# Patient Record
Sex: Male | Born: 1945 | Race: White | Hispanic: No | Smoking: Former smoker
Health system: Southern US, Community
[De-identification: ages and names within clinical notes are randomized; demographics above are authoritative.]

## PROBLEM LIST (undated history)

## (undated) DIAGNOSIS — F319 Bipolar disorder, unspecified: Secondary | ICD-10-CM

## (undated) DIAGNOSIS — G939 Disorder of brain, unspecified: Secondary | ICD-10-CM

## (undated) DIAGNOSIS — G4700 Insomnia, unspecified: Secondary | ICD-10-CM

## (undated) DIAGNOSIS — F8081 Childhood onset fluency disorder: Secondary | ICD-10-CM

## (undated) DIAGNOSIS — E039 Hypothyroidism, unspecified: Secondary | ICD-10-CM

## (undated) DIAGNOSIS — F1027 Alcohol dependence with alcohol-induced persisting dementia: Secondary | ICD-10-CM

## (undated) DIAGNOSIS — R251 Tremor, unspecified: Secondary | ICD-10-CM

## (undated) DIAGNOSIS — E890 Postprocedural hypothyroidism: Secondary | ICD-10-CM

## (undated) DIAGNOSIS — G40909 Epilepsy, unspecified, not intractable, without status epilepticus: Secondary | ICD-10-CM

## (undated) DIAGNOSIS — E512 Wernicke's encephalopathy: Secondary | ICD-10-CM

## (undated) HISTORY — DX: Epilepsy, unspecified, not intractable, without status epilepticus: G40.909

## (undated) HISTORY — DX: Disorder of brain, unspecified: G93.9

## (undated) HISTORY — PX: NO PAST SURGERIES: SHX2092

## (undated) HISTORY — DX: Wernicke's encephalopathy: E51.2

## (undated) HISTORY — DX: Postprocedural hypothyroidism: E89.0

## (undated) HISTORY — DX: Alcohol dependence with alcohol-induced persisting dementia: F10.27

## (undated) HISTORY — DX: Hypothyroidism, unspecified: E03.9

## (undated) HISTORY — DX: Insomnia, unspecified: G47.00

## (undated) HISTORY — DX: Tremor, unspecified: R25.1

## (undated) HISTORY — DX: Bipolar disorder, unspecified: F31.9

## (undated) HISTORY — DX: Childhood onset fluency disorder: F80.81

---

## 2007-09-03 ENCOUNTER — Inpatient Hospital Stay (HOSPITAL_COMMUNITY): Admission: EM | Admit: 2007-09-03 | Discharge: 2007-09-04 | Payer: Self-pay | Admitting: Emergency Medicine

## 2007-09-04 ENCOUNTER — Encounter (INDEPENDENT_AMBULATORY_CARE_PROVIDER_SITE_OTHER): Payer: Self-pay | Admitting: Gastroenterology

## 2007-09-13 ENCOUNTER — Inpatient Hospital Stay (HOSPITAL_COMMUNITY): Admission: EM | Admit: 2007-09-13 | Discharge: 2007-09-16 | Payer: Self-pay | Admitting: Emergency Medicine

## 2007-09-13 ENCOUNTER — Ambulatory Visit: Payer: Self-pay | Admitting: Internal Medicine

## 2007-09-16 ENCOUNTER — Ambulatory Visit: Payer: Self-pay | Admitting: Psychiatry

## 2007-09-20 ENCOUNTER — Emergency Department (HOSPITAL_COMMUNITY): Admission: EM | Admit: 2007-09-20 | Discharge: 2007-09-20 | Payer: Self-pay | Admitting: Emergency Medicine

## 2007-10-09 ENCOUNTER — Emergency Department (HOSPITAL_COMMUNITY): Admission: EM | Admit: 2007-10-09 | Discharge: 2007-10-10 | Payer: Self-pay | Admitting: Emergency Medicine

## 2010-04-20 ENCOUNTER — Emergency Department (HOSPITAL_COMMUNITY)
Admission: EM | Admit: 2010-04-20 | Discharge: 2010-04-20 | Payer: Self-pay | Source: Home / Self Care | Admitting: Emergency Medicine

## 2010-07-16 LAB — POCT I-STAT, CHEM 8
BUN: 15 mg/dL (ref 6–23)
Calcium, Ion: 1.17 mmol/L (ref 1.12–1.32)
Chloride: 100 mEq/L (ref 96–112)
Glucose, Bld: 79 mg/dL (ref 70–99)
HCT: 37 % — ABNORMAL LOW (ref 39.0–52.0)
TCO2: 25 mmol/L (ref 0–100)

## 2010-07-16 LAB — DIFFERENTIAL
Basophils Absolute: 0 10*3/uL (ref 0.0–0.1)
Eosinophils Relative: 0 % (ref 0–5)
Lymphocytes Relative: 22 % (ref 12–46)
Neutrophils Relative %: 65 % (ref 43–77)

## 2010-07-16 LAB — RAPID URINE DRUG SCREEN, HOSP PERFORMED
Barbiturates: NOT DETECTED
Benzodiazepines: NOT DETECTED
Cocaine: NOT DETECTED
Opiates: NOT DETECTED

## 2010-07-16 LAB — POCT CARDIAC MARKERS: Troponin i, poc: <0.05 ng/mL (ref 0.00–0.09)

## 2010-07-16 LAB — CBC
MCV: 92.9 fL (ref 78.0–100.0)
Platelets: 261 10*3/uL (ref 150–400)
RBC: 3.8 MIL/uL — ABNORMAL LOW (ref 4.22–5.81)
RDW: 12.6 % (ref 11.5–15.5)
WBC: 7 10*3/uL (ref 4.0–10.5)

## 2010-07-16 LAB — URINALYSIS, ROUTINE W REFLEX MICROSCOPIC
Glucose, UA: NEGATIVE mg/dL
pH: 8.5 — ABNORMAL HIGH (ref 5.0–8.0)

## 2010-07-16 LAB — HEPATIC FUNCTION PANEL
AST: 26 U/L (ref 0–37)
Albumin: 3.5 g/dL (ref 3.5–5.2)

## 2010-07-16 LAB — PROTIME-INR
INR: 1.06 (ref 0.00–1.49)
Prothrombin Time: 14 seconds (ref 11.6–15.2)

## 2010-07-16 LAB — ETHANOL: Alcohol, Ethyl (B): <5 mg/dL (ref 0–10)

## 2010-07-16 LAB — VALPROIC ACID LEVEL: Valproic Acid Lvl: 67 ug/mL (ref 50.0–100.0)

## 2010-09-18 NOTE — Discharge Summary (Signed)
NAME:  MORAD, TAL NO.:  [id number]   MEDICAL RECORD NO.:  [id number]          PATIENT TYPE:  INP   LOCATION:  3735                         FACILITY:  [facility]   PHYSICIAN:  Alvester Morin, M.D.  DATE OF BIRTH:  1946-05-06   DATE OF ADMISSION:  09/13/2007  DATE OF DISCHARGE:  09/16/2007                               DISCHARGE SUMMARY   DISCHARGE DIAGNOSES:  1. Seizure.  2. History of bipolar disorder, quiescent.  3. History of alcohol abuse.  4. 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  5. Diarrhea, chronic, likely secondary to pancreatic insufficiency.  6. Hypertension.  7. Hyperlipidemia.  8. Insomnia.   DISCHARGE MEDICATIONS:  1. Aspirin 81 mg daily.  2. Niacin 250 mg by mouth 1 today.  3. Seroquel 50 mg bedtime.  4. Pancrease MT 10 t.i.d. with meals.   DISPOSITION:  The patient will followup on October 08, 2007, at 1:30 p.m.  with Dr. Aida Puffer.   PROCEDURES PERFORMED:  1.EEG: normal in awake, drowsy and light sleep  states.  2.MRI of the brain showed no evidence of acute process and no specific  lesion to explain seizure. Chronic small vessel type changes.  3.CT scan of the head wasnegative for bleed with some atrophy and  nonspecific white matter changes.   CONSULTANTS:  Psychiatry, Anselm Jungling, MD.   BRIEF ADMITTING H&P:  Mr Heidrick is a 65 year old man with past  medical history of alcoholism with Wernicke's encephalopathy, bipolar  disorder and isolated seizure about 6 years prior to admission. He  presents with seizure activity witnessed by his son and daughter-in-law.  For the past 2 weeks, his family notes that he has been very talkative  and highly energetic.  The day of admission, he started stuttering and  was unintelligible.  He then started having jerking movements of his  limbs with loss of consciousness for 5 minutes. The patient is unable to  give any further history. The family denies recent illnesses, medication  changes, substance abuse, trauma.   PHYSICAL EXAMINATION:  VITAL SIGNS:  Temperature is 97.3, blood pressure  156/76, pulse 86, respirations 18, and he is satting 96% on room air.  GENERAL:  He is alert but disoriented.  NECK:  Supple.  LUNGS:  Good air movement and clear to auscultation.  HEART:  Regular rate and rhythm with no murmur, rubs, or gallop with  normal S1 and S2.  GI:  Nontender, nondistended, soft, and positive bowel sounds.  EXTREMITIES:  He has no edema.  SKIN:  Some sunburn.  PSYCH:  Very pleasant, smiling, but disoriented  NEURO:  Alert, pleasant, disoriented to person, place and time. Unable  to answer questions due to memory impairment. CN's II through XII  grossly intact.  Muscle strength 5/5 bilateral and symmetrical.  Deep  tendon reflex 2+.  Sensation is intact to light touch.  Finger-to-nose  was normal.   LABORATORY DATA:  On the day of admission, point-of-care cardiac care  markers were negative.  His sodium was 133, potassium 3.8, chloride 99,  bicarb 22, BUN 9, creatinine 0.9, and glucose 109.  He  has a gap of 10.  Bilirubin 0.9, alkaline phosphatase is 54, AST 28, ALT 22, protein 6.8,  and albumin 3.9.  White blood cells 11.5, ANC 8.6, hemoglobin 12.9, and  MCV 89.1.  Chest x-ray showed no acute process.   BRIEF HOSPITAL COURSE:  1. Seizure. The patient was admitted to the telemetry unit.  CT scan      was obtained that showed results as above.  Because of high      probability for any kind of stroke, an MRI was obtained that showed      results to be negative with results as above.  EEG was obtained and      was normal.  There were no electrolyte abnormalities and no signs      of infection.  No signs of arrhythmia by telemetry. He had no      further seizure activity during the hospitalizaion. Therefore, it      was decided not to start an antiepileptic at this time. The patient      will follow up with his PCP.  2. History of bipolar disorder,  quiescent.  Psychiatry was consulted      and felt that the patient's bipolar disorder was quiescent at this      time. He did not recommend any medications for bipolar disorder.  3. Insomnia. The patient has difficulty with sleep. Therefore, at the      recommendation of psychiatry, Seroquel was started at bedtime.  4. History of alcohol abuse with Wernicke's encephalopathy. The      patient is no longer drinking. However, he has severely impaired      cognitive function and memory loss.  5. Low HDL.  Niacin was started.  This will be increased by his PCP.  6. Diarrhea, chronic.  This was thought to be secondary to pancreatic      insufficiency from alcohol use.  He was started on Pancrease and      his diarrhea improved.  Giardia and ova and parasites are pending      at this time.  He had previous treatment with Flagyl for Giardia      but his diarrhea has persisted.  7. Hypertension.  No blood pressure medication was started during his      hospital stay.  His highest blood pressure in the hospital was 145      on one occasion.  This can be followed as an outpatient.   DISCHARGE VITAL SIGNS:  On the day of discharge, his temperature was  98.4, blood pressure 125/70, pulse 74, respirations 18, and oxygen  saturation was 99% on room air.   DISCHARGE LABORATORY DATA:  Labs on the day of discharge showed sodium  135, potassium 4.5, chloride 101, bicarb 27, glucose 104, BUN 3,  creatinine 0.9, calcium 9.0.  White blood cells 8.9, hemoglobin of 12.2,  and platelets of 318.      Marinda Elk, M.D.  Electronically Signed      Alvester Morin, M.D.  Electronically Signed    AF/MEDQ  D:  09/16/2007  T:  09/17/2007  Job:  [id number]   cc:   Aida Puffer

## 2010-09-18 NOTE — Consult Note (Signed)
NAME:  TIRAS, BIANCHINI NO.:  [id number]   MEDICAL RECORD NO.:  [id number]          PATIENT TYPE:  INP   LOCATION:  3735                         FACILITY:  [facility]   PHYSICIAN:  Anselm Jungling, MD  DATE OF BIRTH:  [birth year]   DATE OF CONSULTATION:  09/16/2007  DATE OF DISCHARGE:                                 CONSULTATION   IDENTIFYING DATA AND REASON FOR REFERRAL:  Dr. Corrie Dandy Phifer asked that I  see this 65 year old divorced white gentleman whom is she is treating  here at [facility] due to seizures.  He has a psychiatric  history and a history of alcoholism.  Psychiatric consultation is  requested to assess mental status and make recommendations.   HISTORY OF THE PRESENTING PROBLEMS:  The following information is  obtained from Dr. Harvie Junior, the [facility] chart, the patient, and his  son, Thayer Ohm, and his with wife, Inetta Fermo, who are powers of attorney.   The patient is 65 years old and lives currently in [place] with [place]  and [place].  He has been separated for several years.  He is alcohol-  dependent, but not drinking for several years now.  Nonetheless, he  appears to have evidence of Wernicke's encephalopathy, with CT imaging  showing generalized atrophy, and nonspecific white matter changes.  For  this reason, he has a power of attorney.  His son and daughter-in-law  have been assisting him around issues pertaining to divorce, finances,  and providing him a place to live.  He has severely impaired cognition  and memory.   The patient has been under the care of Dr. Clarene Duke, a family doctor, for  the past 6 to 7 months since the patient has been living in [place]  with his son.  He is currently on Zoloft 100 mg daily.   In addition to his alcohol related problems and dependence, he also has  a history of diagnosis of bipolar disorder.  When he first came to stay  with his son and daughter-in-law, he was on numerous medications, and  was quite  obtunded much of the time, sleeping as much as 23 hours per  day.  One of these medications was Depakote, and it was can discontinued  8 months ago.  Following the discontinuation of Depakote, he has been  functioning a much higher level.  He has been more active, he has lost  excess weight, and his mood has been very good.  He is always smiling.  He does have some problems sleeping, and when he gets particularly  tired, he goes through episodes where he is mildly agitated and has  episodes of stuttering.  Also, since the discontinuation of Depakote,  there has been more in the way of seizure activity, which was the basis  for this admission.  According to his children, the seizures began  approximately 6 to 7 years ago, around the time that he began drinking  again, after having been sober for about 4 years.   His last inpatient psychiatric stay was 6 years ago, in the state of  [place].  That  stay was also associated with the onset of seizures.   Recently, difficulty with sleep, and insomnia has been the most  consistent difficulty that they have seen with the patient.   MENTAL STATUS AND OBSERVATIONS:  The patient is a well-nourished,  normally-developed adult male who is alert, awake, but disoriented.  He  is very pleasant, friendly, and smiles most the time that we are  conducting the interview.  He indicates that he prefers that his son and  daughter-in-law be present, and he actually indicates he prefers that  they answer for him, since he has difficulty with informational  questions anyway.  He did not make any delusional statements.  He  appears to be in quite good spirits, and very good natured.  He clearly  lacks mental capacity for medical decision making.   IMPRESSION:  The patient is a 65 year old divorced male with a history  of chronic alcoholism, Wernicke's encephalopathy, and seizures.  He also  has a history of bipolar disorder.   At this present time, he appears to  be doing well with respect to any  mood related issues on his current regimen of Zoloft 100 mg daily.  There does not appear to be any indication for any anti-manic, mood-  stabilizing, or antipsychotic medication at this time.  He does have  ongoing problems with insomnia, and when he does not get enough sleep,  he does not function at as high a level.   DIAGNOSTIC IMPRESSION:  AXIS I:  1. Alcohol dependence, in remission.  2. Wernicke's encephalopathy.  3. History of bipolar disorder NOS.  AXIS II:  Deferred.  AXIS III:  History of seizures.  AXIS IV:  Stressors severe.  AXIS V:  GAF 25.   RECOMMENDATIONS:  I agree completely with his current management here at  [facility].  Prior to discharge, his physician may want to  consider the possibility of a trial of medication to assist in  stabilizing sleep.  Medications to consider would be low-dose Risperdal,  low-dose Seroquel, and, if the patient is to be on any ongoing  anticonvulsant therapy, it would be possibly serendipitous for that  medication to be given all at bedtime to assist with sleep as well.   The patient will continue to be followed by Dr. Clarene Duke in [place]  area.  I discussed with the patient and his children the fact that,  although his bipolar disorder appears to be quiescent at this time,  there is always the possibility that it could become more active again,  at which time consultation with a psychiatrist would be advisable.  They  agreed with this.   Thank you for involving me in this patient's care.  I have no other  specific recommendations at this time.  Cell phone [phone].      Anselm Jungling, MD  Electronically Signed     SPB/MEDQ  D:  09/16/2007  T:  09/16/2007  Job:  [id number]

## 2010-09-18 NOTE — H&P (Signed)
NAME:  Trevor Cook, Trevor Cook NO.:  [id number]   MEDICAL RECORD NO.:  [id number]           PATIENT TYPE:   LOCATION:                                 FACILITY:   PHYSICIAN:  Shirley Friar, MDDATE OF BIRTH:  [birth year]   DATE OF ADMISSION:  DATE OF DISCHARGE:                              HISTORY & PHYSICAL   PROBLEM:  GI bleed.   HISTORY OF PRESENT ILLNESS:  Mr. Trevor Cook is a 65 year old white male  with a history of mental retardation who saw his primary care doctor,  Dr. Aida Puffer today with his caretaker who is the daughter-in-law,  Charlee Whitebread.  He has been having black-colored diarrhea for the last  10 days without any associated nausea, vomiting, or abdominal pain.  He  has no previous history of ulcers or GI bleeding in the past.  He is  hemodynamically stable with hemoglobin of 14.6 at Dr. Fredirick Maudlin office  and was sent down to [facility] for further evaluation.  The  patient and family denied any NSAID use.  No history of any bright red  blood per rectum.   PAST MEDICAL HISTORY:  1. History of depression.  2. Hyperlipidemia.  3. Mental retardation.   MEDICATIONS:  1. Vitamin D 50,000 units once a week.  2. Sertraline 100 mg p.o. daily.  3. Lipitor 10 mg p.o. daily.  4. Depakote 500 mg nightly.  5. Mirtazapine 30 mg p.o. daily.   ALLERGIES:  No known drug allergies.   FAMILY HISTORY:  Noncontributory.   SOCIAL HISTORY:  Former smoker, denies alcohol.   REVIEW OF SYSTEMS:  Negative from the GI standpoint except as stated  above.   PHYSICAL EXAMINATION:  VITAL SIGNS: Temperature 98.1, pulse 89, blood  pressure 133/104, on recheck 155/97, O2 sat 97% on room air.  GENERAL: Alert, in no acute distress.  ABDOMEN: Benign.  RECTUM: Dark brown stool on rectal check.   LABORATORY DATA:  White blood count 11.2, hemoglobin 15.4, and platelet  count 330,000.  Sodium 135, potassium 3.6, chloride 102, CO2 of 18,  glucose 113, BUN 23, and  creatinine 1.08.  Liver tests within normal  limits.   IMPRESSION:  A 65 year old white male with 10 days of reported black-  colored diarrhea concerning for upper gastrointestinal source.  The  patient will be admitted to the hospital for observation and will  undergo upper endoscopy in the a.m. to look for evidence of peptic ulcer  disease.  The patient will be on clear liquid diet, n.p.o. at the  midnight, and will be on IV Protonix 40 mg IV q. 12 hours.      Shirley Friar, MD  Electronically Signed     VCS/MEDQ  D:  09/03/2007  T:  09/04/2007  Job:  [id number]   cc:   Aida Puffer

## 2010-09-18 NOTE — Op Note (Signed)
NAME:  Trevor Cook, READING NO.:  [id number]   MEDICAL RECORD NO.:  [id number]           PATIENT TYPE:   LOCATION:                                 FACILITY:   PHYSICIAN:  Graylin Shiver, M.D.   DATE OF BIRTH:  December 04, 1945   DATE OF PROCEDURE:  DATE OF DISCHARGE:                               OPERATIVE REPORT   INDICATIONS FOR PROCEDURE:  Black heme-positive stool.   Informed consent was obtained after explanation of the risks of  bleeding, infection, and perforation.   PREMEDICATIONS:  1. Fentanyl 75 mcg IV.  2. Versed 7.5 mg IV.   PROCEDURE:  With the patient in the left lateral decubitus position, the  Pentax gastroscope was inserted into the oropharynx and passed into the  esophagus.  It was advanced down the esophagus then into the stomach and  into the duodenum.  The second portion of the duodenum looked normal.  In the bulb of the duodenum just inside the rim of the pyloric channel  on the bulb side, there were some nodules noted.  The etiology of these  are unclear.  I am not sure if this represent hypertrophied Brunner  glands or some other pathology.  Biopsies were obtained.  The body and  antrum of the stomach looked normal.  The fundus of the stomach looked  normal.  In the distal esophagus, there was 1 superior projection of  columnar-appearing mucosa coming up off the EG junction which was  biopsied to rule out Barrett.  The esophagus looked normal otherwise.  He tolerated the procedure well without complications.   IMPRESSION:  1. Rule out Barrett esophagus, biopsy pending.  2. Nodules in duodenum as described above, etiology unclear, biopsies      pending.  3. No evidence of active bleeding or findings to suggest a source for      bleeding.   PLAN:  The patient is going to be discharged today.  I will have him  follow up with Dr. Bosie Clos next week.  He should have a colonoscopy.  This can be done as an outpatient.     ______________________________  Graylin Shiver, M.D.     SFG/MEDQ  D:  09/04/2007  T:  09/05/2007  Job:  [id number]   cc:   Shirley Friar, MD

## 2010-09-18 NOTE — Procedures (Signed)
EEG NUMBER:  [id number].   REQUESTING PHYSICIAN:  Alvester Morin, MD   CLINICAL HISTORY:  A 65 year old man admitted with new-onset seizure.  EEG is performed for evaluation.  This is routine EEG done with photic  stimulation, but not hyperventilation.  The patient is described as  awake and drowsy.   DESCRIPTION:  The dominant rhythm in this tracing is a moderate  amplitude alpha rhythm of 10 Hz, which predominates posteriorly, appears  without abnormal asymmetry, and attenuates with eye opening and closing.  Abundant low-amplitude fast activity along with some muscle artifact is  seen in the frontal and central regions bilaterally.  No focal slowing  is noted, and no epileptiform discharges are seen.  Drowsiness occurs  naturally as evidenced by fragmentation in the background of generalized  attenuation of rhythms.  Rarely, sleep spindles are seen, but the  patient never stays in sleep stage long enough to achieve clear stage II  sleep.  Numerous arousals from light sleep are noted, usually related to  movement.  Photic stimulation did not produce significant driving  responses.  Hyperventilation was not performed.  Single-channel devoted  EKG revealed sinus rhythm throughout with a rate of approximately 72  beats per minute.   CONCLUSIONS:  Normal study in awake, drowsy, and light sleep states.      Michael L. Thad Ranger, M.D.  Electronically Signed     ZOX:WRUE  D:  09/15/2007 17:12:02  T:  09/16/2007 05:23:11  Job #:  [id number]

## 2010-09-18 NOTE — Consult Note (Signed)
NAME:  Trevor Cook, Trevor Cook NO.:  [id number]   MEDICAL RECORD NO.:  [id number]          PATIENT TYPE:  EMS   LOCATION:  MAJO                         FACILITY:  [facility]   PHYSICIAN:  Herbie Saxon, MDDATE OF BIRTH:  November 26, 1945   DATE OF CONSULTATION:  10/09/2007  DATE OF DISCHARGE:  10/09/2007                                 CONSULTATION   PRIMARY CARE PHYSICIAN:  Dr. Aida Puffer.   REASON FOR CONSULTATION:  Medical evaluation.   PRESENTING COMPLAINT:  Agitation for 1 day.   HISTORY OF PRESENTING COMPLAINT:  This is a 65 year old male with  history of bipolar disorder, dementia, seizure disorder, who was brought  into the emergency unit, as staff had noticed that he has been having  rapid speech, jumping from 1 subject to another, and experiencing flight  of ideas.  The patient at present does not complain of any particular  symptoms.   PAST MEDICAL HISTORY:  As previously stated.   PAST SURGICAL HISTORY:  Fixation of right index finger many years ago.   SOCIAL HISTORY:  He is divorced.  He has 1 son as per him.  He gives a  history of heavy alcohol intake, a case of Budweiser daily for more than  40 years.  He also smokes heavily, 1 pack per day for more than 40  years.  He denies any use of illicit 4rugs.   FAMILY HISTORY:  Mother, diabetes.  Father, coronary artery disease.   ALLERGIES:  No known drug allergies.   MEDICATIONS:  Aspirin 81 mg daily, Amitriptyline, Keflex, Lasix, and  Depakote.   PHYSICAL EXAMINATION:  GENERAL:  He is an elderly man, not in acute  respiratory distress.  VITAL SIGNS:  Temperature is 97, pulse rate of 70.  After evaluation in  the emergency room, blood pressure was 146/110.  HEENT:  Pupils equal and reactive to light and accommodation.  Extraocular muscles are intact.  NECK:  Supple.  CHEST:  Clinically clear.  HEART:  Heart sounds 1 and 2, regular rate and rhythm.  ABDOMEN:  Benign.  JYN:WGNFA edema 2+.   Peripheral pulse is reduced.  OZH:YQMVHQI normal   ASSESSMENT:  1. Manic episode.  2. Hyponitremia.  3. Hypertension.  4. History of alcohol abuse.  5. History of anoxic encephalopathy.  6. History of seizure disorder.  7. Bipolar disorder.  8. History of dementia.   IV fluids, normal saline at 80 mL an hour with p.o. fluid restriction.  We will obtain a CT of brain, urine sodium level, urinalysis, serum uric  acid, serum osmolality, chest x-ray.  Start him on metoprolol 25 mg  daily, clonidine 0.1 mg daily p.r.n.  Continue with his amitriptyline 25  mg daily, Depakote 500 mg daily.  Check his Depakote level.  Given  seizure, fall, and aspiration precaution.  Continue his aspirin 81 mg  daily.  The patient will benefit from transfer to inpatient Psych Unit,  before had been Geriatric Psych Unit 2 months ago .  Ativan 2 mg IV q.6  h. p.r.n.  Put him on Thiamine 100 mg daily.  Note that  his urine  culture is positive.   LABORATORY DATA:  WBC 11.7, hematocrit 33, and platelet count of 380.  Sodium was 125, potassium 3.5, chloride 87, BUN 9, and creatinine 0.9.  Alcohol level less than 5.  Valproic acid level is 9.3.   Thanks for this consult.  We will follow with you.      Herbie Saxon, MD  Electronically Signed     MIO/MEDQ  D:  10/10/2007  T:  10/10/2007  Job:  [id number]   cc:   Aida Puffer

## 2011-01-29 LAB — COMPREHENSIVE METABOLIC PANEL
AST: 25
Albumin: 3.9
CO2: 18 — ABNORMAL LOW
Calcium: 9.5
Creatinine, Ser: 1.08
GFR calc Af Amer: >60
GFR calc non Af Amer: >60

## 2011-01-29 LAB — CBC
MCHC: 36
MCV: 89.4
Platelets: 330
RDW: 13.3

## 2011-01-30 LAB — DIFFERENTIAL
Basophils Relative: 2 — ABNORMAL HIGH
Eosinophils Absolute: 0.1
Eosinophils Relative: 1
Monocytes Absolute: 1.1 — ABNORMAL HIGH
Monocytes Relative: 10
Neutro Abs: 6.6

## 2011-01-30 LAB — URINALYSIS, ROUTINE W REFLEX MICROSCOPIC
Bilirubin Urine: NEGATIVE
Nitrite: NEGATIVE
Specific Gravity, Urine: 1.006
pH: 7

## 2011-01-30 LAB — COMPREHENSIVE METABOLIC PANEL
ALT: 35
AST: 46 — ABNORMAL HIGH
Albumin: 4.1
Alkaline Phosphatase: 61
Chloride: 99
GFR calc Af Amer: >60
Potassium: 3.9
Sodium: 133 — ABNORMAL LOW
Total Protein: 7.1

## 2011-01-30 LAB — CBC
Hemoglobin: 13.5
MCHC: 34.1
RBC: 4.4

## 2011-01-31 LAB — POCT I-STAT, CHEM 8
Chloride: 87 — ABNORMAL LOW
Glucose, Bld: 102 — ABNORMAL HIGH
HCT: 37 — ABNORMAL LOW
Potassium: 3.5

## 2011-01-31 LAB — URINALYSIS, ROUTINE W REFLEX MICROSCOPIC
Nitrite: NEGATIVE
Specific Gravity, Urine: 1.007
pH: 7.5

## 2011-01-31 LAB — BASIC METABOLIC PANEL
GFR calc Af Amer: >60
GFR calc non Af Amer: >60
Potassium: 3.8
Sodium: 133 — ABNORMAL LOW

## 2011-01-31 LAB — CBC
MCHC: 34.3
MCV: 91
RBC: 3.72 — ABNORMAL LOW
RDW: 13.6

## 2011-01-31 LAB — DIFFERENTIAL
Basophils Relative: 1
Eosinophils Absolute: 0
Monocytes Relative: 11
Neutrophils Relative %: 72

## 2011-01-31 LAB — RAPID URINE DRUG SCREEN, HOSP PERFORMED
Amphetamines: NOT DETECTED
Barbiturates: NOT DETECTED
Tetrahydrocannabinol: NOT DETECTED

## 2013-09-20 ENCOUNTER — Ambulatory Visit (INDEPENDENT_AMBULATORY_CARE_PROVIDER_SITE_OTHER): Payer: Medicare Other | Admitting: Neurology

## 2013-09-20 ENCOUNTER — Encounter: Payer: Self-pay | Admitting: Neurology

## 2013-09-20 ENCOUNTER — Other Ambulatory Visit: Payer: Self-pay | Admitting: Neurology

## 2013-09-20 VITALS — BP 130/78 | HR 64 | Ht 72.0 in | Wt 228.9 lb

## 2013-09-20 DIAGNOSIS — I10 Essential (primary) hypertension: Secondary | ICD-10-CM | POA: Insufficient documentation

## 2013-09-20 DIAGNOSIS — R259 Unspecified abnormal involuntary movements: Secondary | ICD-10-CM

## 2013-09-20 DIAGNOSIS — E039 Hypothyroidism, unspecified: Secondary | ICD-10-CM | POA: Insufficient documentation

## 2013-09-20 DIAGNOSIS — F04 Amnestic disorder due to known physiological condition: Secondary | ICD-10-CM | POA: Insufficient documentation

## 2013-09-20 DIAGNOSIS — R251 Tremor, unspecified: Secondary | ICD-10-CM

## 2013-09-20 MED ORDER — PROPRANOLOL HCL ER 80 MG PO CP24: 80.0000 mg | ORAL_CAPSULE | Freq: Every day | ORAL | Status: DC

## 2013-09-20 NOTE — Telephone Encounter (Signed)
Please advise on below note for refill on INDERAL LA 80 mg 24 hr cap

## 2013-09-20 NOTE — Patient Instructions (Addendum)
1. MRI brain with and without contrast 2. Start Propranolol ER 80mg  once a day 3. Stop Amlodopine 4. We will obtain recent bloodwork done by your PCP and will let you know if additional labs are needed

## 2013-09-20 NOTE — Progress Notes (Signed)
NEUROLOGY CONSULTATION NOTE  An Schnabel MRN: [id number] DOB: [birth year]  Referring provider: Dr. Aida Puffer Primary care provider: Dr. Aida Puffer  Reason for consult:  tremors  Dear Dr Clarene Duke:  Thank you for your kind referral of Trevor Cook for consultation of the above symptoms. Although his history is well known to you, please allow me to reiterate it for the purpose of our medical record. The patient was accompanied to the clinic by his son who also provides collateral information. Records and images were personally reviewed where available.  HISTORY OF PRESENT ILLNESS: This is a pleasant 68 year old right-handed man with a history of hypertension, hyperlipidemia, hypothyroidism, bipolar disorder, prior alcohol abuse with a diagnosis of Korsakoff's dementia in 1999.  His son reports that when he was admitted to the hospital at that time, he "may have had a stroke," and started having cognitive and personality changes.  He had generalized body weakness at that time, and used a walker for a year.  He was started on Depakote around 10 years ago, and has been taking Lithium and Risperal for at least 5-6 years.  His son reports that when he was tried to be weaned/taken off his medications in 2005 and 2008, he "went off the deep end," with paranoia and depression.  Since then, no new medications have been added.  They report the bilateral hand tremors started around age 58, with a low amplitude high frequency tremor affecting right more than left hand, mostly noticeable when he is trying to do something.  He uses the spoon more now because food would fall off his fork.  He spills coffee and needs to stop several times.  His writing is "terrible."  His daughter-in-law shaves him. When he tries to do something and the tremors are bad, he gets more frustrated, further worsening the tremors. They deny any tongue, jaw, or leg tremors.  He has balance problems, needing to "catch" himself  sometimes, no falls.  For a time he had bilateral feet tingling, which has resolved.  There is no family history of tremors.    He denies any headaches, dizziness, diplopia, dysarthria, dysphagia, focal numbness/tingling/weakness.  He has slight pain in the right side of his neck.  He has IBS, no bowel/bladder incontinence.  They report short-term memory problems since 1999, he does not drive or cook, he has been living with his son.  He had stopped alcohol in 1999.  They deny any prior history of seizures, and are unsure about the diagnosis of Epilepsy on his chart.  When he was taken off medications twice and would be paranoid and awake for several days, his son reported some body stiffening, but felt this was due to exhaustion. He denies any further seizures or seizure-like symptoms since then.  Laboratory Data: none available for review at this time  PAST MEDICAL HISTORY: Past Medical History  Diagnosis Date  . Bipolar 1 disorder   . Other postablative hypothyroidism   . Alcohol-induced persisting dementia   . Unspecified condition of brain     PAST SURGICAL HISTORY: History reviewed. No pertinent past surgical history.  MEDICATIONS: Depakote 500mg  TID Lithium 300mg  TID Risperal 1mg  qhs Cogentin 1mg  BID Norvasc 5mg  daily Chlorthalidone    No current facility-administered medications on file prior to visit.    ALLERGIES: No Known Allergies  FAMILY HISTORY: History reviewed. No pertinent family history.  SOCIAL HISTORY: History   Social History  . Marital Status: Single    Spouse Name: N/A  Number of Children: N/A  . Years of Education: N/A   Occupational History  . Not on file.   Social History Main Topics  . Smoking status: Current Some Day Smoker  . Smokeless tobacco: Not on file  . Alcohol Use: No  . Drug Use: No  . Sexual Activity: Not on file   Other Topics Concern  . Not on file   Social History Narrative  . No narrative on file    REVIEW OF  SYSTEMS: Constitutional: No fevers, chills, or sweats, no generalized fatigue, change in appetite Eyes: No visual changes, double vision, eye pain Ear, nose and throat: No hearing loss, ear pain, nasal congestion, sore throat Cardiovascular: No chest pain, palpitations Respiratory:  No shortness of breath at rest or with exertion, wheezes GastrointestinaI: No nausea, vomiting, diarrhea, abdominal pain, fecal incontinence Genitourinary:  No dysuria, urinary retention or frequency Musculoskeletal:  + neck pain, no back pain Integumentary: No rash, pruritus, skin lesions Neurological: as above Psychiatric: No depression, insomnia, anxiety Endocrine: No palpitations, fatigue, diaphoresis, mood swings, change in appetite, change in weight, increased thirst Hematologic/Lymphatic:  No anemia, purpura, petechiae. Allergic/Immunologic: no itchy/runny eyes, nasal congestion, recent allergic reactions, rashes  PHYSICAL EXAM: Filed Vitals:   09/20/13 0936  BP: 130/78  Pulse: 64   General: No acute distress, full facial expressions, no masked facies, normal blink rate Head:  Normocephalic/atraumatic Eyes: Fundoscopic exam shows bilateral sharp discs, no vessel changes, exudates, or hemorrhages Neck: supple, no paraspinal tenderness, full range of motion Back: No paraspinal tenderness Heart: regular rate and rhythm Lungs: Clear to auscultation bilaterally. Vascular: No carotid bruits. Skin/Extremities: No rash, no edema Neurological Exam: Mental status: alert and oriented to person, place, and time, no dysarthria or aphasia, Fund of knowledge is appropriate.  Recent and remote memory are intact.  Attention and concentration are normal.    Able to name objects and repeat phrases. Cranial nerves: CN I: not tested CN II: pupils equal, round and reactive to light, visual fields intact, fundi unremarkable. CN III, IV, VI:  full range of motion, no nystagmus, no ptosis CN V: facial sensation  intact CN VII: upper and lower face symmetric CN VIII: hearing intact to finger rub CN IX, X: gag intact, uvula midline CN XI: sternocleidomastoid and trapezius muscles intact CN XII: tongue midline Bulk & Tone: normal, no cogwheeling, no fasciculations. Motor: 5/5 throughout with no pronator drift, good fine finger movements. Sensation: intact to light touch, cold, pin, vibration and joint position sense.  No extinction to double simultaneous stimulation.  Romberg test negative Deep Tendon Reflexes: +1 throughout, no ankle clonus Plantar responses: downgoing bilaterally Cerebellar: no incoordination on finger to nose with note of bilateral end point tremor, left > right Gait: narrow-based and steady, good arm swing with minimal right hand tremor when ambulating, mild difficulty with tandem walk but able Tremor: no clear resting tremor.  He has a high frequency low amplitude tremor when sitting with arms on his lap, however there appears to have some voluntary muscle contraction with this, appearing more as an action tremor rather than resting.  There is action>postural tremor bilaterally, left >right.  No wing-beating tremor, no jaw/head/leg tremors. Mild tremor with handwriting, no micrographia with spiral drawing (see attached). Note of increased tremor on the opposite hand when drawing spiral.  IMPRESSION: This is a pleasant 68 year old right-handed man with a history of hypertension, hyperlipidemia, hypothyroidism, bipolar disorder, prior alcohol abuse with diagnosis of Korsakoff's dementia, presenting with a 2-year  history of worsening tremors that are most noticeable with action.  On exam today he appears to have a tremor at rest and with distraction as seen with Parkinson's disease, however there are no other signs of Parkinsonism noted. He has action>postural high frequency low amplitude tremors, likely drug-induced (Depakote, Lithium, Risperdal can cause tremors). MRI brain with and without  contrast will be ordered to assess for underlying structural abnormality.  Bloodwork from his PCP will be requested for review, and additional labs will be ordered if needed.  We discussed treatment of drug-induced tremors would be withdrawal of offending agents, however his son is very concerned that with medication wean in the past, he would become very different. At this point, we have agreed to do a trial of Propranolol ER 80mg /day.  He is already on low dose Norvasc and chlorthalidone for HTN, and will stop the Norvasc and take Propranolol instead.  They will monitor BP with the medication changes.  He will follow-up in 2 months.  Thank you for allowing me to participate in the care of this patient. Please do not hesitate to call for any questions or concerns.   Patrcia DollyKaren Aquino, M.D.  CC: Dr. Aida PufferJames Little

## 2013-09-20 NOTE — Telephone Encounter (Signed)
Rx sent to pharmacy   

## 2013-10-04 ENCOUNTER — Ambulatory Visit (HOSPITAL_COMMUNITY): Admission: RE | Admit: 2013-10-04 | Payer: Medicare Other | Source: Ambulatory Visit

## 2013-10-18 ENCOUNTER — Ambulatory Visit (HOSPITAL_COMMUNITY)
Admission: RE | Admit: 2013-10-18 | Discharge: 2013-10-18 | Disposition: A | Discharge status: Home / Self Care | Payer: Medicare Other | Source: Ambulatory Visit | Attending: Neurology | Admitting: Neurology

## 2013-10-18 DIAGNOSIS — F0390 Unspecified dementia without behavioral disturbance: Secondary | ICD-10-CM | POA: Insufficient documentation

## 2013-10-18 DIAGNOSIS — R259 Unspecified abnormal involuntary movements: Secondary | ICD-10-CM | POA: Insufficient documentation

## 2013-10-18 DIAGNOSIS — R251 Tremor, unspecified: Secondary | ICD-10-CM

## 2013-10-18 DIAGNOSIS — R413 Other amnesia: Secondary | ICD-10-CM | POA: Insufficient documentation

## 2013-10-18 LAB — CREATININE, SERUM
Creatinine, Ser: 1.22 mg/dL (ref 0.50–1.35)
GFR calc Af Amer: 70 mL/min — ABNORMAL LOW (ref >90)
GFR, EST NON AFRICAN AMERICAN: 61 mL/min — AB (ref >90)

## 2013-10-18 MED ORDER — GADOBENATE DIMEGLUMINE 529 MG/ML IV SOLN
20.0000 mL | Freq: Once | INTRAVENOUS | Status: AC | PRN
  Administered 2013-10-18: 20 mL via INTRAVENOUS

## 2014-01-03 ENCOUNTER — Other Ambulatory Visit: Payer: Self-pay | Admitting: Neurology

## 2014-09-26 ENCOUNTER — Other Ambulatory Visit: Payer: Self-pay | Admitting: Neurology

## 2014-09-26 NOTE — Telephone Encounter (Signed)
Propranolol refill denied. Note to pharmacy that patient needs follow up appt.

## 2020-04-05 ENCOUNTER — Encounter: Payer: Self-pay | Admitting: *Deleted

## 2020-04-07 ENCOUNTER — Encounter: Payer: Self-pay | Admitting: Neurology

## 2020-04-07 ENCOUNTER — Other Ambulatory Visit: Payer: Self-pay

## 2020-04-07 ENCOUNTER — Ambulatory Visit (INDEPENDENT_AMBULATORY_CARE_PROVIDER_SITE_OTHER): Payer: Medicare Other | Admitting: Neurology

## 2020-04-07 VITALS — BP 153/68 | HR 55 | Ht 72.0 in | Wt 212.0 lb

## 2020-04-07 DIAGNOSIS — F04 Amnestic disorder due to known physiological condition: Secondary | ICD-10-CM | POA: Diagnosis not present

## 2020-04-07 DIAGNOSIS — F1011 Alcohol abuse, in remission: Secondary | ICD-10-CM | POA: Diagnosis not present

## 2020-04-07 DIAGNOSIS — R251 Tremor, unspecified: Secondary | ICD-10-CM

## 2020-04-07 NOTE — Progress Notes (Signed)
Chief Complaint  Patient presents with  . New Patient (Initial Visit)    [facility] EMG 4. Paper referral from Elisabeth Most, FNP for worsening tremor/stutter.  Marland Kitchen PCP    Aida Puffer, MD    HISTORICAL  Trevor Cook is a 74 year old male, seen in request by his primary care physician Dr. Enis Gash for evaluation of worsening tremor, stuttering, initial evaluation was on April 07, 2020.  I reviewed and summarized the referring note.  Past medical history Hypothyroidism, on supplement History of heavy drink quit since 1999, with a diagnosis of alcohol induced dementia  He currently lives with his son, able to take care of basic needs on daily function, dressing, feeding himself using bathroom, but is very forgetful, he has been treated for his cardiac medication years, this including Depakote DR 500 mg 3 times a day, Risperdal 1 mg every day, Xanax 1 mg twice a day, lithium 300 mg 3 times a day, Cogentin 1 mg twice a day  Per his son, in recent attempt to change from lithium to Zoloft without success, now is back on lithium 300 mg 3 times a day.  He has been on Depakote DR 500 mg 3 times a day since 1999  He is still noticed gradual worsening but for his shaking since 2017, gradually worse, to the point of difficulty using utensils,  I personally reviewed MRI of the brain without contrast in June 2015, no acute abnormality, mild atrophy, small vessel disease He gets laboratory issue, last check was in 2020   REVIEW OF SYSTEMS: Full 14 system review of systems performed and notable only for as above All other review of systems were negative.  ALLERGIES: No Known Allergies  HOME MEDICATIONS: Current Outpatient Medications  Medication Sig Dispense Refill  . amLODipine (NORVASC) 5 MG tablet Take 5 mg by mouth daily.    . benztropine (COGENTIN) 1 MG tablet Take 1 mg by mouth 2 (two) times daily.    . chlorthalidone (HYGROTON) 25 MG tablet Take 25 mg by mouth daily.    .  divalproex (DEPAKOTE) 500 MG DR tablet Take 500 mg by mouth 3 (three) times daily.    Marland Kitchen levothyroxine (SYNTHROID, LEVOTHROID) 50 MCG tablet Take 50 mcg by mouth daily before breakfast.    . lithium carbonate 300 MG capsule Take 300 mg by mouth 3 (three) times daily with meals.    . propranolol ER (INDERAL LA) 80 MG 24 hr capsule TAKE ONE CAPSULE BY MOUTH EVERY DAY 90 capsule 0  . risperiDONE (RISPERDAL) 1 MG tablet Take 1 mg by mouth at bedtime.     No current facility-administered medications for this visit.    PAST MEDICAL HISTORY: Past Medical History:  Diagnosis Date  . 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HCC)   . Bipolar 1 disorder (HCC)   . Cerebellar disorder   . Epilepsy (HCC)   . Hypothyroid   . Insomnia   . Other postablative hypothyroidism   . Stutter   . Tremor   . Unspecified condition of brain   . Wernicke's encephalopathy     PAST SURGICAL HISTORY: No past surgical history on file.  FAMILY HISTORY: No family history on file.  SOCIAL HISTORY: Social History   Socioeconomic History  . Marital status: Single    Spouse name: Not on file  . Number of children: Not on file  . Years of education: Not on file  . Highest education level: Not on file  Occupational History  . Not on  file  Tobacco Use  . Smoking status: Current Some Day Smoker  . Smokeless tobacco: Never Used  Substance and Sexual Activity  . Alcohol use: No  . Drug use: No  . Sexual activity: Not on file  Other Topics Concern  . Not on file  Social History Narrative   Lives with son   Social Determinants of Health   Financial Resource Strain:   . Difficulty of Paying Living Expenses: Not on file  Food Insecurity:   . Worried About Programme researcher, broadcasting/film/video in the Last Year: Not on file  . Ran Out of Food in the Last Year: Not on file  Transportation Needs:   . Lack of Transportation (Medical): Not on file  . Lack of Transportation (Non-Medical): Not on file  Physical Activity:   . Days  of Exercise per Week: Not on file  . Minutes of Exercise per Session: Not on file  Stress:   . Feeling of Stress : Not on file  Social Connections:   . Frequency of Communication with Friends and Family: Not on file  . Frequency of Social Gatherings with Friends and Family: Not on file  . Attends Religious Services: Not on file  . Active Member of Clubs or Organizations: Not on file  . Attends Banker Meetings: Not on file  . Marital Status: Not on file  Intimate Partner Violence:   . Fear of Current or Ex-Partner: Not on file  . Emotionally Abused: Not on file  . Physically Abused: Not on file  . Sexually Abused: Not on file     PHYSICAL EXAM   There were no vitals filed for this visit. Not recorded     There is no height or weight on file to calculate BMI.  PHYSICAL EXAMNIATION:  Gen: NAD, conversant, well nourised, well groomed                     Cardiovascular: Regular rate rhythm, no peripheral edema, warm, nontender. Eyes: Conjunctivae clear without exudates or hemorrhage Neck: Supple, no carotid bruits. Pulmonary: Clear to auscultation bilaterally   NEUROLOGICAL EXAM:  MENTAL STATUS: Speech/cognition: He is awake, oriented to his name, but not birthday, stuttering of the speech, word finding difficulties  CRANIAL NERVES: CN II: Visual fields are full to confrontation. Pupils are round equal and briskly reactive to light. CN III, IV, VI: extraocular movement are normal. No ptosis. CN V: Facial sensation is intact to light touch CN VII: Face is symmetric with normal eye closure  CN VIII: Hearing is normal to causal conversation. CN IX, X: Phonation is normal. CN XI: Head turning and shoulder shrug are intact  MOTOR: Moving 4 extremities without difficulty, profound bilateral upper extremity posturing tremor, no significant rigidity or weakness noted  REFLEXES: Reflexes are present and symmetric  SENSORY: Withdrawal to  pain  COORDINATION: There is no trunk or limb dysmetria noted.  GAIT/STANCE: He can push up from sitting position, mildly unsteady   DIAGNOSTIC DATA (LABS, IMAGING, TESTING) - I reviewed patient records, labs, notes, testing and imaging myself where available.   ASSESSMENT AND PLAN  Trevor Cook is a 74 y.o. male   Worsening bilateral upper extremity action tremor  In the setting of long-term Depakote use  This is most likely related to his long-term Depakote use  Also have a history of heavy alcohol abuse, carries a diagnosis of alcohol-induced chronic encephalitis  MRI of the brain  Laboratory evaluations including Depakote level  Levert Feinstein, M.D. Ph.D.  [facility] [address], [address] [place], [place] [address] Ph: [phone] Fax: [phone]  CC:  Elisabeth Most, FNP [address]  [place] [address]

## 2020-04-08 LAB — TSH: TSH: 2.6 u[IU]/mL (ref 0.450–4.500)

## 2020-04-08 LAB — COMPREHENSIVE METABOLIC PANEL
ALT: 22 IU/L (ref 0–44)
AST: 26 IU/L (ref 0–40)
Albumin/Globulin Ratio: 1.8 (ref 1.2–2.2)
Albumin: 4.1 g/dL (ref 3.7–4.7)
Alkaline Phosphatase: 53 IU/L (ref 44–121)
BUN/Creatinine Ratio: 21 (ref 10–24)
BUN: 18 mg/dL (ref 8–27)
Bilirubin Total: 0.3 mg/dL (ref 0.0–1.2)
CO2: 26 mmol/L (ref 20–29)
Calcium: 9.9 mg/dL (ref 8.6–10.2)
Chloride: 103 mmol/L (ref 96–106)
Creatinine, Ser: 0.87 mg/dL (ref 0.76–1.27)
GFR calc Af Amer: 100 mL/min/{1.73_m2} (ref >59)
GFR calc non Af Amer: 86 mL/min/{1.73_m2} (ref >59)
Globulin, Total: 2.3 g/dL (ref 1.5–4.5)
Glucose: 99 mg/dL (ref 65–99)
Potassium: 5.5 mmol/L — ABNORMAL HIGH (ref 3.5–5.2)
Sodium: 140 mmol/L (ref 134–144)
Total Protein: 6.4 g/dL (ref 6.0–8.5)

## 2020-04-08 LAB — CBC WITH DIFFERENTIAL
Basophils Absolute: 0.1 10*3/uL (ref 0.0–0.2)
Basos: 1 %
EOS (ABSOLUTE): 0.2 10*3/uL (ref 0.0–0.4)
Eos: 2 %
Hematocrit: 36 % — ABNORMAL LOW (ref 37.5–51.0)
Hemoglobin: 12.3 g/dL — ABNORMAL LOW (ref 13.0–17.7)
Immature Grans (Abs): 0 10*3/uL (ref 0.0–0.1)
Immature Granulocytes: 0 %
Lymphocytes Absolute: 2.4 10*3/uL (ref 0.7–3.1)
Lymphs: 25 %
MCH: 33 pg (ref 26.6–33.0)
MCHC: 34.2 g/dL (ref 31.5–35.7)
MCV: 97 fL (ref 79–97)
Monocytes Absolute: 0.9 10*3/uL (ref 0.1–0.9)
Monocytes: 9 %
Neutrophils Absolute: 5.9 10*3/uL (ref 1.4–7.0)
Neutrophils: 63 %
RBC: 3.73 x10E6/uL — ABNORMAL LOW (ref 4.14–5.80)
RDW: 11.6 % (ref 11.6–15.4)
WBC: 9.4 10*3/uL (ref 3.4–10.8)

## 2020-04-08 LAB — VITAMIN B12: Vitamin B-12: 663 pg/mL (ref 232–1245)

## 2020-04-08 LAB — VALPROIC ACID LEVEL: Valproic Acid Lvl: 66 ug/mL (ref 50–100)

## 2020-04-08 LAB — RPR: RPR Ser Ql: NONREACTIVE

## 2020-04-08 LAB — FOLATE: Folate: 19.7 ng/mL (ref >3.0)

## 2020-04-08 LAB — LITHIUM LEVEL: Lithium Lvl: 0.9 mmol/L (ref 0.5–1.2)

## 2020-04-10 ENCOUNTER — Telehealth: Payer: Self-pay | Admitting: Neurology

## 2020-04-10 NOTE — Telephone Encounter (Signed)
Medicare/medicaid order sent to GI. No auth they will reach out to the patient to schedule.  °

## 2020-04-10 NOTE — Telephone Encounter (Signed)
Please call patient, laboratory evaluation showed mild elevated potassium, this can be related to the handling of the blood sample, has unknown clinical significance  Normal Depakote level 66, lithium level 0.9, CBC showed mild anemia with hemoglobin of 12.3,  Rest of the laboratory evaluation showed normal folic acid, B12, RPR, TSH.

## 2020-04-10 NOTE — Telephone Encounter (Signed)
I spoke to the patient and expressed understanding of his lab findings. He already has his follow up scheduled with our office and will keep this appt.

## 2020-04-24 ENCOUNTER — Ambulatory Visit
Admission: RE | Admit: 2020-04-24 | Discharge: 2020-04-24 | Disposition: A | Discharge status: Home / Self Care | Payer: Medicare Other | Source: Ambulatory Visit | Attending: Neurology | Admitting: Neurology

## 2020-04-24 ENCOUNTER — Other Ambulatory Visit: Payer: Self-pay

## 2020-04-24 DIAGNOSIS — F1011 Alcohol abuse, in remission: Secondary | ICD-10-CM

## 2020-04-24 DIAGNOSIS — R251 Tremor, unspecified: Secondary | ICD-10-CM

## 2020-04-24 DIAGNOSIS — F04 Amnestic disorder due to known physiological condition: Secondary | ICD-10-CM

## 2020-04-25 ENCOUNTER — Telehealth: Payer: Self-pay | Admitting: Neurology

## 2020-04-25 NOTE — Telephone Encounter (Signed)
Left messages, on both numbers, requesting a return call. Provided our office hours.

## 2020-04-25 NOTE — Telephone Encounter (Signed)
   IMPRESSION: This MRI of the brain without contrast shows the following: 1.   Moderate generalized cortical atrophy, progressed compared to the 2015 MRI. 2.   Extensive T2/FLAIR hyperintense foci in the hemispheres consistent with moderate chronic microvascular ischemic change, progressed compared to the 2015 MRI. 3.   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  This has occurred since the 2015 MRI but is not acute. 4.   Mild chronic sinusitis. 5.   No acute findings.   Please call patient, MRI of the brain showed progression of generalized atrophy, supratentorium small vessel disease in addition, there was a new lacunar infarction in the left inferior cerebellar hemisphere, that is new since 2015,

## 2020-04-26 ENCOUNTER — Encounter: Payer: Self-pay | Admitting: *Deleted

## 2020-04-26 ENCOUNTER — Telehealth: Payer: Self-pay | Admitting: Neurology

## 2020-04-26 MED ORDER — LAMOTRIGINE 25 MG PO TABS: ORAL_TABLET | ORAL | 0 refills | Status: DC

## 2020-04-26 MED ORDER — LAMOTRIGINE 100 MG PO TABS: 100.0000 mg | ORAL_TABLET | Freq: Two times a day (BID) | ORAL | 11 refills | Status: DC

## 2020-04-26 NOTE — Telephone Encounter (Signed)
Pt's son called wanting to speak to the RN about the medication that he was to change out. He states that he forgot the process that was explained to him. Please advise.

## 2020-04-26 NOTE — Telephone Encounter (Signed)
Please see other note from today for further information.

## 2020-04-26 NOTE — Telephone Encounter (Signed)
Week Lamotrigine Depakote DR 500mg   1st week 25mg  twice a day 1/1/1  2nd week 25mg x2 tab twice aday 1/0/1  3rd week 25mg x3tab twice aday 0/0/1  4th week 100mg  twice a day 0/0/0     Meds ordered this encounter  Medications  . lamoTRIgine (LAMICTAL) 25 MG tablet    Sig: 1 tab bid x one week 2 tab bid x 2nd week 3 tab bid x 3rd week    Dispense:  84 tablet    Refill:  0  . lamoTRIgine (LAMICTAL) 100 MG tablet    Sig: Take 1 tablet (100 mg total) by mouth 2 (two) times daily.    Dispense:  60 tablet    Refill:  11

## 2020-04-26 NOTE — Telephone Encounter (Signed)
I have called the patient's son who verbalized understanding of the plan below. He knows to call us for any signs of a rash.  He would also like for the chart below to be emailed to him. He is in the process of setting up the mychart account.

## 2020-04-26 NOTE — Telephone Encounter (Signed)
I spoke to this son who verbalized understanding of his father's MRI findings.  Reports him being on divalproex since 1999. His son wonders if any medication changes should be made in light of him having a stroke.  Says his tremors are so bad that he can barely eat despite having specialized utensils. Also, his memory has declined.   Son Trevor Cook [phone] (Needs a call back as soon as possible. 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

## 2020-04-26 NOTE — Telephone Encounter (Signed)
Mychart message sent.

## 2020-04-26 NOTE — Telephone Encounter (Signed)
I was unable to reach the patient's son but I spoke to his dgt-in-law on DPR [address]). She wrote the medication instructions down and repeated them back to me correctly. She is also aware the chart has been sent to the patient's mychart account.

## 2020-04-26 NOTE — Telephone Encounter (Signed)
Pt returned phone call.  

## 2020-04-26 NOTE — Addendum Note (Signed)
Addended by: Levert Feinstein on: 04/26/2020 09:44 AM   Modules accepted: Orders

## 2020-05-12 ENCOUNTER — Emergency Department (HOSPITAL_COMMUNITY): Payer: Medicare Other

## 2020-05-12 ENCOUNTER — Inpatient Hospital Stay (HOSPITAL_COMMUNITY)
Admission: EM | Admit: 2020-05-12 | Discharge: 2020-05-17 | DRG: 091 | Disposition: A | Discharge status: Skilled Nursing Facility | Payer: Medicare Other | Attending: Internal Medicine | Admitting: Internal Medicine

## 2020-05-12 ENCOUNTER — Encounter (HOSPITAL_COMMUNITY): Payer: Self-pay

## 2020-05-12 DIAGNOSIS — Z79890 Hormone replacement therapy (postmenopausal): Secondary | ICD-10-CM

## 2020-05-12 DIAGNOSIS — G9341 Metabolic encephalopathy: Secondary | ICD-10-CM | POA: Diagnosis present

## 2020-05-12 DIAGNOSIS — F0390 Unspecified dementia without behavioral disturbance: Secondary | ICD-10-CM | POA: Diagnosis present

## 2020-05-12 DIAGNOSIS — G928 Other toxic encephalopathy: Principal | ICD-10-CM | POA: Diagnosis present

## 2020-05-12 DIAGNOSIS — G40909 Epilepsy, unspecified, not intractable, without status epilepticus: Secondary | ICD-10-CM | POA: Diagnosis present

## 2020-05-12 DIAGNOSIS — I10 Essential (primary) hypertension: Secondary | ICD-10-CM | POA: Diagnosis present

## 2020-05-12 DIAGNOSIS — Z66 Do not resuscitate: Secondary | ICD-10-CM | POA: Diagnosis present

## 2020-05-12 DIAGNOSIS — G4700 Insomnia, unspecified: Secondary | ICD-10-CM | POA: Diagnosis present

## 2020-05-12 DIAGNOSIS — G2119 Other drug induced secondary parkinsonism: Secondary | ICD-10-CM | POA: Diagnosis present

## 2020-05-12 DIAGNOSIS — E871 Hypo-osmolality and hyponatremia: Secondary | ICD-10-CM | POA: Diagnosis not present

## 2020-05-12 DIAGNOSIS — Z20822 Contact with and (suspected) exposure to covid-19: Secondary | ICD-10-CM | POA: Diagnosis present

## 2020-05-12 DIAGNOSIS — Z8673 Personal history of transient ischemic attack (TIA), and cerebral infarction without residual deficits: Secondary | ICD-10-CM

## 2020-05-12 DIAGNOSIS — Z809 Family history of malignant neoplasm, unspecified: Secondary | ICD-10-CM

## 2020-05-12 DIAGNOSIS — Z79899 Other long term (current) drug therapy: Secondary | ICD-10-CM

## 2020-05-12 DIAGNOSIS — Z87891 Personal history of nicotine dependence: Secondary | ICD-10-CM

## 2020-05-12 DIAGNOSIS — W19XXXA Unspecified fall, initial encounter: Secondary | ICD-10-CM | POA: Diagnosis present

## 2020-05-12 DIAGNOSIS — G9340 Encephalopathy, unspecified: Secondary | ICD-10-CM | POA: Diagnosis present

## 2020-05-12 DIAGNOSIS — R69 Illness, unspecified: Secondary | ICD-10-CM

## 2020-05-12 DIAGNOSIS — F1097 Alcohol use, unspecified with alcohol-induced persisting dementia: Secondary | ICD-10-CM | POA: Diagnosis present

## 2020-05-12 DIAGNOSIS — R251 Tremor, unspecified: Secondary | ICD-10-CM | POA: Diagnosis present

## 2020-05-12 DIAGNOSIS — E039 Hypothyroidism, unspecified: Secondary | ICD-10-CM | POA: Diagnosis present

## 2020-05-12 DIAGNOSIS — Z8249 Family history of ischemic heart disease and other diseases of the circulatory system: Secondary | ICD-10-CM

## 2020-05-12 DIAGNOSIS — G0490 Encephalitis and encephalomyelitis, unspecified: Secondary | ICD-10-CM | POA: Diagnosis present

## 2020-05-12 DIAGNOSIS — F319 Bipolar disorder, unspecified: Secondary | ICD-10-CM | POA: Diagnosis present

## 2020-05-12 DIAGNOSIS — E890 Postprocedural hypothyroidism: Secondary | ICD-10-CM | POA: Diagnosis present

## 2020-05-12 DIAGNOSIS — F04 Amnestic disorder due to known physiological condition: Secondary | ICD-10-CM | POA: Diagnosis present

## 2020-05-12 DIAGNOSIS — F1096 Alcohol use, unspecified with alcohol-induced persisting amnestic disorder: Secondary | ICD-10-CM | POA: Diagnosis not present

## 2020-05-12 DIAGNOSIS — E512 Wernicke's encephalopathy: Secondary | ICD-10-CM | POA: Diagnosis present

## 2020-05-12 DIAGNOSIS — Z8661 Personal history of infections of the central nervous system: Secondary | ICD-10-CM

## 2020-05-12 DIAGNOSIS — Y92009 Unspecified place in unspecified non-institutional (private) residence as the place of occurrence of the external cause: Secondary | ICD-10-CM

## 2020-05-12 DIAGNOSIS — R471 Dysarthria and anarthria: Secondary | ICD-10-CM | POA: Diagnosis present

## 2020-05-12 LAB — COMPREHENSIVE METABOLIC PANEL
ALT: 32 U/L (ref 0–44)
AST: 51 U/L — ABNORMAL HIGH (ref 15–41)
Albumin: 3.7 g/dL (ref 3.5–5.0)
Alkaline Phosphatase: 51 U/L (ref 38–126)
Anion gap: 9 (ref 5–15)
BUN: 19 mg/dL (ref 8–23)
CO2: 27 mmol/L (ref 22–32)
Calcium: 9.5 mg/dL (ref 8.9–10.3)
Chloride: 102 mmol/L (ref 98–111)
Creatinine, Ser: 0.91 mg/dL (ref 0.61–1.24)
GFR, Estimated: >60 mL/min (ref >60)
Glucose, Bld: 115 mg/dL — ABNORMAL HIGH (ref 70–99)
Potassium: 4.4 mmol/L (ref 3.5–5.1)
Sodium: 138 mmol/L (ref 135–145)
Total Bilirubin: 0.5 mg/dL (ref 0.3–1.2)
Total Protein: 6.5 g/dL (ref 6.5–8.1)

## 2020-05-12 LAB — BRAIN NATRIURETIC PEPTIDE: B Natriuretic Peptide: 83 pg/mL (ref 0.0–100.0)

## 2020-05-12 LAB — I-STAT VENOUS BLOOD GAS, ED
Acid-Base Excess: 4 mmol/L — ABNORMAL HIGH (ref 0.0–2.0)
Bicarbonate: 30.3 mmol/L — ABNORMAL HIGH (ref 20.0–28.0)
Calcium, Ion: 1.24 mmol/L (ref 1.15–1.40)
HCT: 39 % (ref 39.0–52.0)
Hemoglobin: 13.3 g/dL (ref 13.0–17.0)
O2 Saturation: 66 %
Potassium: 4.5 mmol/L (ref 3.5–5.1)
Sodium: 139 mmol/L (ref 135–145)
TCO2: 32 mmol/L (ref 22–32)
pCO2, Ven: 49.9 mmHg (ref 44.0–60.0)
pH, Ven: 7.391 (ref 7.250–7.430)
pO2, Ven: 35 mmHg (ref 32.0–45.0)

## 2020-05-12 LAB — CBC WITH DIFFERENTIAL/PLATELET
Abs Immature Granulocytes: 0.04 10*3/uL (ref 0.00–0.07)
Basophils Absolute: 0 10*3/uL (ref 0.0–0.1)
Basophils Relative: 0 %
Eosinophils Absolute: 0 10*3/uL (ref 0.0–0.5)
Eosinophils Relative: 0 %
HCT: 39.1 % (ref 39.0–52.0)
Hemoglobin: 12.3 g/dL — ABNORMAL LOW (ref 13.0–17.0)
Immature Granulocytes: 0 %
Lymphocytes Relative: 10 %
Lymphs Abs: 0.9 10*3/uL (ref 0.7–4.0)
MCH: 31.5 pg (ref 26.0–34.0)
MCHC: 31.5 g/dL (ref 30.0–36.0)
MCV: 100.3 fL — ABNORMAL HIGH (ref 80.0–100.0)
Monocytes Absolute: 1.4 10*3/uL — ABNORMAL HIGH (ref 0.1–1.0)
Monocytes Relative: 15 %
Neutro Abs: 7.1 10*3/uL (ref 1.7–7.7)
Neutrophils Relative %: 75 %
Platelets: 229 10*3/uL (ref 150–400)
RBC: 3.9 MIL/uL — ABNORMAL LOW (ref 4.22–5.81)
RDW: 12.3 % (ref 11.5–15.5)
WBC: 9.6 10*3/uL (ref 4.0–10.5)
nRBC: 0 % (ref 0.0–0.2)

## 2020-05-12 LAB — LITHIUM LEVEL: Lithium Lvl: 0.54 mmol/L — ABNORMAL LOW (ref 0.60–1.20)

## 2020-05-12 LAB — TSH: TSH: 1.384 u[IU]/mL (ref 0.350–4.500)

## 2020-05-12 LAB — TROPONIN I (HIGH SENSITIVITY)
Troponin I (High Sensitivity): 14 ng/L (ref <18)
Troponin I (High Sensitivity): 20 ng/L — ABNORMAL HIGH (ref <18)

## 2020-05-12 LAB — PROTIME-INR
INR: 1.1 (ref 0.8–1.2)
Prothrombin Time: 13.4 seconds (ref 11.4–15.2)

## 2020-05-12 LAB — RESP PANEL BY RT-PCR (FLU A&B, COVID) ARPGX2
Influenza A by PCR: NEGATIVE
Influenza B by PCR: NEGATIVE
SARS Coronavirus 2 by RT PCR: NEGATIVE

## 2020-05-12 LAB — MAGNESIUM: Magnesium: 2.1 mg/dL (ref 1.7–2.4)

## 2020-05-12 LAB — LACTIC ACID, PLASMA
Lactic Acid, Venous: 1.3 mmol/L (ref 0.5–1.9)
Lactic Acid, Venous: 0.9 mmol/L (ref 0.5–1.9)

## 2020-05-12 LAB — AMMONIA: Ammonia: 23 umol/L (ref 9–35)

## 2020-05-12 LAB — VALPROIC ACID LEVEL: Valproic Acid Lvl: 26 ug/mL — ABNORMAL LOW (ref 50.0–100.0)

## 2020-05-12 LAB — LIPASE, BLOOD: Lipase: 20 U/L (ref 11–51)

## 2020-05-12 LAB — ETHANOL: Alcohol, Ethyl (B): <10 mg/dL (ref <10)

## 2020-05-12 LAB — PHOSPHORUS: Phosphorus: 4 mg/dL (ref 2.5–4.6)

## 2020-05-12 MED ORDER — SODIUM CHLORIDE 0.9 % IV SOLN: Freq: Once | INTRAVENOUS | Status: DC

## 2020-05-12 MED ORDER — THIAMINE HCL 100 MG/ML IJ SOLN
Freq: Once | INTRAVENOUS | Status: AC
  Filled 2020-05-12: qty 1000

## 2020-05-12 NOTE — ED Provider Notes (Addendum)
[facility] EMERGENCY DEPARTMENT Provider Note   CSN: [id number] Arrival date & time: 05/12/20  1558     History No chief complaint on file.   Trevor Cook is a 75 y.o. male.  HPI Patient lives home with his son and his son's wife.  Patient has a history of Warnicke's encephalopathy.  He also has a history of bipolar disorder.  Patient son reports that the patient has not had alcohol since 1998.  He reports at baseline, patient does have some amount of dementia but is up and ambulatory.  He goes out to an out building to do hobbies and projects.  Patient does not drive a car.  Patient's son reports at the end of the summer, the neurologist suspected he had had a stroke due to some increased imbalance and occasional speech changes.  Patient's son and his son's wife report they noted a change over about the past 3 days.  Patient seem like he might be sick.  He seemed to be spending more time in his room and he seemed more unsteady they have not identified any fevers.  Patient has been eating fairly well.  He has not had any vomiting or diarrhea.  He did have a recent medication change.  They are tapering off Depakote and starting Lamictal.  Patient is chronically on lithium.  Change was made by the neurologist for increasing tremor.  Today, the patient's son found the patient face down by his recliner in his room.  He had fallen.  His son did not see him fall.  He tried to help him up but he was extremely weak and unsteady.  This was atypical.  He was sent to the emergency department by EMS for further evaluation.  Patient cannot add anything to the history patient mumbles some unintelligible responses and makes a few intelligible words but no coherent history.    Past Medical History:  Diagnosis Date  . 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HCC)   . Bipolar 1 disorder (HCC)   . Cerebellar disorder   . Epilepsy (HCC)   . Hypothyroid   . Insomnia   . Other postablative  hypothyroidism   . Stutter   . Tremor   . Unspecified condition of brain   . Wernicke's encephalopathy     Patient Active Problem List   Diagnosis Date Noted  . History of alcohol abuse 04/07/2020  . Tremor 09/20/2013  . Unspecified essential hypertension 09/20/2013  . Unspecified hypothyroidism 09/20/2013  . 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 NO PAST SURGERIES         Family History  Problem Relation Age of Onset  . Congestive Heart Failure Mother   . Cancer Father     Social History   Tobacco Use  . Smoking status: Former Games developer  . Smokeless tobacco: Never Used  Substance Use Topics  . Alcohol use: Not Currently    Comment: quit 1999  . Drug use: No      Home Medications Prior to Admission medications   Medication Sig Start Date End Date Taking? Authorizing Provider  ALPRAZolam Prudy Feeler) 1 MG tablet Take 1 mg by mouth at bedtime as needed for anxiety.   Yes [provider]  benztropine (COGENTIN) 1 MG tablet Take 2 mg by mouth daily after supper.   Yes [provider]  divalproex (DEPAKOTE) 500 MG DR tablet Take 500-1,000 mg by mouth See admin instructions. Take 2 tablets (1000  mg) by mouth daily after supper until 05/14/2020, then take 1 tablet (500 mg) daily after supper for 1 week, then stop   Yes [provider]  lamoTRIgine (LAMICTAL) 25 MG tablet 1 tab bid x one week 2 tab bid x 2nd week 3 tab bid x 3rd week Patient taking differently: Take 25-100 mg by mouth See admin instructions. Order date 04/26/2020: take 1 tablet (25 mg) by mouth daily after supper, then take 2 tablets (50 mg) daily after supper until 05/14/2020, then take 3 tablets (75 mg) daily after supper for one week, then take 4 tablets (100 mg) daily after supper. 04/26/20  Yes Marcial Pacas, MD  levothyroxine (SYNTHROID, LEVOTHROID) 50 MCG tablet Take 50 mcg by mouth daily after supper.   Yes [provider]   lithium carbonate 300 MG capsule Take 900 mg by mouth daily after supper.   Yes [provider]  Multiple Vitamin (MULTIVITAMIN WITH MINERALS) TABS tablet Take 1 tablet by mouth daily.   Yes [provider]  risperiDONE (RISPERDAL) 1 MG tablet Take 1 mg by mouth daily after supper.   Yes [provider]  lamoTRIgine (LAMICTAL) 100 MG tablet Take 1 tablet (100 mg total) by mouth 2 (two) times daily. 04/26/20   Marcial Pacas, MD    Allergies    Patient has no known allergies.  Review of Systems   Review of Systems Level 5 caveat cannot obtain review of systems due to patient condition. Physical Exam Updated Vital Signs BP (!) 144/61 (BP Location: Left Arm)   Pulse 72   Temp (!) 100.5 F (38.1 C) (Oral)   Resp (!) 21   SpO2 99%   Physical Exam Constitutional:      Comments: Patient does not exhibit respiratory distress.  He is resting quietly.  HENT:     Head:     Comments: Specific hematomas.  There seem to be a slight swelling around the left eye but no ecchymotic discoloration    Nose: Nose normal.     Mouth/Throat:     Pharynx: Oropharynx is clear.  Eyes:     Comments: Mild to moderate edema of the conjunctivae on the left.  No conjunctival edema on the right pupils are about 3 mm and symmetric  Neck:     Comments: Patient has a towel roll on his neck.  No palpable step-off Cardiovascular:     Rate and Rhythm: Normal rate and regular rhythm.  Pulmonary:     Effort: Pulmonary effort is normal.     Breath sounds: Normal breath sounds.  Abdominal:     General: There is no distension.     Palpations: Abdomen is soft.     Tenderness: There is no abdominal tenderness. There is no guarding.  Musculoskeletal:     Comments: She does not deformity of the extremities  Skin:    General: Skin is warm and dry.  Neurological:     Comments:  he will follow commands to make a weak attempt grip strength bilaterally.  He will also follow commands to  independently elevate each lower extremity off of the bed albeit weakly.  Patient has tremor of the upper extremities speech is predominantly mumbling.  He did however make a coherent sentence     ED Results / Procedures / Treatments   Labs (all labs ordered are listed, but only abnormal results are displayed) Labs Reviewed  COMPREHENSIVE METABOLIC PANEL - Abnormal; Notable for the following components:  Result Value   Glucose, Bld 115 (*)    AST 51 (*)    All other components within normal limits  CBC WITH DIFFERENTIAL/PLATELET - Abnormal; Notable for the following components:   RBC 3.90 (*)    Hemoglobin 12.3 (*)    MCV 100.3 (*)    Monocytes Absolute 1.4 (*)    All other components within normal limits  VALPROIC ACID LEVEL - Abnormal; Notable for the following components:   Valproic Acid Lvl 26 (*)    All other components within normal limits  LITHIUM LEVEL - Abnormal; Notable for the following components:   Lithium Lvl 0.54 (*)    All other components within normal limits  I-STAT VENOUS BLOOD GAS, ED - Abnormal; Notable for the following components:   Bicarbonate 30.3 (*)    Acid-Base Excess 4.0 (*)    All other components within normal limits  TROPONIN I (HIGH SENSITIVITY) - Abnormal; Notable for the following components:   Troponin I (High Sensitivity) 20 (*)    All other components within normal limits  RESP PANEL BY RT-PCR (FLU A&B, COVID) ARPGX2  ETHANOL  LIPASE, BLOOD  LACTIC ACID, PLASMA  LACTIC ACID, PLASMA  BRAIN NATRIURETIC PEPTIDE  PROTIME-INR  AMMONIA  MAGNESIUM  PHOSPHORUS  TSH  URINALYSIS, ROUTINE W REFLEX MICROSCOPIC  RAPID URINE DRUG SCREEN, HOSP PERFORMED  BLOOD GAS, VENOUS  LAMOTRIGINE LEVEL  TROPONIN I (HIGH SENSITIVITY)    EKG EKG Interpretation  Date/Time:  Friday May 12 2020 19:50:19 EST Ventricular Rate:  73 PR Interval:    QRS Duration: 102 QT Interval:  368 QTC Calculation: 406 R Axis:   50 Text Interpretation: Sinus  rhythm Atrial premature complex no ischemic appearance, no sig change from old Confirmed by Arby Barrette [phone]) on 05/12/2020 10:45:47 PM   Radiology CT Head Wo Contrast  Result Date: 05/12/2020 CLINICAL DATA:  Head trauma, minor. Neck trauma. Additional history obtained from electronic MEDICAL RECORD NUMBERUnwitnessed fall today. EXAM: CT HEAD WITHOUT CONTRAST CT CERVICAL SPINE WITHOUT CONTRAST TECHNIQUE: Multidetector CT imaging of the head and cervical spine was performed following the standard protocol without intravenous contrast. Multiplanar CT image reconstructions of the cervical spine were also generated. COMPARISON:  Brain MRI 04/24/2020.  Head CT 04/20/2010. FINDINGS: CT HEAD FINDINGS Brain: Mild cerebral and cerebellar atrophy. Moderate ill-defined hypoattenuation within the cerebral white matter is nonspecific, but compatible with chronic small vessel ischemic disease. 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 There is no acute intracranial hemorrhage. No demarcated cortical infarct. No extra-axial fluid collection. No evidence of intracranial mass. No midline shift. Vascular: No hyperdense vessel.  Atherosclerotic calcifications. Skull: Normal. Negative for fracture or focal lesion. Sinuses/Orbits: Visualized orbits show no acute finding. Mild paranasal sinus mucosal thickening, most notably within the left frontal, bilateral ethmoid and bilateral maxillary sinuses. Small foci of polypoid mucosal thickening versus mucous retention cysts within the bilateral maxillary sinuses. CT CERVICAL SPINE FINDINGS Alignment: Cervical levocurvature, possibly positional. Mild nonspecific reversal of the expected cervical lordosis. 2 mm C2-C3 grade 1 anterolisthesis. Trace C7-T1 grade 1 anterolisthesis. Skull base and vertebrae: The basion-dental and atlanto-dental intervals are maintained.No evidence of acute fracture to the cervical spine. Mild age-indeterminate anterior wedge  compression deformity of the T1 vertebral body (series 6, image 29). Soft tissues and spinal canal: No prevertebral fluid or swelling. No visible canal hematoma. Disc levels: Cervical spondylosis with multilevel disc space narrowing, disc bulges, uncovertebral hypertrophy and facet arthrosis. Disc space narrowing is moderate/advanced at C4-C5, C5-C6 and  C6-C7. facet joint ankylosis on the right and possibly on the left at C2-C3. Upper chest: No consolidation within the imaged lung apices. No visible pneumothorax. IMPRESSION: CT head: 1. No evidence of acute intracranial abnormality. 2. Mild generalized atrophy of the brain with moderate chronic small vessel ischemic disease. 3. 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 4. Paranasal sinus disease as described. CT cervical spine: 1. Mild age-indeterminate anterior wedge compression deformity of the T1 vertebral body. 2. No evidence of acute fracture to the cervical spine. 3. Nonspecific reversal of the expected cervical lordosis. 4. Cervical levocurvature, possibly positional. 5. Mild C2-C3 and C7-T1 grade 1 anterolisthesis. 6. Cervical spondylosis as described. 7. 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 Electronically Signed   By: Jackey Loge DO   On: 05/12/2020 18:05   CT Cervical Spine Wo Contrast  Result Date: 05/12/2020 CLINICAL DATA:  Head trauma, minor. Neck trauma. Additional history obtained from electronic MEDICAL RECORD NUMBERUnwitnessed fall today. EXAM: CT HEAD WITHOUT CONTRAST CT CERVICAL SPINE WITHOUT CONTRAST TECHNIQUE: Multidetector CT imaging of the head and cervical spine was performed following the standard protocol without intravenous contrast. Multiplanar CT image reconstructions of the cervical spine were also generated. COMPARISON:  Brain MRI 04/24/2020.  Head CT 04/20/2010. FINDINGS: CT HEAD FINDINGS Brain: Mild cerebral and cerebellar atrophy. Moderate ill-defined hypoattenuation within the cerebral white matter is  nonspecific, but compatible with chronic small vessel ischemic disease. 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 There is no acute intracranial hemorrhage. No demarcated cortical infarct. No extra-axial fluid collection. No evidence of intracranial mass. No midline shift. Vascular: No hyperdense vessel.  Atherosclerotic calcifications. Skull: Normal. Negative for fracture or focal lesion. Sinuses/Orbits: Visualized orbits show no acute finding. Mild paranasal sinus mucosal thickening, most notably within the left frontal, bilateral ethmoid and bilateral maxillary sinuses. Small foci of polypoid mucosal thickening versus mucous retention cysts within the bilateral maxillary sinuses. CT CERVICAL SPINE FINDINGS Alignment: Cervical levocurvature, possibly positional. Mild nonspecific reversal of the expected cervical lordosis. 2 mm C2-C3 grade 1 anterolisthesis. Trace C7-T1 grade 1 anterolisthesis. Skull base and vertebrae: The basion-dental and atlanto-dental intervals are maintained.No evidence of acute fracture to the cervical spine. Mild age-indeterminate anterior wedge compression deformity of the T1 vertebral body (series 6, image 29). Soft tissues and spinal canal: No prevertebral fluid or swelling. No visible canal hematoma. Disc levels: Cervical spondylosis with multilevel disc space narrowing, disc bulges, uncovertebral hypertrophy and facet arthrosis. Disc space narrowing is moderate/advanced at C4-C5, C5-C6 and C6-C7. facet joint ankylosis on the right and possibly on the left at C2-C3. Upper chest: No consolidation within the imaged lung apices. No visible pneumothorax. IMPRESSION: CT head: 1. No evidence of acute intracranial abnormality. 2. Mild generalized atrophy of the brain with moderate chronic small vessel ischemic disease. 3. 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 4. Paranasal sinus disease as described. CT cervical spine: 1.  Mild age-indeterminate anterior wedge compression deformity of the T1 vertebral body. 2. No evidence of acute fracture to the cervical spine. 3. Nonspecific reversal of the expected cervical lordosis. 4. Cervical levocurvature, possibly positional. 5. Mild C2-C3 and C7-T1 grade 1 anterolisthesis. 6. Cervical spondylosis as described. 7. 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 Electronically Signed   By: Jackey Loge DO   On: 05/12/2020 18:05   DG Chest Port 1 View  Result Date: 05/12/2020 CLINICAL DATA:  Dementia worsening over the last 4-6 months. EXAM: PORTABLE CHEST 1 VIEW COMPARISON:  04/20/2010 FINDINGS: Cardiac silhouette  is normal in size. No mediastinal or hilar masses. Clear lungs.  No pleural effusion or pneumothorax. Skeletal structures are grossly intact. IMPRESSION: No acute cardiopulmonary disease. Electronically Signed   By: Amie Portland M.D.   On: 05/12/2020 17:18    Procedures Procedures (including critical care time)  Medications Ordered in ED Medications  sodium chloride 0.9 % 1,000 mL with thiamine 100 mg, folic acid 1 mg, multivitamins adult 10 mL infusion ( Intravenous New Bag/Given 05/12/20 1733)    ED Course  I have reviewed the triage vital signs and the nursing notes.  Pertinent labs & imaging results that were available during my care of the patient were reviewed by me and considered in my medical decision making (see chart for details).    MDM Rules/Calculators/A&P                           Consult: [facility] hospitalist Dr. Debby Bud for admission  Patient has history of Warnicke's encephalopathy.  He however has not been an alcohol user since 1998 per his son.  He lives home with his son and his son's wife baseline, son describes the patient as being independent.  He ambulates through the house into an out building where he does small projects.  Patient has become significantly confused and had change in activity level over the past 3 days, culminating in a fall.  They have not  identified any fever or other specific symptoms.  Patient is an exceptionally poor historian and cannot provide any useful information.  COVID testing is negative and chest x-ray does not show infiltrates labs are grossly unremarkable.  Urinalysis is still pending.  Patient is not hypotensive.  He is developing a low-grade fever.  He has undergone recent medication changes, tapering off of Depakote and starting Lamictal.  Possible consideration of serotonin syndrome, other infectious etiology although no evidence of sepsis at this time.  No empiric antibiotics initiated.  Patient's son denies possibility of alcohol consumption thus withdrawal seems unlikely.  Plan will be for admission for acute on chronic encephalopathy unclear etiology. Final Clinical Impression(s) / ED Diagnoses Final diagnoses:  Encephalopathy  Fall, initial encounter  Severe comorbid illness    Rx / DC Orders ED Discharge Orders    None       Arby Barrette, MD 05/12/20 2251    Arby Barrette, MD 05/12/20 2325

## 2020-05-12 NOTE — ED Triage Notes (Signed)
Pt from home via ems; unwitnessed fall today; family also endorses alcohol induced dementia, worsening over past 4-6 months; no obvious injuries noted by ems; pt not on thinners; pt "babbles", which per family is baseline; incontinent of urine; would not tolerate c-collar with ems

## 2020-05-12 NOTE — ED Notes (Signed)
Condom cath placed on pt to obtain urine

## 2020-05-13 ENCOUNTER — Observation Stay (HOSPITAL_COMMUNITY): Payer: Medicare Other

## 2020-05-13 DIAGNOSIS — G9341 Metabolic encephalopathy: Secondary | ICD-10-CM

## 2020-05-13 DIAGNOSIS — Y92009 Unspecified place in unspecified non-institutional (private) residence as the place of occurrence of the external cause: Secondary | ICD-10-CM | POA: Diagnosis not present

## 2020-05-13 DIAGNOSIS — F04 Amnestic disorder due to known physiological condition: Secondary | ICD-10-CM

## 2020-05-13 DIAGNOSIS — Z8661 Personal history of infections of the central nervous system: Secondary | ICD-10-CM | POA: Diagnosis not present

## 2020-05-13 DIAGNOSIS — Z66 Do not resuscitate: Secondary | ICD-10-CM | POA: Diagnosis present

## 2020-05-13 DIAGNOSIS — G0490 Encephalitis and encephalomyelitis, unspecified: Secondary | ICD-10-CM | POA: Diagnosis present

## 2020-05-13 DIAGNOSIS — E512 Wernicke's encephalopathy: Secondary | ICD-10-CM | POA: Diagnosis present

## 2020-05-13 DIAGNOSIS — F1096 Alcohol use, unspecified with alcohol-induced persisting amnestic disorder: Secondary | ICD-10-CM | POA: Diagnosis not present

## 2020-05-13 DIAGNOSIS — G928 Other toxic encephalopathy: Secondary | ICD-10-CM | POA: Diagnosis present

## 2020-05-13 DIAGNOSIS — G40909 Epilepsy, unspecified, not intractable, without status epilepticus: Secondary | ICD-10-CM | POA: Diagnosis present

## 2020-05-13 DIAGNOSIS — Z79899 Other long term (current) drug therapy: Secondary | ICD-10-CM | POA: Diagnosis not present

## 2020-05-13 DIAGNOSIS — G2119 Other drug induced secondary parkinsonism: Secondary | ICD-10-CM

## 2020-05-13 DIAGNOSIS — G9340 Encephalopathy, unspecified: Secondary | ICD-10-CM | POA: Diagnosis present

## 2020-05-13 DIAGNOSIS — Z809 Family history of malignant neoplasm, unspecified: Secondary | ICD-10-CM | POA: Diagnosis not present

## 2020-05-13 DIAGNOSIS — R251 Tremor, unspecified: Secondary | ICD-10-CM

## 2020-05-13 DIAGNOSIS — R471 Dysarthria and anarthria: Secondary | ICD-10-CM | POA: Diagnosis present

## 2020-05-13 DIAGNOSIS — Z8249 Family history of ischemic heart disease and other diseases of the circulatory system: Secondary | ICD-10-CM | POA: Diagnosis not present

## 2020-05-13 DIAGNOSIS — Z79890 Hormone replacement therapy (postmenopausal): Secondary | ICD-10-CM | POA: Diagnosis not present

## 2020-05-13 DIAGNOSIS — I10 Essential (primary) hypertension: Secondary | ICD-10-CM | POA: Diagnosis present

## 2020-05-13 DIAGNOSIS — E039 Hypothyroidism, unspecified: Secondary | ICD-10-CM | POA: Diagnosis not present

## 2020-05-13 DIAGNOSIS — F319 Bipolar disorder, unspecified: Secondary | ICD-10-CM | POA: Diagnosis present

## 2020-05-13 DIAGNOSIS — Z20822 Contact with and (suspected) exposure to covid-19: Secondary | ICD-10-CM | POA: Diagnosis present

## 2020-05-13 DIAGNOSIS — W19XXXA Unspecified fall, initial encounter: Secondary | ICD-10-CM

## 2020-05-13 DIAGNOSIS — G4700 Insomnia, unspecified: Secondary | ICD-10-CM | POA: Diagnosis present

## 2020-05-13 DIAGNOSIS — F0390 Unspecified dementia without behavioral disturbance: Secondary | ICD-10-CM | POA: Diagnosis present

## 2020-05-13 DIAGNOSIS — F1097 Alcohol use, unspecified with alcohol-induced persisting dementia: Secondary | ICD-10-CM | POA: Diagnosis present

## 2020-05-13 DIAGNOSIS — E871 Hypo-osmolality and hyponatremia: Secondary | ICD-10-CM | POA: Diagnosis not present

## 2020-05-13 DIAGNOSIS — Z8673 Personal history of transient ischemic attack (TIA), and cerebral infarction without residual deficits: Secondary | ICD-10-CM | POA: Diagnosis not present

## 2020-05-13 DIAGNOSIS — Z87891 Personal history of nicotine dependence: Secondary | ICD-10-CM | POA: Diagnosis not present

## 2020-05-13 DIAGNOSIS — E890 Postprocedural hypothyroidism: Secondary | ICD-10-CM | POA: Diagnosis present

## 2020-05-13 LAB — URINALYSIS, ROUTINE W REFLEX MICROSCOPIC
Bacteria, UA: NONE SEEN
Bilirubin Urine: NEGATIVE
Glucose, UA: NEGATIVE mg/dL
Hgb urine dipstick: NEGATIVE
Ketones, ur: 20 mg/dL — AB
Leukocytes,Ua: NEGATIVE
Nitrite: NEGATIVE
Protein, ur: 30 mg/dL — AB
Specific Gravity, Urine: 1.024 (ref 1.005–1.030)
pH: 6 (ref 5.0–8.0)

## 2020-05-13 LAB — RAPID URINE DRUG SCREEN, HOSP PERFORMED
Amphetamines: NOT DETECTED
Barbiturates: NOT DETECTED
Benzodiazepines: POSITIVE — AB
Cocaine: NOT DETECTED
Opiates: NOT DETECTED
Tetrahydrocannabinol: NOT DETECTED

## 2020-05-13 MED ORDER — LEVOTHYROXINE SODIUM 50 MCG PO TABS
50.0000 ug | ORAL_TABLET | Freq: Every day | ORAL | Status: DC
  Administered 2020-05-13 – 2020-05-17 (×5): 50 ug via ORAL
  Filled 2020-05-13 (×5): qty 1

## 2020-05-13 MED ORDER — ONDANSETRON HCL 4 MG/2ML IJ SOLN: 4.0000 mg | Freq: Four times a day (QID) | INTRAMUSCULAR | Status: DC | PRN

## 2020-05-13 MED ORDER — SODIUM CHLORIDE 0.45 % IV SOLN: INTRAVENOUS | Status: DC

## 2020-05-13 MED ORDER — RISPERIDONE 1 MG PO TABS: 1.0000 mg | ORAL_TABLET | Freq: Every day | ORAL | Status: DC

## 2020-05-13 MED ORDER — ALPRAZOLAM 0.5 MG PO TABS: 1.0000 mg | ORAL_TABLET | Freq: Every evening | ORAL | Status: DC | PRN

## 2020-05-13 MED ORDER — LAMOTRIGINE 25 MG PO TABS: 50.0000 mg | ORAL_TABLET | Freq: Every day | ORAL | Status: DC

## 2020-05-13 MED ORDER — LAMOTRIGINE 25 MG PO TABS: 25.0000 mg | ORAL_TABLET | ORAL | Status: DC

## 2020-05-13 MED ORDER — THIAMINE HCL 100 MG/ML IJ SOLN
250.0000 mg | Freq: Every day | INTRAVENOUS | Status: DC
  Administered 2020-05-15 – 2020-05-17 (×3): 250 mg via INTRAVENOUS
  Filled 2020-05-13 (×4): qty 2.5

## 2020-05-13 MED ORDER — THIAMINE HCL 100 MG/ML IJ SOLN
500.0000 mg | Freq: Three times a day (TID) | INTRAVENOUS | Status: AC
  Administered 2020-05-13 – 2020-05-14 (×5): 500 mg via INTRAVENOUS
  Filled 2020-05-13 (×6): qty 5

## 2020-05-13 MED ORDER — LITHIUM CARBONATE 300 MG PO CAPS
900.0000 mg | ORAL_CAPSULE | Freq: Every day | ORAL | Status: DC
  Administered 2020-05-13 – 2020-05-17 (×5): 900 mg via ORAL
  Filled 2020-05-13 (×5): qty 3

## 2020-05-13 MED ORDER — ACETAMINOPHEN 650 MG RE SUPP: 650.0000 mg | Freq: Four times a day (QID) | RECTAL | Status: DC | PRN

## 2020-05-13 MED ORDER — LAMOTRIGINE 25 MG PO TABS: 75.0000 mg | ORAL_TABLET | Freq: Every day | ORAL | Status: DC

## 2020-05-13 MED ORDER — LAMOTRIGINE 100 MG PO TABS: 100.0000 mg | ORAL_TABLET | Freq: Every day | ORAL | Status: DC

## 2020-05-13 MED ORDER — THIAMINE HCL 100 MG/ML IJ SOLN: 100.0000 mg | Freq: Every day | INTRAMUSCULAR | Status: DC

## 2020-05-13 MED ORDER — BENZTROPINE MESYLATE 1 MG PO TABS: 2.0000 mg | ORAL_TABLET | Freq: Every day | ORAL | Status: DC

## 2020-05-13 MED ORDER — ENOXAPARIN SODIUM 40 MG/0.4ML ~~LOC~~ SOLN
40.0000 mg | SUBCUTANEOUS | Status: DC
  Administered 2020-05-13 – 2020-05-17 (×5): 40 mg via SUBCUTANEOUS
  Filled 2020-05-13 (×6): qty 0.4

## 2020-05-13 MED ORDER — ADULT MULTIVITAMIN W/MINERALS CH
1.0000 | ORAL_TABLET | Freq: Every day | ORAL | Status: DC
  Administered 2020-05-15 – 2020-05-17 (×3): 1 via ORAL
  Filled 2020-05-13 (×4): qty 1

## 2020-05-13 MED ORDER — HALOPERIDOL LACTATE 5 MG/ML IJ SOLN
2.0000 mg | Freq: Once | INTRAMUSCULAR | Status: AC
  Administered 2020-05-13: 2 mg via INTRAVENOUS
  Filled 2020-05-13: qty 1

## 2020-05-13 MED ORDER — ACETAMINOPHEN 325 MG PO TABS
650.0000 mg | ORAL_TABLET | Freq: Four times a day (QID) | ORAL | Status: DC | PRN
  Administered 2020-05-13: 650 mg via ORAL
  Filled 2020-05-13: qty 2

## 2020-05-13 NOTE — ED Notes (Signed)
Pt lying with his clothes off confused

## 2020-05-13 NOTE — ED Notes (Signed)
sleeping

## 2020-05-13 NOTE — ED Notes (Signed)
Pt returned to ED from MRI. Transport order placed for transport to [facility].

## 2020-05-13 NOTE — H&P (Signed)
History and Physical    Trevor Cook ASN:[id number] DOB: [birth year] DOA: 05/12/2020  PCP: Trevor Puffer, MD (Confirm with patient/family/NH records and if not entered, this has to be entered at [facility] point of entry) Patient coming from: home  I have personally briefly reviewed patient's old medical records in [facility]  Chief Complaint: found down at home  HPI: Trevor Cook is a 75 y.o. male with medical history significant of EtOH related chronic encephalitis, HTN, hypothyroidism, bipolar disease.  Patient at lives home with his son and his son's wife.  Patient has a history of Warnicke's encephalopathy.  He also has a history of bipolar disorder.  Patient son reports that the patient has not had alcohol since 1998.  He reports at baseline, patient does have some amount of dementia but is up and ambulatory.  He goes out to an out building to do hobbies and projects.  Patient does not drive a car.  Patient's son reports at the end of the summer, the neurologist suspected he had had a stroke due to some increased imbalance and occasional speech changes.  Patient's son and his son's wife report they noted a change over about the past 3 days.  Patient seem like he might be sick.  He seemed to be spending more time in his room and he seemed more unsteady they have not identified any fevers.  Patient has been eating fairly well.  He has not had any vomiting or diarrhea.  He did have a recent medication change.  They are tapering off Depakote and starting Lamictal.  Patient is chronically on lithium.  Change was made by the neurologist for increasing tremor.  Today, the patient's son found the patient face down by his recliner in his room.  He had fallen.  His son did not see him fall.  He tried to help him up but he was extremely weak and unsteady.  This was atypical.  He was sent to the emergency department by EMS for further evaluation.  Patient cannot add anything to the history patient mumbles some  unintelligible responses and makes a few intelligible words but no coherent history. .  ED Course: T 100.5  140/57  HR 69  RR 14. EDP exam notable for some swelling in the left periorbital aarea and conjunctiva, tremor. Lab - Covid NEGATIVE, Troponin #1 20, BNP 83, Li 0.54, TSH 1.284, WBC 9.6, Hgb 12.3, U/A negatvie. CT head no acute injury. CT C-spine no acute fx. CXR NAD. TRH called to admit for encephalopathy.  Review of Systems: As per HPI otherwise 10 point review of systems negative. Caveat - poor historian   Past Medical History:  Diagnosis Date  . 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HCC)   . Bipolar 1 disorder (HCC)   . Cerebellar disorder   . Epilepsy (HCC)   . Hypothyroid   . Insomnia   . Other postablative hypothyroidism   . Stutter   . Tremor   . Unspecified condition of brain   . Wernicke's encephalopathy     Past Surgical History:  Procedure Laterality Date  . NO PAST SURGERIES       reports that he has quit smoking. He has never used smokeless tobacco. He reports previous alcohol use. He reports that he does not use drugs.  No Known Allergies  Family History  Problem Relation Age of Onset  . Congestive Heart Failure Mother   . Cancer Father      Prior to Admission medications   Medication  Sig Start Date End Date Taking? Authorizing Provider  ALPRAZolam Prudy Feeler(XANAX) 1 MG tablet Take 1 mg by mouth at bedtime as needed for anxiety.   Yes [provider]  benztropine (COGENTIN) 1 MG tablet Take 2 mg by mouth daily after supper.   Yes [provider]  divalproex (DEPAKOTE) 500 MG DR tablet Take 500-1,000 mg by mouth See admin instructions. Take 2 tablets (1000 mg) by mouth daily after supper until 05/14/2020, then take 1 tablet (500 mg) daily after supper for 1 week, then stop   Yes [provider]  lamoTRIgine (LAMICTAL) 25 MG tablet 1 tab bid x one week 2 tab bid x 2nd week 3 tab bid x 3rd week Patient taking differently: Take 25-100 mg  by mouth See admin instructions. Order date 04/26/2020: take 1 tablet (25 mg) by mouth daily after supper, then take 2 tablets (50 mg) daily after supper until 05/14/2020, then take 3 tablets (75 mg) daily after supper for one week, then take 4 tablets (100 mg) daily after supper. 04/26/20  Yes Levert FeinsteinYan, Yijun, MD  levothyroxine (SYNTHROID, LEVOTHROID) 50 MCG tablet Take 50 mcg by mouth daily after supper.   Yes [provider]  lithium carbonate 300 MG capsule Take 900 mg by mouth daily after supper.   Yes [provider]  Multiple Vitamin (MULTIVITAMIN WITH MINERALS) TABS tablet Take 1 tablet by mouth daily.   Yes [provider]  risperiDONE (RISPERDAL) 1 MG tablet Take 1 mg by mouth daily after supper.   Yes [provider]  lamoTRIgine (LAMICTAL) 100 MG tablet Take 1 tablet (100 mg total) by mouth 2 (two) times daily. 04/26/20   Levert FeinsteinYan, Yijun, MD    Physical Exam: Vitals:   05/12/20 2300 05/12/20 2315 05/12/20 2345 05/13/20 0000  BP: (!) 152/67 122/82 (!) 145/67 (!) 140/57  Pulse: 71 (!) 101 74 69  Resp: (!) 22 (!) 25 (!) 23 14  Temp:      TempSrc:      SpO2: 98% 99% 97% 98%     Vitals:   05/12/20 2300 05/12/20 2315 05/12/20 2345 05/13/20 0000  BP: (!) 152/67 122/82 (!) 145/67 (!) 140/57  Pulse: 71 (!) 101 74 69  Resp: (!) 22 (!) 25 (!) 23 14  Temp:      TempSrc:      SpO2: 98% 99% 97% 98%   Gerneral:  Mildly overweight man who is non-communicative but mumbles and groans. Does not appear to be in distress. Eyes: PERRL, lids and conjunctivae normal. Left eye not open with a small amount of swelling w/o bruising.  ENMT: Mucous membranes are moist.  Neck:  no masses, no thyromegaly Respiratory: clear to auscultation bilaterally, no wheezing, no crackles. Normal respiratory effort. No accessory muscle use.  Cardiovascular: Regular rate and rhythm, no murmurs / rubs / gallops. No extremity edema. 2+ pedal pulses. No carotid bruits.  Abdomen: no tenderness,  no masses palpated. No hepatosplenomegaly. Bowel sounds positive.  Musculoskeletal: no clubbing / cyanosis. No joint deformity upper and lower extremities. Good ROM, no contractures. Normal muscle tone.  Skin: no rashes, lesions, ulcers. No induration Neurologic: CN 2-12 grossly intact with normal facial symmetry. Exam hindered by inability to follow commands. .  Psychiatric: Awake. Vocalizes but is not speaking/forming words. Does not appear restless or upset.     Labs on Admission: I have personally reviewed following labs and imaging studies  CBC: Recent Labs  Lab 05/12/20 1632 05/12/20 1711  WBC 9.6  --  NEUTROABS 7.1  --   HGB 12.3* 13.3  HCT 39.1 39.0  MCV 100.3*  --   PLT 229  --    Basic Metabolic Panel: Recent Labs  Lab 05/12/20 1632 05/12/20 1711  NA 138 139  K 4.4 4.5  CL 102  --   CO2 27  --   GLUCOSE 115*  --   BUN 19  --   CREATININE 0.91  --   CALCIUM 9.5  --   MG 2.1  --   PHOS 4.0  --    GFR: CrCl cannot be calculated (Unknown ideal weight.). Liver Function Tests: Recent Labs  Lab 05/12/20 1632  AST 51*  ALT 32  ALKPHOS 51  BILITOT 0.5  PROT 6.5  ALBUMIN 3.7   Recent Labs  Lab 05/12/20 1632  LIPASE 20   Recent Labs  Lab 05/12/20 1633  AMMONIA 23   Coagulation Profile: Recent Labs  Lab 05/12/20 1632  INR 1.1   Cardiac Enzymes: No results for input(s): CKTOTAL, CKMB, CKMBINDEX, TROPONINI in the last 168 hours. BNP (last 3 results) No results for input(s): PROBNP in the last 8760 hours. HbA1C: No results for input(s): HGBA1C in the last 72 hours. CBG: No results for input(s): GLUCAP in the last 168 hours. Lipid Profile: No results for input(s): CHOL, HDL, LDLCALC, TRIG, CHOLHDL, LDLDIRECT in the last 72 hours. Thyroid Function Tests: Recent Labs    05/12/20 1703  TSH 1.384   Anemia Panel: No results for input(s): VITAMINB12, FOLATE, FERRITIN, TIBC, IRON, RETICCTPCT in the last 72 hours. Urine analysis:    Component  Value Date/Time   COLORURINE YELLOW 04/20/2010 1643   APPEARANCEUR CLEAR 04/20/2010 1643   LABSPEC 1.004 (L) 04/20/2010 1643   PHURINE 8.5 (H) 04/20/2010 1643   GLUCOSEU NEGATIVE 04/20/2010 1643   HGBUR NEGATIVE 04/20/2010 1643   BILIRUBINUR NEGATIVE 04/20/2010 1643   KETONESUR NEGATIVE 04/20/2010 1643   PROTEINUR NEGATIVE 04/20/2010 1643   UROBILINOGEN 0.2 04/20/2010 1643   NITRITE NEGATIVE 04/20/2010 1643   LEUKOCYTESUR  04/20/2010 1643    NEGATIVE MICROSCOPIC NOT DONE ON URINES WITH NEGATIVE PROTEIN, BLOOD, LEUKOCYTES, NITRITE, OR GLUCOSE <1000 mg/dL.    Radiological Exams on Admission: CT Head Wo Contrast  Result Date: 05/12/2020 CLINICAL DATA:  Head trauma, minor. Neck trauma. Additional history obtained from electronic MEDICAL RECORD NUMBERUnwitnessed fall today. EXAM: CT HEAD WITHOUT CONTRAST CT CERVICAL SPINE WITHOUT CONTRAST TECHNIQUE: Multidetector CT imaging of the head and cervical spine was performed following the standard protocol without intravenous contrast. Multiplanar CT image reconstructions of the cervical spine were also generated. COMPARISON:  Brain MRI 04/24/2020.  Head CT 04/20/2010. FINDINGS: CT HEAD FINDINGS Brain: Mild cerebral and cerebellar atrophy. Moderate ill-defined hypoattenuation within the cerebral white matter is nonspecific, but compatible with chronic small vessel ischemic disease. 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 There is no acute intracranial hemorrhage. No demarcated cortical infarct. No extra-axial fluid collection. No evidence of intracranial mass. No midline shift. Vascular: No hyperdense vessel.  Atherosclerotic calcifications. Skull: Normal. Negative for fracture or focal lesion. Sinuses/Orbits: Visualized orbits show no acute finding. Mild paranasal sinus mucosal thickening, most notably within the left frontal, bilateral ethmoid and bilateral maxillary sinuses. Small foci of polypoid mucosal thickening versus  mucous retention cysts within the bilateral maxillary sinuses. CT CERVICAL SPINE FINDINGS Alignment: Cervical levocurvature, possibly positional. Mild nonspecific reversal of the expected cervical lordosis. 2 mm C2-C3 grade 1 anterolisthesis. Trace C7-T1 grade 1 anterolisthesis. Skull base and vertebrae: The basion-dental  and atlanto-dental intervals are maintained.No evidence of acute fracture to the cervical spine. Mild age-indeterminate anterior wedge compression deformity of the T1 vertebral body (series 6, image 29). Soft tissues and spinal canal: No prevertebral fluid or swelling. No visible canal hematoma. Disc levels: Cervical spondylosis with multilevel disc space narrowing, disc bulges, uncovertebral hypertrophy and facet arthrosis. Disc space narrowing is moderate/advanced at C4-C5, C5-C6 and C6-C7. facet joint ankylosis on the right and possibly on the left at C2-C3. Upper chest: No consolidation within the imaged lung apices. No visible pneumothorax. IMPRESSION: CT head: 1. No evidence of acute intracranial abnormality. 2. Mild generalized atrophy of the brain with moderate chronic small vessel ischemic disease. 3. 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 4. Paranasal sinus disease as described. CT cervical spine: 1. Mild age-indeterminate anterior wedge compression deformity of the T1 vertebral body. 2. No evidence of acute fracture to the cervical spine. 3. Nonspecific reversal of the expected cervical lordosis. 4. Cervical levocurvature, possibly positional. 5. Mild C2-C3 and C7-T1 grade 1 anterolisthesis. 6. Cervical spondylosis as described. 7. 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 Electronically Signed   By: Jackey Loge DO   On: 05/12/2020 18:05   CT Cervical Spine Wo Contrast  Result Date: 05/12/2020 CLINICAL DATA:  Head trauma, minor. Neck trauma. Additional history obtained from electronic MEDICAL RECORD NUMBERUnwitnessed fall today. EXAM: CT HEAD WITHOUT CONTRAST CT  CERVICAL SPINE WITHOUT CONTRAST TECHNIQUE: Multidetector CT imaging of the head and cervical spine was performed following the standard protocol without intravenous contrast. Multiplanar CT image reconstructions of the cervical spine were also generated. COMPARISON:  Brain MRI 04/24/2020.  Head CT 04/20/2010. FINDINGS: CT HEAD FINDINGS Brain: Mild cerebral and cerebellar atrophy. Moderate ill-defined hypoattenuation within the cerebral white matter is nonspecific, but compatible with chronic small vessel ischemic disease. 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 There is no acute intracranial hemorrhage. No demarcated cortical infarct. No extra-axial fluid collection. No evidence of intracranial mass. No midline shift. Vascular: No hyperdense vessel.  Atherosclerotic calcifications. Skull: Normal. Negative for fracture or focal lesion. Sinuses/Orbits: Visualized orbits show no acute finding. Mild paranasal sinus mucosal thickening, most notably within the left frontal, bilateral ethmoid and bilateral maxillary sinuses. Small foci of polypoid mucosal thickening versus mucous retention cysts within the bilateral maxillary sinuses. CT CERVICAL SPINE FINDINGS Alignment: Cervical levocurvature, possibly positional. Mild nonspecific reversal of the expected cervical lordosis. 2 mm C2-C3 grade 1 anterolisthesis. Trace C7-T1 grade 1 anterolisthesis. Skull base and vertebrae: The basion-dental and atlanto-dental intervals are maintained.No evidence of acute fracture to the cervical spine. Mild age-indeterminate anterior wedge compression deformity of the T1 vertebral body (series 6, image 29). Soft tissues and spinal canal: No prevertebral fluid or swelling. No visible canal hematoma. Disc levels: Cervical spondylosis with multilevel disc space narrowing, disc bulges, uncovertebral hypertrophy and facet arthrosis. Disc space narrowing is moderate/advanced at C4-C5, C5-C6 and C6-C7. facet  joint ankylosis on the right and possibly on the left at C2-C3. Upper chest: No consolidation within the imaged lung apices. No visible pneumothorax. IMPRESSION: CT head: 1. No evidence of acute intracranial abnormality. 2. Mild generalized atrophy of the brain with moderate chronic small vessel ischemic disease. 3. 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 4. Paranasal sinus disease as described. CT cervical spine: 1. Mild age-indeterminate anterior wedge compression deformity of the T1 vertebral body. 2. No evidence of acute fracture to the cervical spine. 3. Nonspecific reversal of the expected cervical lordosis. 4. Cervical levocurvature,  possibly positional. 5. Mild C2-C3 and C7-T1 grade 1 anterolisthesis. 6. Cervical spondylosis as described. 7. 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 Electronically Signed   By: Jackey LogeKyle  Golden DO   On: 05/12/2020 18:05   DG Chest Port 1 View  Result Date: 05/12/2020 CLINICAL DATA:  Dementia worsening over the last 4-6 months. EXAM: PORTABLE CHEST 1 VIEW COMPARISON:  04/20/2010 FINDINGS: Cardiac silhouette is normal in size. No mediastinal or hilar masses. Clear lungs.  No pleural effusion or pneumothorax. Skeletal structures are grossly intact. IMPRESSION: No acute cardiopulmonary disease. Electronically Signed   By: Amie Portlandavid  Ormond M.D.   On: 05/12/2020 17:18    EKG: Independently reviewed. Sinus Rhythm, no acute changes  Assessment/Plan Active Problems:   Acute metabolic encephalopathy   Tremor   Essential hypertension   Hypothyroidism   Korsakoff syndrome (HCC)     1. Acute metabolic encephalopathy - he does have EtOH related chronic encephalitis peer Dr. Terrace ArabiaYan (neurology) office note and tremor. He is different from his baseline. Labs seem unremarkable but he does have a fever with no obvious source. Is transitioning to lamictal. MR! 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 Son reports that there has been a significant change  in gait and balance and short term memory. Also more mania and paranoia over past weeks. Plan Admit for observation  CBC, Bmet in AM  Hold lamictal as possible causative factor  For persistent encephalopathy may need neuro consult  PT/OT eval  2. HTN- continue home meds  3. Hypothyroidism - TSH in normal range. Will continue present medication  4. Code status - discussed with son - he says his dad would not want to be diminished. DNR/DNI  DVT prophylaxis: lovenox  Code Status: DNR/DNI  Family Communication: Spoke with son - see code status.  Disposition Plan: TBD - TOC   Consults called: none (with names) Admission status: observation    Illene RegulusMichael Mileena Rothenberger MD [facility] Pager [phone]- [phone]  If 7PM-7AM, please contact night-coverage [email or web address] Password [email or web address]  05/13/2020, 1:23 AM

## 2020-05-13 NOTE — ED Notes (Signed)
He follows some commands

## 2020-05-13 NOTE — TOC Initial Note (Signed)
Transition of Care [facility]) - Initial/Assessment Note    Patient Details  Name: Trevor Cook MRN: [id number] Date of Birth: May 27, 1945  [facility] CM/SW Contact:    Lockie Pares, RN Phone Number: 05/13/2020, 8:56 AM  Clinical Narrative:                 Patient admitted for encephalopathy, unknown etiology , has chronic alcoholic dementia and bipolar disorder, lives with son and his sons wife. Is normally independent ambulating, cohenrent. CT shows no new stroke, old stroke cerebellar. Will likely obtain another CT for follow up. Has recently changed medications from depakote to lamictal. valproic Acid level wnl. Lithium level 0.5 Depending on resolvation of findings, patient may need skilled care placement, however with past history will be difficult to place. Consider palliative care consult to identify GOC . CM will follow for needs  Expected Discharge Plan: Skilled Nursing Facility Barriers to Discharge: Continued Medical Work up   Patient Goals and CMS Choice        Expected Discharge Plan and Services Expected Discharge Plan: Skilled Nursing Facility   Discharge Planning Services: CM Consult   Living arrangements for the past 2 months: Single Family Home                                      Prior Living Arrangements/Services Living arrangements for the past 2 months: Single Family Home Lives with:: Adult Children Patient language and need for interpreter reviewed:: Yes        Need for Family Participation in Patient Care: Yes (Comment) Care giver support system in place?: Yes (comment)   Criminal Activity/Legal Involvement Pertinent to Current Situation/Hospitalization: No - Comment as needed  Activities of Daily Living      Permission Sought/Granted                  Emotional Assessment       Orientation: : Fluctuating Orientation (Suspected and/or reported Sundowners) Alcohol / Substance Use: Not Applicable (past etoh abuse  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 Acute metabolic encephalopathy 05/13/2020  . Encephalopathy 05/13/2020  . History of alcohol abuse 04/07/2020  . Tremor 09/20/2013  . Essential hypertension 09/20/2013  . Hypothyroidism 09/20/2013  . 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/20/2013   PCP:  Aida Puffer, MD Pharmacy:   [facility] [id number] - [place], [place] - [address] AT [facility] OF [address] [place] [address] Phone: [phone] Fax: [phone]     Social Determinants of Health (SDOH) Interventions    Readmission Risk Interventions No flowsheet data found.

## 2020-05-13 NOTE — Consult Note (Signed)
NEUROLOGY CONSULTATION NOTE   Date of service: May 13, 2020 Patient Name: Trevor Cook MRN:  [id number] DOB:  03/17/1946 Reason for consult: Altered Mental Status _ _ _   _ __   _ __ _ _  __ __   _ __   __ _  History of Present Illness  Trevor Cook is a 75 y.o. male with PMH significant for  has a past medical history of 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HCC), Bipolar 1 disorder (HCC), Cerebellar disorder, Epilepsy (HCC), Hypothyroid, Insomnia, Other postablative hypothyroidism, Stutter, Tremor, Unspecified condition of brain, and Wernicke's encephalopathy. who presents with Altered Mental Status. As per ED provider notes Pt's son noted worsening of mental status for last 3 days and he has been unsteady and spending more time in his room.  On examination, he is oriented to self only, has slurred speech, bilateral tremors in both upper extremities, and weakness in b/l upper and lower extremities with rigidity and increased tone. CT showed no acute intracranial abnormality and small chronic infarct within the left cerebellar hemisphere.  CT cervical spine showed no acute fracture of cervical spine.  MRI (04/24/2020) showed moderate generalized cortical atrophy, old lacunar infarct in the left inferior cerebellar hemisphere. Pt had temp of 100.5 yesterday. Urine toxicology was positive for benzodiazepines.  Urinalysis was normal.  Lactic acid  1.3, AST mildly elevated at 51, creatinine 0.91.  Valproic acid level is low at 26, lithium level low at 0.54.  Chart Review indicate that Pt has a history of Bipolar and has been taking Depakote (since 1999), Risperidone, and lithium for years.  He is also on Cogentin and Xanax. Depakote has been tapered off and changed to Lamictal recently.  Patient was recently seen by [facility] on 04/07/2020.  His tremors were believed to be related to his long-term Depakote use.  Patient also has a diagnosis of alcohol induced chronic  encephalitis. There could be multiple reasons or a combination of reasons for Pt's presentation.  DDs includes drug induced, encephalopathy, stroke or infection. We will order MRI brain to rule out any acute pathology.  We will also hold off on Depakote, Resperidone and Xanax. Recommend continuing Lithium, Cogentin and Lamictal.   ROS   Couldn't do as Pt is unable to answer.   Past History   Past Medical History:  Diagnosis Date  . 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HCC)   . Bipolar 1 disorder (HCC)   . Cerebellar disorder   . Epilepsy (HCC)   . Hypothyroid   . Insomnia   . Other postablative hypothyroidism   . Stutter   . Tremor   . Unspecified condition of brain   . Wernicke's encephalopathy    Past Surgical History:  Procedure Laterality Date  . NO PAST SURGERIES     Family History  Problem Relation Age of Onset  . Congestive Heart Failure Mother   . Cancer Father    Social History   Socioeconomic History  . Marital status: Single    Spouse name: Not on file  . Number of children: 1  . Years of education: n/a  . Highest education level: Not on file  Occupational History  . Occupation: Disabled  Tobacco Use  . Smoking status: Former Games developer  . Smokeless tobacco: Never Used  Substance and Sexual Activity  . Alcohol use: Not Currently    Comment: quit 1999  . Drug use: No  . Sexual activity: Not on file  Other Topics Concern  . Not on file  Social History Narrative   Lives with son   Right handed   Caffeine use: daily use   Social Determinants of Health   Financial Resource Strain: Not on file  Food Insecurity: Not on file  Transportation Needs: Not on file  Physical Activity: Not on file  Stress: Not on file  Social Connections: Not on file   No Known Allergies  Medications  (Not in a hospital admission)    Vitals   Vitals:   05/13/20 1100 05/13/20 1115 05/13/20 1130 05/13/20 1300  BP: (!) 144/70   (!) 155/67  Pulse: 69 78 91 64  Resp:  16 17 (!) 24 20  Temp:    98.6 F (37 C)  TempSrc:    Oral  SpO2: 98% 96% 98% 98%     There is no height or weight on file to calculate BMI.  Physical Exam   General: Laying comfortably in bed; Not in acute distress. Oriented to self only. Doesn't know date, time or where he is. HENT: Dry oropharynx and mucosa. Normal external appearance of ears and nose.  Neck: Supple, no pain or tenderness  CV: No JVD. No peripheral edema.  Pulmonary: Symmetric Chest rise. Normal respiratory effort. Abdomen: Non distended Ext: No cyanosis, edema, or deformity  Skin: No rash. Normal palpation of skin.   Musculoskeletal: Normal digits and nails by inspection. No clubbing.   Neurologic Examination  Mental status/Cognition: Oriented to self only. Doesn't know date, time or where he is. Doesn't follow commands. Makes Mumbling sounds.  Speech/language: Makes Mumbling sounds. Understands but doesn't talk. Speech improves during the course of evaluation.  Cranial nerves:   CN II Pupils equal and reactive to light, no VF deficits    CN III,IV,VI EOM intact, no gaze preference or deviation, no nystagmus    CN V Unable to test as Pt didn't respond.   CN VII Asymmetry noted with Rt Facial droop but it went away with grimace, Ptosis of Lt eyelid.   CN VIII normal hearing to speech    CN IX & X normal palatal elevation, no uvular deviation    CN XI Not able to follow commands   CN XII midline tongue protrusion    Motor:  Muscle bulk: Normal, tone increased,  Severe tremors present B/L UE,tremors increases with movements.  Hand grip 5/5 in B/L hands. Weakness in B/L UE and LE present. Able to lift off both LE's off the bed.  Tone is increased with rigidity.  Reflexes:  Right Left Comments  Pectoralis      Biceps (C5/6) 2+ 2+   Brachioradialis (C5/6) 2+ 2+    Triceps (C6/7) 2+ 2+    Patellar (L3/4) 1+ 1+    Achilles (S1) 1+ 1+    Hoffman ++ ++    Plantar Mute Mute   Jaw jerk    Sensation:   Light touch Unable to assess, Pt is unable to respond.   Pin prick    Temperature    Vibration   Proprioception    Coordination/Complex Motor:  - Finger to Nose - Incoordination present  -Gait- Deffered  Labs   CBC:  Recent Labs  Lab 05/12/20 1632 05/12/20 1711  WBC 9.6  --   NEUTROABS 7.1  --   HGB 12.3* 13.3  HCT 39.1 39.0  MCV 100.3*  --   PLT 229  --     Basic Metabolic Panel:  Lab Results  Component Value Date   NA 139 05/12/2020   K  4.5 05/12/2020   CO2 27 05/12/2020   GLUCOSE 115 (H) 05/12/2020   BUN 19 05/12/2020   CREATININE 0.91 05/12/2020   CALCIUM 9.5 05/12/2020   GFRNONAA >60 05/12/2020   GFRAA 100 04/07/2020   Lipid Panel: No results found for: LDLCALC HgbA1c: No results found for: HGBA1C Urine Drug Screen:     Component Value Date/Time   LABOPIA NONE DETECTED 05/13/2020 0326   COCAINSCRNUR NONE DETECTED 05/13/2020 0326   LABBENZ POSITIVE (A) 05/13/2020 0326   AMPHETMU NONE DETECTED 05/13/2020 0326   THCU NONE DETECTED 05/13/2020 0326   LABBARB NONE DETECTED 05/13/2020 0326    Alcohol Level     Component Value Date/Time   ETH <10 05/12/2020 1632    CT Head without contrast: 1. No evidence of acute intracranial abnormality. 2. Mild generalized atrophy of the brain with moderate chronic small vessel ischemic disease. 3. 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 4. Paranasal sinus disease as described   Impression   Trevor Cook is a 75 y.o. male with PMH significant for Wenicke's encephalopathy. His neurologic examination is notable for Aphasia, oriented to self only, B/L tremors, mumbling speech which was improved during evaluation, weakness in UE and LE, rigidity, hyperreflexia and increased tone. No acute intracranial abnormality on CT.  No acute fracture on CT cervical spine.  Urine tox +ve for benzodiazepines. Valproic acid level is low at 26, lithium level low at 0.54. Chart review indicate that  Pt has been taking Depakote for years and seen neurologist recently who put him on Lamictal with plan to taper Depakote due to worsening tremors.  He also takes Lithium, Risperidone, cogentin and xanax. Risperidone also causes EPS. We suspect his symptoms could be due to drug induced vs encephalopathy vs new stroke. Less likely stroke as CT negative for acute abnormality but We will order MRI brain to rule out any acute pathology.  We will also hold off on Depakote, Resperidone and Xanax. Recommend continuing Lithium, Cogentin and Lamictal. Recommend Thiamin and Folic acid. Will also recommend Psych consult for medication management for Bipolar. Neurology will Continue to follow.   Recommendations  - Hold Depakote, Risperidone, and Xanax. - MRI brain WO contrast. -Continue Lithium, Cogentin, and Lamictal. -Avoid antipsychotics -Delirium Precautions. - Thiamine ordered -F/U blood culture -PT/OT Eval and Treat -Multivitamin Daily -Monitor Vitals. -Continue Folic acid -Am CMP -Recommend Psych consult for Bipolar medication management.  Neurology will Continue to follow.  ______________________________________________________________________   Thank you for the opportunity to take part in the care of this patient. If you have any further questions, please contact the neurology consultation attending.  Signed,  Dr. Karsten Ro MD PGY1 [facility] Neurology _ _ _   _ __   _ __ _ _  __ __   _ __   __ _

## 2020-05-13 NOTE — ED Notes (Addendum)
The pt is not alert enough to do a nih modified or otherwise he keeps both his eyes closed and mumbles in between laughing  He appears drunk   He has wet the bed he pulled off the condom catheter that was placed earlier,  All his clothes have been removed and the pt placed in a hospital gown  He does not answer questions asked iv fluids still running at 35ml/hr.  NEW CONDOM CATHETER PLACED

## 2020-05-13 NOTE — ED Notes (Signed)
THIS IS NOT AN ACCURATE NIH  THE PT MOVES  ALL EXTREMITIES WHEN HES NOT ASKED RT ARM AND RT LEG LESS MOVEMENT.  HE CAN MAKE A FIST BUT WHEN ASKED TO SQUEEZE WITH EITHER HAND HE DOES NOTHING

## 2020-05-13 NOTE — Progress Notes (Signed)
PROGRESS NOTE  Trevor Cook MVV:[id number] DOB: [birth year] DOA: 05/12/2020 PCP: Aida Puffer, MD  Brief History   Trevor Cook is a 75 y.o. male with medical history significant of EtOH related chronic encephalitis, HTN, hypothyroidism, bipolar disease.  Patient at lives home with his son and his son's wife. Patient has a history of Warnicke's encephalopathy. He also has a history of bipolar disorder. Patient son reports that the patient has not had alcohol since 1998. He reports at baseline, patient does have some amount of dementia but is up and ambulatory. He goes out to an out building to do hobbies and projects. Patient does not drive a car. Patient's son reports at the end of the summer, the neurologist suspected he had had a stroke due to some increased imbalance and occasional speech changes. Patient's son and his son's wife report they noted a change over about the past 3 days. Patient seem like he might be sick. He seemed to be spending more time in his room and he seemed more unsteady they have not identified any fevers. Patient has been eating fairly well. He has not had any vomiting or diarrhea. He did have a recent medication change. They are tapering off Depakote and starting Lamictal. Patient is chronically on lithium. Change was made by the neurologist for increasing tremor. Today, the patient's son found the patient face down by his recliner in his room. He had fallen. His son did not see him fall. He tried to help him up but he was extremely weak and unsteady. This was atypical. He was sent to the emergency department by EMS for further evaluation. Patient cannot add anything to the history patient mumbles some unintelligible responses and makes a few intelligible words but no coherent history. .  ED Course: T 100.5  140/57  HR 69  RR 14. EDP exam notable for some swelling in the left periorbital aarea and conjunctiva, tremor. Lab - Covid NEGATIVE, Troponin #1  20, BNP 83, Li 0.54, TSH 1.284, WBC 9.6, Hgb 12.3, U/A negatvie. CT head no acute injury. CT C-spine no acute fx. CXR NAD. TRH called to admit for encephalopathy.  The patient is being roomed in the ED pending availability of a bed upstairs. Neurology has been consulted. They are checking a lithium level and an MRI brain.  Consultants  . Neurology  Procedures  . None  Antibiotics   Anti-infectives (From admission, onward)   None    .  Subjective  The patient is sleeping soundly. He will awaken briefly, but then go right back to sleep.  Objective   Vitals:  Vitals:   05/13/20 1300 05/13/20 1400  BP: (!) 155/67 139/61  Pulse: 64 60  Resp: 20 20  Temp: 98.6 F (37 C) 98.1 F (36.7 C)  SpO2: 98% 98%   Exam:  Constitutional:  . The patient is sleeping soundly. He will awaken briefly to stimulation, but then go right back to sleep. Respiratory:  . No increased work of breathing. . No wheezes, rales, or rhonchi . No tactile fremitus Cardiovascular:  . Regular rate and rhythm . No murmurs, ectopy, or gallups. . No lateral PMI. No thrills. Abdomen:  . Abdomen is soft, non-tender, non-distended . No hernias, masses, or organomegaly . Normoactive bowel sounds.  Musculoskeletal:  . No cyanosis, clubbing, or edema Skin:  . No rashes, lesions, ulcers . palpation of skin: no induration or nodules Neurologic:  . Unable to evaluate as the patient is unable to cooperate with exam. Psychiatric:  Unable to evaluate as the patient is unable to cooperate with exam.  I have personally reviewed the following:   Today's Data  . Vitals, UA, UDS, ABG, H&H, lithium level, CMP, UA  Micro Data  . Blood cultures x 2 pending  Imaging  . CT head . CT C-spine  Cardiology Data  . EKG  Scheduled Meds: . enoxaparin (LOVENOX) injection  40 mg Subcutaneous Q24H  . levothyroxine  50 mcg Oral QAC supper  . lithium carbonate  900 mg Oral QPC supper  . multivitamin with minerals  1  tablet Oral Daily  . [START ON 05/21/2020] thiamine injection  100 mg Intravenous Daily   Continuous Infusions: . sodium chloride 50 mL/hr at 05/13/20 0252  . thiamine injection 500 mg (05/13/20 1430)   Followed by  . [START ON 05/15/2020] thiamine injection      Active Problems:   Tremor   Essential hypertension   Hypothyroidism   Korsakoff syndrome (HCC)   Acute metabolic encephalopathy   Encephalopathy   LOS: 0 days   A & P   Acute metabolic encephalopathy - he does have EtOH related chronic encephalitis peer Dr. Terrace Arabia (neurology) office note and tremor. He is different from his baseline. Labs seem unremarkable but he does have a fever with no obvious source. Is transitioning to lamictal. MR! 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 Son reports that there has been a significant change in gait and balance and short term memory. Also more mania and paranoia over past weeks. The patient has been admitted to a med/surg bed. Sedating medications have been held. I have consulted neurology to help determine if the changes that the family is seeing are reversible or due to progression of the patient's baseline encephalopathy.   Debility: PT/OT to evaluate and treat. The patient's son states that they are no longer able to care for him at home. TOC consulted for placement. He will likely require a memory care facility.  HTN: Continue home meds  Hypothyroidism: TSH in normal range. Continue levo thyroxine as at home.  Dementia: Noted.   I have seen and examined this patient myself. I have spent 36 minutes in his evaluation and care.  DVT prophylaxis: lovenox  Code Status: DNR/DNI  Family Communication: Spoke with son at length. All questions answered to the best of my ability. Disposition Plan: TBD - TOC    Trevor Englert, DO [facility] Direct contact: see [email or web address]  7PM-7AM contact night coverage as above 05/13/2020, 3:26 PM  LOS: 0 days

## 2020-05-13 NOTE — ED Notes (Signed)
Attempted report x1. Nurse Secretary states RN will call back.

## 2020-05-13 NOTE — ED Notes (Signed)
MS, Breakfast Order placed

## 2020-05-13 NOTE — ED Notes (Signed)
ED TO INPATIENT HANDOFF REPORT  ED Nurse Name and Phone #: Gwenyth Bender [phone]  S Name/Age/Gender Trevor Cook 74 y.o. male Room/Bed: 054C/054C  Code Status   Code Status: DNR  Home/SNF/Other Home Patient oriented to: self Is this baseline? 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, worsening over past 4-6 months; no obvious injuries noted by ems; pt not on thinners; pt "babbles", which per family is baseline; incontinent of urine; would not tolerate c-collar with ems    Allergies No Known Allergies  Level of Care/Admitting Diagnosis ED Disposition    ED Disposition Condition Comment   Admit  Hospital Area: [facility] [[id number]]  Level of Care: Med-Surg [16]  I expect the patient will be discharged within 24 hours: No (not a candidate for 5C-Observation unit)  Covid Evaluation: Confirmed COVID Negative  Diagnosis: Encephalopathy [[id number]]  Admitting Physician: Jacques Navy [[id number]]  Attending Physician: Jacques Navy [[id number]]       B Medical/Surgery History Past Medical History:  Diagnosis Date  . 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HCC)   . Bipolar 1 disorder (HCC)   . Cerebellar disorder   . Epilepsy (HCC)   . Hypothyroid   . Insomnia   . Other postablative hypothyroidism   . Stutter   . Tremor   . Unspecified condition of brain   . Wernicke's encephalopathy    Past Surgical History:  Procedure Laterality Date  . NO PAST SURGERIES       A IV Location/Drains/Wounds Patient Lines/Drains/Airways Status    Active Line/Drains/Airways    Name Placement date Placement time Site Days   Peripheral IV 05/12/20 Right Antecubital 05/12/20  1702  Antecubital  1          Intake/Output Last 24 hours  Intake/Output Summary (Last 24 hours) at 05/13/2020 1452 Last data filed at 05/13/2020  0616 Gross per 24 hour  Intake 2000 ml  Output 300 ml  Net 1700 ml    Labs/Imaging Results for orders placed or performed during the hospital encounter of 05/12/20 (from the past 48 hour(s))  Comprehensive metabolic panel     Status: Abnormal   Collection Time: 05/12/20  4:32 PM  Result Value Ref Range   Sodium 138 135 - 145 mmol/L   Potassium 4.4 3.5 - 5.1 mmol/L   Chloride 102 98 - 111 mmol/L   CO2 27 22 - 32 mmol/L   Glucose, Bld 115 (H) 70 - 99 mg/dL    Comment: Glucose reference range applies only to samples taken after fasting for at least 8 hours.   BUN 19 8 - 23 mg/dL   Creatinine, Ser 1.19 0.61 - 1.24 mg/dL   Calcium 9.5 8.9 - 14.7 mg/dL   Total Protein 6.5 6.5 - 8.1 g/dL   Albumin 3.7 3.5 - 5.0 g/dL   AST 51 (H) 15 - 41 U/L   ALT 32 0 - 44 U/L   Alkaline Phosphatase 51 38 - 126 U/L   Total Bilirubin 0.5 0.3 - 1.2 mg/dL   GFR, Estimated >82 >95 mL/min    Comment: (NOTE) Calculated using the CKD-EPI Creatinine Equation (2021)    Anion gap 9 5 - 15    Comment: Performed at [facility] Lab, [address], [place], [place] [address]  Ethanol     Status: None   Collection Time: 05/12/20  4:32 PM  Result Value Ref Range   Alcohol, Ethyl (B) <10 <10 mg/dL    Comment: (NOTE) Lowest detectable limit for serum alcohol is 10 mg/dL.  For medical purposes only. Performed at [facility] Lab, [address], [place], [place] [address]   Lipase, blood     Status: None   Collection Time: 05/12/20  4:32 PM  Result Value Ref Range   Lipase 20 11 - 51 U/L    Comment: Performed at [facility], [address] [place], [place] [address]  Troponin I (High Sensitivity)     Status: None   Collection Time: 05/12/20  4:32 PM  Result Value Ref Range   Troponin I (High Sensitivity) 14 <18 ng/L    Comment: (NOTE) Elevated high sensitivity troponin I (hsTnI) values and significant  changes across serial measurements may suggest ACS but many other  chronic and acute  conditions are known to elevate hsTnI results.  Refer to the "Links" section for chest pain algorithms and additional  guidance. Performed at [facility], [address], [place], [place] [address]   Lactic acid, plasma     Status: None   Collection Time: 05/12/20  4:32 PM  Result Value Ref Range   Lactic Acid, Venous 1.3 0.5 - 1.9 mmol/L    Comment: Performed at [facility], [address] [place], [place] [address]  CBC with Differential     Status: Abnormal   Collection Time: 05/12/20  4:32 PM  Result Value Ref Range   WBC 9.6 4.0 - 10.5 K/uL   RBC 3.90 (L) 4.22 - 5.81 MIL/uL   Hemoglobin 12.3 (L) 13.0 - 17.0 g/dL   HCT 16.9 67.8 - 93.8 %   MCV 100.3 (H) 80.0 - 100.0 fL   MCH 31.5 26.0 - 34.0 pg   MCHC 31.5 30.0 - 36.0 g/dL   RDW 10.1 75.1 - 02.5 %   Platelets 229 150 - 400 K/uL   nRBC 0.0 0.0 - 0.2 %   Neutrophils Relative % 75 %   Neutro Abs 7.1 1.7 - 7.7 K/uL   Lymphocytes Relative 10 %   Lymphs Abs 0.9 0.7 - 4.0 K/uL   Monocytes Relative 15 %   Monocytes Absolute 1.4 (H) 0.1 - 1.0 K/uL   Eosinophils Relative 0 %   Eosinophils Absolute 0.0 0.0 - 0.5 K/uL   Basophils Relative 0 %   Basophils Absolute 0.0 0.0 - 0.1 K/uL   Immature Granulocytes 0 %   Abs Immature Granulocytes 0.04 0.00 - 0.07 K/uL    Comment: Performed at [facility], [address], [place], [place] [address]  Protime-INR     Status: None   Collection Time: 05/12/20  4:32 PM  Result Value Ref Range   Prothrombin Time 13.4 11.4 - 15.2 seconds   INR 1.1 0.8 - 1.2    Comment: (NOTE) INR goal varies based on device and disease states. Performed at [facility], [address], [place], [place] [address]   Magnesium     Status: None   Collection Time: 05/12/20  4:32 PM  Result Value Ref Range   Magnesium 2.1 1.7 - 2.4 mg/dL    Comment: Performed at [facility], [address], [place], [place] [address]  Phosphorus     Status: None   Collection Time: 05/12/20   4:32 PM  Result Value Ref Range   Phosphorus 4.0 2.5 - 4.6 mg/dL  Comment: Performed at [facility], [address] [place], [place] [address]  Valproic acid level     Status: Abnormal   Collection Time: 05/12/20  4:32 PM  Result Value Ref Range   Valproic Acid Lvl 26 (L) 50.0 - 100.0 ug/mL    Comment: Performed at [facility], [address] [place], [place] [address]  Ammonia     Status: None   Collection Time: 05/12/20  4:33 PM  Result Value Ref Range   Ammonia 23 9 - 35 umol/L    Comment: Performed at [facility], [address] [place], [place] [address]  Lithium level     Status: Abnormal   Collection Time: 05/12/20  4:42 PM  Result Value Ref Range   Lithium Lvl 0.54 (L) 0.60 - 1.20 mmol/L    Comment: Performed at [facility], [address] [place], [place] [address]  Brain natriuretic peptide     Status: None   Collection Time: 05/12/20  5:03 PM  Result Value Ref Range   B Natriuretic Peptide 83.0 0.0 - 100.0 pg/mL    Comment: Performed at [facility], [address] [place], [place] [address]  TSH     Status: None   Collection Time: 05/12/20  5:03 PM  Result Value Ref Range   TSH 1.384 0.350 - 4.500 uIU/mL    Comment: Performed by a 3rd Generation assay with a functional sensitivity of <=0.01 uIU/mL. Performed at [facility], [address] [place], [place] [address]   I-Stat venous blood gas, ED     Status: Abnormal   Collection Time: 05/12/20  5:11 PM  Result Value Ref Range   pH, Ven 7.391 7.250 - 7.430   pCO2, Ven 49.9 44.0 - 60.0 mmHg   pO2, Ven 35.0 32.0 - 45.0 mmHg   Bicarbonate 30.3 (H) 20.0 - 28.0 mmol/L   TCO2 32 22 - 32 mmol/L   O2 Saturation 66.0 %   Acid-Base Excess 4.0 (H) 0.0 - 2.0 mmol/L   Sodium 139 135 - 145 mmol/L   Potassium 4.5 3.5 - 5.1 mmol/L   Calcium, Ion 1.24 1.15 - 1.40 mmol/L   HCT 39.0 39.0 - 52.0 %   Hemoglobin 13.3 13.0 - 17.0 g/dL   Sample type VENOUS    Comment NOTIFIED PHYSICIAN    Resp Panel by RT-PCR (Flu A&B, Covid) Nasopharyngeal Swab     Status: None   Collection Time: 05/12/20  8:21 PM   Specimen: Nasopharyngeal Swab; Nasopharyngeal(NP) swabs in vial transport medium  Result Value Ref Range   SARS Coronavirus 2 by RT PCR NEGATIVE NEGATIVE    Comment: (NOTE) SARS-CoV-2 target nucleic acids are NOT DETECTED.  The SARS-CoV-2 RNA is generally detectable in upper respiratory specimens during the acute phase of infection. The lowest concentration of SARS-CoV-2 viral copies this assay can detect is 138 copies/mL. A negative result does not preclude SARS-Cov-2 infection and should not be used as the sole basis for treatment or other patient management decisions. A negative result may occur with  improper specimen collection/handling, submission of specimen other than nasopharyngeal swab, presence of viral mutation(s) within the areas targeted by this assay, and inadequate number of viral copies(<138 copies/mL). A negative result must be combined with clinical observations, patient history, and epidemiological information. The expected result is Negative.  Fact Sheet for Patients:  BloggerCourse.comhttps://www.fda.gov/media/152166/download  Fact Sheet for Healthcare Providers:  SeriousBroker.ithttps://www.fda.gov/media/152162/download  This test is no t  yet approved or cleared by the [place] FDA and  has been authorized for detection and/or diagnosis of SARS-CoV-2 by FDA under an Emergency Use Authorization (EUA). This EUA will remain  in effect (meaning this test can be used) for the duration of the COVID-19 declaration under Section 564(b)(1) of the Act, 21 U.S.C.section 360bbb-3(b)(1), unless the authorization is terminated  or revoked sooner.       Influenza A by PCR NEGATIVE NEGATIVE   Influenza B by PCR NEGATIVE NEGATIVE    Comment: (NOTE) The Xpert Xpress SARS-CoV-2/FLU/RSV plus assay is intended as an aid in the diagnosis of influenza from Nasopharyngeal swab specimens  and should not be used as a sole basis for treatment. Nasal washings and aspirates are unacceptable for Xpert Xpress SARS-CoV-2/FLU/RSV testing.  Fact Sheet for Patients: BloggerCourse.comhttps://www.fda.gov/media/152166/download  Fact Sheet for Healthcare Providers: SeriousBroker.ithttps://www.fda.gov/media/152162/download  This test is not yet approved or cleared by the [place] FDA and has been authorized for detection and/or diagnosis of SARS-CoV-2 by FDA under an Emergency Use Authorization (EUA). This EUA will remain in effect (meaning this test can be used) for the duration of the COVID-19 declaration under Section 564(b)(1) of the Act, 21 U.S.C. section 360bbb-3(b)(1), unless the authorization is terminated or revoked.  Performed at [facility], [address] [place], [place] [address]   Lactic acid, plasma     Status: None   Collection Time: 05/12/20  9:10 PM  Result Value Ref Range   Lactic Acid, Venous 0.9 0.5 - 1.9 mmol/L    Comment: Performed at [facility], [address] [place], [place] [address]  Troponin I (High Sensitivity)     Status: Abnormal   Collection Time: 05/12/20  9:10 PM  Result Value Ref Range   Troponin I (High Sensitivity) 20 (H) <18 ng/L    Comment: (NOTE) Elevated high sensitivity troponin I (hsTnI) values and significant  changes across serial measurements may suggest ACS but many other  chronic and acute conditions are known to elevate hsTnI results.  Refer to the "Links" section for chest pain algorithms and additional  guidance. Performed at [facility], [address] [place], [place] [address]   Urinalysis, Routine w reflex microscopic     Status: Abnormal   Collection Time: 05/13/20  3:25 AM  Result Value Ref Range   Color, Urine YELLOW YELLOW   APPearance CLEAR CLEAR   Specific Gravity, Urine 1.024 1.005 - 1.030   pH 6.0 5.0 - 8.0   Glucose, UA NEGATIVE NEGATIVE mg/dL   Hgb urine dipstick NEGATIVE NEGATIVE   Bilirubin Urine  NEGATIVE NEGATIVE   Ketones, ur 20 (A) NEGATIVE mg/dL   Protein, ur 30 (A) NEGATIVE mg/dL   Nitrite NEGATIVE NEGATIVE   Leukocytes,Ua NEGATIVE NEGATIVE   RBC / HPF 6-10 0 - 5 RBC/hpf   WBC, UA 0-5 0 - 5 WBC/hpf   Bacteria, UA NONE SEEN NONE SEEN   Mucus PRESENT     Comment: Performed at [facility], [address] [place], [place] [address]  Urine rapid drug screen (hosp performed)     Status: Abnormal   Collection Time: 05/13/20  3:26 AM  Result Value Ref Range   Opiates NONE DETECTED NONE DETECTED   Cocaine NONE DETECTED NONE DETECTED   Benzodiazepines POSITIVE (A) NONE DETECTED   Amphetamines NONE DETECTED NONE DETECTED   Tetrahydrocannabinol NONE DETECTED NONE DETECTED   Barbiturates NONE DETECTED NONE DETECTED    Comment: (NOTE) DRUG  SCREEN FOR MEDICAL PURPOSES ONLY.  IF CONFIRMATION IS NEEDED FOR ANY PURPOSE, NOTIFY LAB WITHIN 5 DAYS.  LOWEST DETECTABLE LIMITS FOR URINE DRUG SCREEN Drug Class                     Cutoff (ng/mL) Amphetamine and metabolites    1000 Barbiturate and metabolites    200 Benzodiazepine                 200 Tricyclics and metabolites     300 Opiates and metabolites        300 Cocaine and metabolites        300 THC                            50 Performed at [facility] Lab, [address], [place], [place] [address]    CT Head Wo Contrast  Result Date: 05/12/2020 CLINICAL DATA:  Head trauma, minor. Neck trauma. Additional history obtained from electronic MEDICAL RECORD NUMBERUnwitnessed fall today. EXAM: CT HEAD WITHOUT CONTRAST CT CERVICAL SPINE WITHOUT CONTRAST TECHNIQUE: Multidetector CT imaging of the head and cervical spine was performed following the standard protocol without intravenous contrast. Multiplanar CT image reconstructions of the cervical spine were also generated. COMPARISON:  Brain MRI 04/24/2020.  Head CT 04/20/2010. FINDINGS: CT HEAD FINDINGS Brain: Mild cerebral and cerebellar atrophy. Moderate ill-defined  hypoattenuation within the cerebral white matter is nonspecific, but compatible with chronic small vessel ischemic disease. 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 There is no acute intracranial hemorrhage. No demarcated cortical infarct. No extra-axial fluid collection. No evidence of intracranial mass. No midline shift. Vascular: No hyperdense vessel.  Atherosclerotic calcifications. Skull: Normal. Negative for fracture or focal lesion. Sinuses/Orbits: Visualized orbits show no acute finding. Mild paranasal sinus mucosal thickening, most notably within the left frontal, bilateral ethmoid and bilateral maxillary sinuses. Small foci of polypoid mucosal thickening versus mucous retention cysts within the bilateral maxillary sinuses. CT CERVICAL SPINE FINDINGS Alignment: Cervical levocurvature, possibly positional. Mild nonspecific reversal of the expected cervical lordosis. 2 mm C2-C3 grade 1 anterolisthesis. Trace C7-T1 grade 1 anterolisthesis. Skull base and vertebrae: The basion-dental and atlanto-dental intervals are maintained.No evidence of acute fracture to the cervical spine. Mild age-indeterminate anterior wedge compression deformity of the T1 vertebral body (series 6, image 29). Soft tissues and spinal canal: No prevertebral fluid or swelling. No visible canal hematoma. Disc levels: Cervical spondylosis with multilevel disc space narrowing, disc bulges, uncovertebral hypertrophy and facet arthrosis. Disc space narrowing is moderate/advanced at C4-C5, C5-C6 and C6-C7. facet joint ankylosis on the right and possibly on the left at C2-C3. Upper chest: No consolidation within the imaged lung apices. No visible pneumothorax. IMPRESSION: CT head: 1. No evidence of acute intracranial abnormality. 2. Mild generalized atrophy of the brain with moderate chronic small vessel ischemic disease. 3. 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 4. Paranasal  sinus disease as described. CT cervical spine: 1. Mild age-indeterminate anterior wedge compression deformity of the T1 vertebral body. 2. No evidence of acute fracture to the cervical spine. 3. Nonspecific reversal of the expected cervical lordosis. 4. Cervical levocurvature, possibly positional. 5. Mild C2-C3 and C7-T1 grade 1 anterolisthesis. 6. Cervical spondylosis as described. 7. 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 Electronically Signed   By: Jackey Loge DO   On: 05/12/2020 18:05   CT Cervical Spine Wo Contrast  Result Date: 05/12/2020 CLINICAL DATA:  Head  trauma, minor. Neck trauma. Additional history obtained from electronic MEDICAL RECORD NUMBERUnwitnessed fall today. EXAM: CT HEAD WITHOUT CONTRAST CT CERVICAL SPINE WITHOUT CONTRAST TECHNIQUE: Multidetector CT imaging of the head and cervical spine was performed following the standard protocol without intravenous contrast. Multiplanar CT image reconstructions of the cervical spine were also generated. COMPARISON:  Brain MRI 04/24/2020.  Head CT 04/20/2010. FINDINGS: CT HEAD FINDINGS Brain: Mild cerebral and cerebellar atrophy. Moderate ill-defined hypoattenuation within the cerebral white matter is nonspecific, but compatible with chronic small vessel ischemic disease. 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 There is no acute intracranial hemorrhage. No demarcated cortical infarct. No extra-axial fluid collection. No evidence of intracranial mass. No midline shift. Vascular: No hyperdense vessel.  Atherosclerotic calcifications. Skull: Normal. Negative for fracture or focal lesion. Sinuses/Orbits: Visualized orbits show no acute finding. Mild paranasal sinus mucosal thickening, most notably within the left frontal, bilateral ethmoid and bilateral maxillary sinuses. Small foci of polypoid mucosal thickening versus mucous retention cysts within the bilateral maxillary sinuses. CT CERVICAL SPINE FINDINGS Alignment: Cervical  levocurvature, possibly positional. Mild nonspecific reversal of the expected cervical lordosis. 2 mm C2-C3 grade 1 anterolisthesis. Trace C7-T1 grade 1 anterolisthesis. Skull base and vertebrae: The basion-dental and atlanto-dental intervals are maintained.No evidence of acute fracture to the cervical spine. Mild age-indeterminate anterior wedge compression deformity of the T1 vertebral body (series 6, image 29). Soft tissues and spinal canal: No prevertebral fluid or swelling. No visible canal hematoma. Disc levels: Cervical spondylosis with multilevel disc space narrowing, disc bulges, uncovertebral hypertrophy and facet arthrosis. Disc space narrowing is moderate/advanced at C4-C5, C5-C6 and C6-C7. facet joint ankylosis on the right and possibly on the left at C2-C3. Upper chest: No consolidation within the imaged lung apices. No visible pneumothorax. IMPRESSION: CT head: 1. No evidence of acute intracranial abnormality. 2. Mild generalized atrophy of the brain with moderate chronic small vessel ischemic disease. 3. 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 4. Paranasal sinus disease as described. CT cervical spine: 1. Mild age-indeterminate anterior wedge compression deformity of the T1 vertebral body. 2. No evidence of acute fracture to the cervical spine. 3. Nonspecific reversal of the expected cervical lordosis. 4. Cervical levocurvature, possibly positional. 5. Mild C2-C3 and C7-T1 grade 1 anterolisthesis. 6. Cervical spondylosis as described. 7. 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 Electronically Signed   By: Jackey Loge DO   On: 05/12/2020 18:05   DG Chest Port 1 View  Result Date: 05/12/2020 CLINICAL DATA:  Dementia worsening over the last 4-6 months. EXAM: PORTABLE CHEST 1 VIEW COMPARISON:  04/20/2010 FINDINGS: Cardiac silhouette is normal in size. No mediastinal or hilar masses. Clear lungs.  No pleural effusion or pneumothorax. Skeletal structures are grossly intact.  IMPRESSION: No acute cardiopulmonary disease. Electronically Signed   By: Amie Portland M.D.   On: 05/12/2020 17:18    Pending Labs Unresulted Labs (From admission, onward)          Start     Ordered   05/20/20 0500  Creatinine, serum  (enoxaparin (LOVENOX)    CrCl >/= 30 ml/min)  Weekly,   R     Comments: while on enoxaparin therapy    05/13/20 0129   05/12/20 1646  Lamotrigine level  Once,   STAT        05/12/20 1645   05/12/20 1632  Blood gas, venous  Once,   STAT        05/12/20 1634  Vitals/Pain Today's Vitals   05/13/20 1115 05/13/20 1130 05/13/20 1300 05/13/20 1400  BP:   (!) 155/67 139/61  Pulse: 78 91 64 60  Resp: 17 (!) 24 20 20   Temp:   98.6 F (37 C) 98.1 F (36.7 C)  TempSrc:   Oral Oral  SpO2: 96% 98% 98% 98%  PainSc:        Isolation Precautions No active isolations  Medications Medications  lithium carbonate capsule 900 mg (has no administration in time range)  levothyroxine (SYNTHROID) tablet 50 mcg (has no administration in time range)  multivitamin with minerals tablet 1 tablet (0 tablets Oral Hold 05/13/20 1100)  enoxaparin (LOVENOX) injection 40 mg (40 mg Subcutaneous Given 05/13/20 1438)  0.45 % sodium chloride infusion ( Intravenous New Bag/Given 05/13/20 0252)  acetaminophen (TYLENOL) tablet 650 mg (has no administration in time range)    Or  acetaminophen (TYLENOL) suppository 650 mg (has no administration in time range)  thiamine 500mg  in normal saline (50ml) IVPB (500 mg Intravenous New Bag/Given 05/13/20 1430)    Followed by  thiamine (B-1) 250 mg in sodium chloride 0.9 % 50 mL IVPB (has no administration in time range)    Followed by  thiamine (B-1) injection 100 mg (has no administration in time range)  sodium chloride 0.9 % 1,000 mL with thiamine 100 mg, folic acid 1 mg, multivitamins adult 10 mL infusion ( Intravenous Stopped 05/13/20 0202)    Mobility non-ambulatory       Cardiac Rhythm: Sinus bradycardia Recommendations:  See Admitting Provider Note  Report given to:   Additional Notes:

## 2020-05-13 NOTE — ED Notes (Signed)
Pt's son in ED; requesting to speak to admitting MD regarding father's plan of care. Paged Dr. Gerri Lins.

## 2020-05-13 NOTE — ED Notes (Signed)
Patient transported to [facility].

## 2020-05-13 NOTE — ED Notes (Signed)
Patient transported to MRI. Notified Mariam, RN on [facility] patient will be delayed coming upstairs d/t MRI procedure.

## 2020-05-14 DIAGNOSIS — G9340 Encephalopathy, unspecified: Secondary | ICD-10-CM | POA: Diagnosis not present

## 2020-05-14 DIAGNOSIS — I10 Essential (primary) hypertension: Secondary | ICD-10-CM | POA: Diagnosis not present

## 2020-05-14 LAB — COMPREHENSIVE METABOLIC PANEL
ALT: 29 U/L (ref 0–44)
AST: 48 U/L — ABNORMAL HIGH (ref 15–41)
Albumin: 3 g/dL — ABNORMAL LOW (ref 3.5–5.0)
Alkaline Phosphatase: 39 U/L (ref 38–126)
Anion gap: 8 (ref 5–15)
BUN: 20 mg/dL (ref 8–23)
CO2: 23 mmol/L (ref 22–32)
Calcium: 8.8 mg/dL — ABNORMAL LOW (ref 8.9–10.3)
Chloride: 102 mmol/L (ref 98–111)
Creatinine, Ser: 0.96 mg/dL (ref 0.61–1.24)
GFR, Estimated: >60 mL/min (ref >60)
Glucose, Bld: 118 mg/dL — ABNORMAL HIGH (ref 70–99)
Potassium: 4.5 mmol/L (ref 3.5–5.1)
Sodium: 133 mmol/L — ABNORMAL LOW (ref 135–145)
Total Bilirubin: 1.3 mg/dL — ABNORMAL HIGH (ref 0.3–1.2)
Total Protein: 5.7 g/dL — ABNORMAL LOW (ref 6.5–8.1)

## 2020-05-14 LAB — CBC WITH DIFFERENTIAL/PLATELET
Abs Immature Granulocytes: 0.01 10*3/uL (ref 0.00–0.07)
Basophils Absolute: 0.1 10*3/uL (ref 0.0–0.1)
Basophils Relative: 1 %
Eosinophils Absolute: 0.1 10*3/uL (ref 0.0–0.5)
Eosinophils Relative: 1 %
HCT: 35.8 % — ABNORMAL LOW (ref 39.0–52.0)
Hemoglobin: 12.2 g/dL — ABNORMAL LOW (ref 13.0–17.0)
Immature Granulocytes: 0 %
Lymphocytes Relative: 15 %
Lymphs Abs: 1 10*3/uL (ref 0.7–4.0)
MCH: 32.5 pg (ref 26.0–34.0)
MCHC: 34.1 g/dL (ref 30.0–36.0)
MCV: 95.5 fL (ref 80.0–100.0)
Monocytes Absolute: 1.1 10*3/uL — ABNORMAL HIGH (ref 0.1–1.0)
Monocytes Relative: 17 %
Neutro Abs: 4.2 10*3/uL (ref 1.7–7.7)
Neutrophils Relative %: 66 %
Platelets: 197 10*3/uL (ref 150–400)
RBC: 3.75 MIL/uL — ABNORMAL LOW (ref 4.22–5.81)
RDW: 12.1 % (ref 11.5–15.5)
WBC: 6.5 10*3/uL (ref 4.0–10.5)
nRBC: 0 % (ref 0.0–0.2)

## 2020-05-14 NOTE — Progress Notes (Signed)
PROGRESS NOTE  Trevor Cook YDX:[id number] DOB: 07-13-1945 DOA: 05/12/2020 PCP: Aida Puffer, MD  Brief History   Trevor Cook is a 75 y.o. male with medical history significant of EtOH related chronic encephalitis, HTN, hypothyroidism, bipolar disease.  Patient at lives home with his son and his son's wife. Patient has a history of Warnicke's encephalopathy. He also has a history of bipolar disorder. Patient son reports that the patient has not had alcohol since 1998. He reports at baseline, patient does have some amount of dementia but is up and ambulatory. He goes out to an out building to do hobbies and projects. Patient does not drive a car. Patient's son reports at the end of the summer, the neurologist suspected he had had a stroke due to some increased imbalance and occasional speech changes. Patient's son and his son's wife report they noted a change over about the past 3 days. Patient seem like he might be sick. He seemed to be spending more time in his room and he seemed more unsteady they have not identified any fevers. Patient has been eating fairly well. He has not had any vomiting or diarrhea. He did have a recent medication change. They are tapering off Depakote and starting Lamictal. Patient is chronically on lithium. Change was made by the neurologist for increasing tremor. Today, the patient's son found the patient face down by his recliner in his room. He had fallen. His son did not see him fall. He tried to help him up but he was extremely weak and unsteady. This was atypical. He was sent to the emergency department by EMS for further evaluation. Patient cannot add anything to the history patient mumbles some unintelligible responses and makes a few intelligible words but no coherent history. .  ED Course: T 100.5  140/57  HR 69  RR 14. EDP exam notable for some swelling in the left periorbital aarea and conjunctiva, tremor. Lab - Covid NEGATIVE, Troponin #1  20, BNP 83, Li 0.54, TSH 1.284, WBC 9.6, Hgb 12.3, U/A negatvie. CT head no acute injury. CT C-spine no acute fx. CXR NAD. TRH called to admit for encephalopathy.  The patient is being roomed in the ED pending availability of a bed upstairs. Neurology has been consulted. They are checking a lithium level and an MRI brain.  Neurology was consulted to determine if further investigation needed to be performed to elucidate the causes of the patient's recent decline. It seems that it did not. Neurology has signed off. They have recommended evaluation by psychiatry to fine tune the patient's psychiatric medications. I have held any sedating medications.                                                   Consultants  . Neurology  Procedures  . None  Antibiotics   Anti-infectives (From admission, onward)   None     Subjective  The patient is sleeping soundly. He will awaken briefly, but then go right back to sleep.  Objective   Vitals:  Vitals:   05/14/20 0428 05/14/20 1116  BP: 127/65 (!) 131/59  Pulse: 67 64  Resp: 20 (!) 21  Temp: 99.3 F (37.4 C) 98.5 F (36.9 C)  SpO2: 99% 96%   Exam:  Constitutional:  . The patient is sleeping soundly. He will awaken briefly to stimulation, but then  go right back to sleep. Respiratory:  . No increased work of breathing. . No wheezes, rales, or rhonchi . No tactile fremitus Cardiovascular:  . Regular rate and rhythm . No murmurs, ectopy, or gallups. . No lateral PMI. No thrills. Abdomen:  . Abdomen is soft, non-tender, non-distended . No hernias, masses, or organomegaly . Normoactive bowel sounds.  Musculoskeletal:  . No cyanosis, clubbing, or edema Skin:  . No rashes, lesions, ulcers . palpation of skin: no induration or nodules Neurologic:  . Unable to evaluate as the patient is unable to cooperate with exam. Psychiatric:  Unable to evaluate as the patient is unable to cooperate with exam.  I have personally reviewed the  following:   Today's Data  . Vitals, CMP, CBC  Micro Data  . Blood cultures x 2 pending  Imaging  . CT head . CT C-spine  Cardiology Data  . EKG  Scheduled Meds: . enoxaparin (LOVENOX) injection  40 mg Subcutaneous Q24H  . levothyroxine  50 mcg Oral QAC supper  . lithium carbonate  900 mg Oral QPC supper  . multivitamin with minerals  1 tablet Oral Daily  . [START ON 05/21/2020] thiamine injection  100 mg Intravenous Daily   Continuous Infusions: . sodium chloride 50 mL/hr at 05/13/20 2027  . thiamine injection 500 mg (05/14/20 1345)   Followed by  . [START ON 05/15/2020] thiamine injection      Active Problems:   Tremor   Essential hypertension   Hypothyroidism   Korsakoff syndrome (HCC)   Acute metabolic encephalopathy   Encephalopathy   Toxic metabolic encephalopathy   Drug-induced parkinsonism (HCC)   Fall   LOS: 1 day   A & P   Acute metabolic encephalopathy - he does have EtOH related chronic encephalitis peer Dr. Terrace Arabia (neurology) office note and tremor. He is different from his baseline. Labs seem unremarkable but he does have a fever with no obvious source. Is transitioning to lamictal. MR! 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 Son reports that there has been a significant change in gait and balance and short term memory. Also more mania and paranoia over past weeks. The patient has been admitted to a med/surg bed. Sedating medications have been held. I have consulted neurology to help determine if the changes that the family is seeing are reversible or due to progression of the patient's baseline encephalopathy.   Debility: PT/OT to evaluate and treat. The patient's son states that they are no longer able to care for him at home. TOC consulted for placement. He will likely require a memory care facility.  HTN: Continue home meds  Hypothyroidism: TSH in normal range. Continue levo thyroxine as at home.  Dementia: Noted. Psychiatry will be  consulted to fine tune the patient's psychiatric medications.  I have seen and examined this patient myself. I have spent 36 minutes in his evaluation and care.  DVT prophylaxis: lovenox  Code Status: DNR/DNI  Family Communication: Spoke with son at length. All questions answered to the best of my ability. Disposition Plan: TBD - TOC    Ghazi Rumpf, DO [facility] Direct contact: see [email or web address]  7PM-7AM contact night coverage as above 05/14/2020, 4:22 PM  LOS: 0 days

## 2020-05-14 NOTE — Progress Notes (Signed)
NEUROLOGY CONSULTATION PROGRESS NOTE   Date of service: May 14, 2020 Patient Name: Trevor Cook MRN:  [id number] DOB:  Jan 24, 1946  Brief HPI  Trevor Cook is a 75 y.o. male with PMH significant for alcohol use, bipolar disorder, history of Warnicke's encephalopathy, Depakote induced parkinsonism and tremors, hypothyroidism presents with 3-day history of progressive encephalopathy.  Difficult to assess on exam if there was any aphasia out of proportion to his encephalopathy.  However, MRI brain without contrast is negative for an acute stroke.  It is notable for extensive chronic microvascular disease, cerebral atrophy and a prior small left cerebellar remote stroke.  Exam with encephalopathy, dysarthric speech, parkinsonism with bradyphrenia, bradykinesia, rigidity and tremor.  Work-up with CT head and MRI brain without contrast is negative for an acute abnormality.  He is afebrile, CBC and chemistry nonconcerning for a infection or significant electrolyte abnormality.  UA and chest x-ray not concerning for an infection. Thiamine is being replaced at Wernicke's doses, encephalopathy work-up with TSH normal, ammonia normal, lithium and Depakote levels are low, recent B12 levels and folate levels are within normal limits.  Recent RPR is negative.  Drug screen is positive for benzos but he is also prescribed Ativan as needed for sleep.  Due to the noted parkinsonian features and encephalopathy, Depakote, risperidone and Xanax were held.   Interval Hx   Was able to sit at the edge of the bed with PT. Is hungry, asking for food. Dysarthria significantly improved when conversing about food. Mumbles intermittently. Continues to have parkinsonian features but somewhat improved.  Vitals   Vitals:   05/13/20 1400 05/13/20 1719 05/13/20 2317 05/14/20 0428  BP: 139/61 (!) 154/71 (!) 153/70 127/65  Pulse: 60 69 79 67  Resp: 20 16 (!) 22 20  Temp: 98.1 F (36.7 C) (!) 100.7 F (38.2 C) 99.6 F (37.6  C) 99.3 F (37.4 C)  TempSrc: Oral Oral Oral   SpO2: 98% 94% 97% 99%     There is no height or weight on file to calculate BMI.  Physical Exam   General: Laying comfortably in bed; in no acute distress.  HENT: Normal oropharynx and mucosa. Normal external appearance of ears and nose.  Neck: Supple, no pain or tenderness  CV: No JVD. No peripheral edema.  Pulmonary: Symmetric Chest rise. Normal respiratory effort.  Abdomen: Soft to touch, non-tender.  Ext: No cyanosis, edema, or deformity  Skin: No rash. Normal palpation of skin.   Musculoskeletal: Normal digits and nails by inspection. No clubbing.   Neurologic Examination  Mental status/Cognition: Alert, oriented to self, place, month and year, good attention.  Speech/language: Fluent, comprehension intact, object naming intact, repetition intact.  Cranial nerves:   CN II Pupils equal and reactive to light, no VF deficits    CN III,IV,VI EOM intact, no gaze preference or deviation, no nystagmus    CN V normal sensation in V1, V2, and V3 segments bilaterally    CN VII no asymmetry, no nasolabial fold flattening    CN VIII normal hearing to speech    CN IX & X normal palatal elevation, no uvular deviation    CN XI 5/5 head turn and 5/5 shoulder shrug bilaterally    CN XII midline tongue protrusion    Motor:  Muscle bulk: normal, tone increased, pronator drift none tremor yes, parkisonian tremor at rest and with movement. Mvmt Root Nerve  Muscle Right Left Comments  SA C5/6 Ax Deltoid     EF C5/6 Mc Biceps  EE C6/7/8 Rad Triceps     WF C6/7 Med FCR     WE C7/8 PIN ECU     F Ab C8/T1 U ADM/FDI 5 5   HF L1/2/3 Fem Illopsoas 5 5   KE L2/3/4 Fem Quad     DF L4/5 D Peron Tib Ant     PF S1/2 Tibial Grc/Sol      Reflexes:  Right Left Comments  Pectoralis      Biceps (C5/6) 2 2   Brachioradialis (C5/6) 2 2    Triceps (C6/7) 2 2    Patellar (L3/4) 1 1    Achilles (S1)      Hoffman      Plantar     Jaw jerk     Sensation:  Light touch Intact throughout   Pin prick    Temperature    Vibration   Proprioception    Coordination/Complex Motor:  - Finger to Nose with ataxia. - Rapid alternating movement are slowed. - Gait: Deferred.  Labs   Basic Metabolic Panel:  Lab Results  Component Value Date   NA 133 (L) 05/14/2020   K 4.5 05/14/2020   CO2 23 05/14/2020   GLUCOSE 118 (H) 05/14/2020   BUN 20 05/14/2020   CREATININE 0.96 05/14/2020   CALCIUM 8.8 (L) 05/14/2020   GFRNONAA >60 05/14/2020   GFRAA 100 04/07/2020   HbA1c: No results found for: HGBA1C LDL: No results found for: [facility] Urine Drug Screen:     Component Value Date/Time   LABOPIA NONE DETECTED 05/13/2020 0326   COCAINSCRNUR NONE DETECTED 05/13/2020 0326   LABBENZ POSITIVE (A) 05/13/2020 0326   AMPHETMU NONE DETECTED 05/13/2020 0326   THCU NONE DETECTED 05/13/2020 0326   LABBARB NONE DETECTED 05/13/2020 0326    Alcohol Level     Component Value Date/Time   ETH <10 05/12/2020 1632   No results found for: PHENYTOIN, ZONISAMIDE, LAMOTRIGINE, LEVETIRACETA Lab Results  Component Value Date   VALPROATE 26 (L) 05/12/2020    Imaging and Diagnostic studies   MR BRAIN WO CONTRAST IMPRESSION: No evidence of recent infarction, hemorrhage, or mass. Stable findings of parenchymal volume loss chronic microvascular ischemic changes, and small left cerebellar infarct.   Impression   Trevor Cook is a 75 y.o. male with PMH significant for alcohol use, bipolar disorder, history of Warnicke's encephalopathy, Depakote induced parkinsonism and tremors, hypothyroidism presents with 3-day history of progressive encephalopathy. His neurologic examination is notable for Parkinsonian features, encephalopathy and some concern for aphasia out of proportion to encephalopathy. Encephalopathy has significantly improved, no aphasia on exam today, dysarthria resolves when he is having a conversation. Suspect that parkinsonian features  are potentially due to Valproic acid and Risperidone.  MRI Brain without contrast with no significant abnormality.  Recommendations  - Recommend having Psych team help with gradually reintroducing medications - Continue Thiamine high dose replacement for now - can switch to PO 100mg  daily at discharge. - Continue Multivitamin - Recommend Aspirin 81mg  daily for the noted remote left cerebellar stroke - Follow up with Dr. his outpatient neurologist for further stroke workup outpatient and for further management. Neurology inpatient team will signoff. Please feel free to contact with any questions or concerns. ______________________________________________________________________   Thank you for the opportunity to take part in the care of this patient. If you have any further questions, please contact the neurology consultation attending.  Signed,  Levert Feinstein [facility] Pager Number [phone]

## 2020-05-14 NOTE — Evaluation (Signed)
Physical Therapy Evaluation Patient Details Name: Trevor Cook MRN: [id number] DOB: 03/23/1946 Today's Date: 05/14/2020   History of Present Illness    75 y.o.malewith medical history significant ofEtOH related chronic encephalitis, HTN, hypothyroidism, bipolar disease. Patient has a history of Warnicke's encephalopathy. He also has a history of bipolar disorder. Patient son reports that the patient has not had alcohol since 1998. Today, the patient's son found the patient face down by his recliner in his room. He had fallen. His son did not see him fall. He tried to help him up but he was extremely weak and unsteady. This was atypical. He was sent to the emergency department by EMS for further evaluation. Patient cannot add anything to the history patient mumbles some unintelligible responses and makes a few intelligible words but no coherent history..   Clinical Impression  Pt presents with moderate to severe functional deficits due to weakness, tremors, motor control deficits, changes in consciousness/cognition impacting his ability to move around and change positions. Is able to express self with moderate effort, but limited by fatigue and tires easily. At current functional level, recommend SNF level therapies, however is symptoms clear, pt could feasibly return home assuming son is able to provide necessary care safely. PT will initiate care in acute setting and update d/c recommendations if presentation changes.     Follow Up Recommendations SNF (if symptoms clear, could pt return home with HHPT?)    Equipment Recommendations  Rolling walker with 5" wheels    Recommendations for Other Services       Precautions / Restrictions Precautions Precautions: Fall Precaution Comments: weak, tremors      Mobility  Bed Mobility Overal bed mobility: Needs Assistance Bed Mobility: Rolling;Supine to Sit;Sit to Supine Rolling: Min assist   Supine to sit: Mod assist Sit to  supine: Min assist   General bed mobility comments: needs tactile cues and minimal assit to roll for bed pad repositioning, and to initiate/continue in/out of bed; quality of movement is stiff and slow, with pt to appear to need to work up momentum to move; able to scoot to left on command with several attempts, occassionally unweighting bottom enough to minimize friction/drag    Transfers Overall transfer level:  (pt fatigued, returned to supine)                  Ambulation/Gait                Stairs            Wheelchair Mobility    Modified Rankin (Stroke Patients Only)       Balance Overall balance assessment: Needs assistance;History of Falls Sitting-balance support: Feet supported;Bilateral upper extremity supported Sitting balance-Leahy Scale: Poor Sitting balance - Comments: initially unable to maintain upright, falling backwards, corrected with hands on thighs and pt then able to [facility] for 5 min.  Scooting as described above needing multiple attempts to reposition for return to supine Postural control: Posterior lean                                   Pertinent Vitals/Pain Pain Assessment: Faces Faces Pain Scale: Hurts a little bit Pain Location: unsure, generally achy from lying in bed    Home Living Family/patient expects to be discharged to:: Private residence Living Arrangements: Children Available Help at Discharge: Available PRN/intermittently (to be confirmed, no family and pt unreliable)  Additional Comments: lives with son, unsure home set up    Prior Function Level of Independence: Needs assistance   Gait / Transfers Assistance Needed: supervision? no device noted in chart but pt reportedly ambulatory and does hobbies outside at baseline           Hand Dominance        Extremity/Trunk Assessment   Upper Extremity Assessment Upper Extremity Assessment: Defer to OT evaluation;Generalized  weakness;Difficult to assess due to impaired cognition (able to find target with hands, but tremulous and unable to maintain activity)    Lower Extremity Assessment Lower Extremity Assessment: Generalized weakness;Difficult to assess due to impaired cognition (able to move legs off/onto bed surface, needs tactile cues occassionally to complete movement tasks)       Communication   Communication: Expressive difficulties (difficulty producing words)  Cognition Arousal/Alertness: Lethargic Behavior During Therapy: Flat affect Overall Cognitive Status: No family/caregiver present to determine baseline cognitive functioning                                 General Comments: per notes, pt has hx dementia and psych d/o; able to express self generally but struggles to produce words; cooperative and wants to get up, does not want to be left alone      General Comments General comments (skin integrity, edema, etc.): pt returned to supine and HOB up, pt expressed relief at new position and then fell asleep    Exercises     Assessment/Plan    PT Assessment Patient needs continued PT services  PT Problem List Decreased strength;Decreased range of motion;Decreased activity tolerance;Decreased balance;Decreased mobility;Decreased coordination;Decreased cognition;Decreased knowledge of use of DME;Decreased safety awareness;Decreased knowledge of precautions       PT Treatment Interventions DME instruction;Gait training;Functional mobility training;Therapeutic activities;Therapeutic exercise;Balance training;Neuromuscular re-education;Cognitive remediation    PT Goals (Current goals can be found in the Care Plan section)  Acute Rehab PT Goals Patient Stated Goal: to get out PT Goal Formulation: Patient unable to participate in goal setting Time For Goal Achievement: 05/28/20 Potential to Achieve Goals: Good    Frequency Min 3X/week   Barriers to discharge Decreased caregiver  support son, daughter in law    Co-evaluation               AM-PAC PT "6 Clicks" Mobility  Outcome Measure Help needed turning from your back to your side while in a flat bed without using bedrails?: A Lot Help needed moving from lying on your back to sitting on the side of a flat bed without using bedrails?: A Lot Help needed moving to and from a bed to a chair (including a wheelchair)?: A Lot Help needed standing up from a chair using your arms (e.g., wheelchair or bedside chair)?: A Lot Help needed to walk in hospital room?: A Lot Help needed climbing 3-5 steps with a railing? : Total 6 Click Score: 11    End of Session   Activity Tolerance: Patient limited by fatigue Patient left: in bed;with call bell/phone within reach;with bed alarm set Nurse Communication: Mobility status PT Visit Diagnosis: Muscle weakness (generalized) (M62.81);History of falling (Z91.81);Unsteadiness on feet (R26.81)    Time: 7628-3151 PT Time Calculation (min) (ACUTE ONLY): 25 min   Charges:   PT Evaluation $PT Eval Moderate Complexity: 1 Mod          Narda Amber, PT, DPT, MS Board Certified Geriatric Clinical Specialist   Daphine Deutscher,  Norva Pavlov 05/14/2020, 9:33 AM

## 2020-05-15 DIAGNOSIS — I10 Essential (primary) hypertension: Secondary | ICD-10-CM | POA: Diagnosis not present

## 2020-05-15 DIAGNOSIS — F319 Bipolar disorder, unspecified: Secondary | ICD-10-CM

## 2020-05-15 MED ORDER — ALPRAZOLAM 0.5 MG PO TABS: 0.5000 mg | ORAL_TABLET | Freq: Two times a day (BID) | ORAL | Status: DC | PRN

## 2020-05-15 MED ORDER — BENZTROPINE MESYLATE 0.5 MG PO TABS
0.5000 mg | ORAL_TABLET | Freq: Two times a day (BID) | ORAL | Status: DC
  Administered 2020-05-15 – 2020-05-17 (×4): 0.5 mg via ORAL
  Filled 2020-05-15 (×8): qty 1

## 2020-05-15 MED ORDER — RISPERIDONE 0.5 MG PO TABS
0.5000 mg | ORAL_TABLET | Freq: Two times a day (BID) | ORAL | Status: DC
  Administered 2020-05-15 – 2020-05-17 (×4): 0.5 mg via ORAL
  Filled 2020-05-15 (×8): qty 1

## 2020-05-15 NOTE — TOC Initial Note (Addendum)
Transition of Care [facility]) - Initial/Assessment Note    Patient Details  Name: Trevor Cook MRN: [id number] Date of Birth: February 27, 1946  Transition of Care [facility]) CM/SW Contact:    Jimmy Picket, [place] Phone Number: 05/15/2020, 1:03 PM  Clinical Narrative:                 CSW spoke to pts son on the phone. Son, Mazen stated that pt was living at home with him and his wife. Oval stated that pt was able to ambulate independently. Pt was able to complete ADLs with ques. Milind stated that his and his where about to provide 18-20 hours of supervision a day. Stony reports the pt is covid vaccinated.   CSW reviewed SNF reccs for pt. Kwame stated he believes SNF is his best option. Loy wonders if he will need long term care after. Virgilio states  Its harder to care for him now. [facility] has no preferences in facilities and gave CSW permission to fax out and call and give wife bed offers. CSW explained medicare.gov rating list.   4:55pm- CSW gave pts daughter in law bed offers by phone.   TOC will follow.   Expected Discharge Plan: Skilled Nursing Facility Barriers to Discharge: Continued Medical Work up   Patient Goals and CMS Choice Patient states their goals for this hospitalization and ongoing recovery are:: to gain strenght CMS Medicare.gov Compare Post Acute Care list provided to:: Patient Represenative (must comment) Choice offered to / list presented to : Adult Children  Expected Discharge Plan and Services Expected Discharge Plan: Skilled Nursing Facility   Discharge Planning Services: CM Consult   Living arrangements for the past 2 months: Single Family Home                                      Prior Living Arrangements/Services Living arrangements for the past 2 months: Single Family Home Lives with:: Adult Children Patient language and need for interpreter reviewed:: Yes Do you feel safe going back to the place where you live?: Yes      Need for Family Participation in  Patient Care: Yes (Comment) Care giver support system in place?: Yes (comment)   Criminal Activity/Legal Involvement Pertinent to Current Situation/Hospitalization: No - Comment as needed  Activities of Daily Living      Permission Sought/Granted Permission sought to share information with : Family Electrical engineer Permission granted to share information with : Yes, Verbal Permission Granted     Permission granted to share info w AGENCY: SNF  Permission granted to share info w Relationship: Son     Emotional Assessment Appearance:: Appears stated age Attitude/Demeanor/Rapport: Unable to Assess Affect (typically observed): Unable to Assess Orientation: : Oriented to Situation,Oriented to Self Alcohol / Substance Use: Not Applicable Psych Involvement: No (comment)  Admission diagnosis:  Encephalopathy [G93.40] Toxic metabolic encephalopathy [G92.8] Fall, initial encounter [W19.XXXA] Severe comorbid illness [R69] Patient Active Problem List   Diagnosis Date Noted  . Acute metabolic encephalopathy 05/13/2020  . Encephalopathy 05/13/2020  . Toxic metabolic encephalopathy 05/13/2020  . Drug-induced parkinsonism (HCC)   . Fall   . History of alcohol abuse 04/07/2020  . Tremor 09/20/2013  . Essential hypertension 09/20/2013  . Hypothyroidism 09/20/2013  . 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/20/2013   PCP:  Aida Puffer, MD Pharmacy:   [facility] [phone] - [place],  - [address] AT [facility]  [address] [place] [address] Phone: [phone] Fax: [phone]     Social Determinants of Health (SDOH) Interventions    Readmission Risk Interventions No flowsheet data found.  Jimmy Picket, Theresia Majors, [place] Clinical Social Worker [phone]

## 2020-05-15 NOTE — Evaluation (Addendum)
Occupational Therapy Evaluation Patient Details Name: Trevor Cook MRN: [id number] DOB: [birth year] Today's Date: 05/15/2020    History of Present Illness 75 y.o. male with PMH significant for 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HCC), Bipolar 1 disorder (HCC), Cerebellar disorder, Epilepsy (HCC), Hypothyroid, Insomnia, Other postablative hypothyroidism, Stutter, Tremor, Unspecified condition of brain, and Wernicke's encephalopathy. who presents with Altered Mental Status.   Clinical Impression   Pt admitted with the above diagnoses and presents with below problem list. Pt will benefit from continued acute OT to address the below listed deficits and maximize independence with basic ADLs prior to d/c to venue below. Per chart review, pt was ambulatory PTA, unclear what his baseline assist level is with ADLs. Pt is unable to provide history and no family present during eval. Pt currently max-total A with UB/LB ADLs including eating. Mod to max/total A to roll to each side. Would need +2 assist to safely attempt EOB/OOB transfers. Cognition, tremors, lethargy (falls asleep quickly) and rigidity/generalized weakness (R side > L side) impacting current assist level.      Follow Up Recommendations  SNF    Equipment Recommendations  Other (comment) (TBD next venue)    Recommendations for Other Services       Precautions / Restrictions Precautions Precautions: Fall Precaution Comments: weak, tremors Restrictions Weight Bearing Restrictions: No      Mobility Bed Mobility Overal bed mobility: Needs Assistance Bed Mobility: Rolling Rolling: Mod assist;Max assist;Total assist         General bed mobility comments: mod-max A to roll to right side with pt able to reach LUE across body with cueing to hold rail/therapist arm. max-total to roll to left side. Stiff and slow movements.    Transfers                 General transfer comment: unable to assess this session. Would need +2  to safely attempt.    Balance                                           ADL either performed or assessed with clinical judgement   ADL Overall ADL's : Needs assistance/impaired Eating/Feeding: Total assistance;Bed level   Grooming: Maximal assistance;Bed level Grooming Details (indicate cue type and reason): able to hold washcloth with L hand to wipe mouth a little bit. Max A for throughness and full completion of grooming tasks Upper Body Bathing: Total assistance;Bed level   Lower Body Bathing: Total assistance;Bed level;+2 for safety/equipment;+2 for physical assistance   Upper Body Dressing : Maximal assistance;Total assistance;Bed level   Lower Body Dressing: Total assistance;Bed level;+2 for physical assistance;+2 for safety/equipment                 General ADL Comments: Pt able to roll to his right side with mod-max A. total A to roll to left side. Pt unable to clear back from elevated HOB with hands placed on rails for setup (trialing longsitting).     Vision         Perception     Praxis      Pertinent Vitals/Pain Pain Assessment: Faces Faces Pain Scale: Hurts a little bit Pain Location: unspecified. Pain Intervention(s): Monitored during session     Hand Dominance Right   Extremity/Trunk Assessment Upper Extremity Assessment Upper Extremity Assessment: Generalized weakness;Difficult to assess due to impaired cognition;RUE deficits/detail;LUE deficits/detail RUE Deficits / Details: tremors during  AAROM/PROM. 3/5 MMT elbow level, 2/5 shoulder level, wrist level. Could complete composite flexion but struggled with composite extention. Cognition and tremors impacting BUE assessment. RUE Coordination: decreased fine motor;decreased gross motor LUE Deficits / Details: able to reach LUE across body to faciliate rolling to right side. 3/5 gross strength. Able to hold washcloth and wipe mouth with nondominant (per pt report) L hand. LUE  Coordination: decreased fine motor;decreased gross motor   Lower Extremity Assessment Lower Extremity Assessment: Overall WFL for tasks assessed;Generalized weakness (LLE seems stronger than RLE)       Communication Communication Communication: Expressive difficulties   Cognition Arousal/Alertness: Lethargic;Awake/alert (falls asleep quickly) Behavior During Therapy: Flat affect Overall Cognitive Status: History of cognitive impairments - at baseline                                     General Comments       Exercises Exercises: Other exercises Other Exercises Other Exercises: BUE and BLE AAROM at bed level, 5 reps each. Pt able to complete 2-3 reps AROM LLE hip flexion.   Shoulder Instructions      Home Living Family/patient expects to be discharged to:: Skilled nursing facility Living Arrangements: Children                               Additional Comments: lives with son, unsure home set up      Prior Functioning/Environment Level of Independence: Needs assistance  Gait / Transfers Assistance Needed: supervision? no device noted in chart but pt reportedly ambulatory and does hobbies outside at baseline              OT Problem List: Decreased strength;Decreased activity tolerance;Decreased range of motion;Impaired balance (sitting and/or standing);Decreased coordination;Decreased cognition;Decreased safety awareness;Decreased knowledge of use of DME or AE;Decreased knowledge of precautions;Impaired tone;Impaired UE functional use;Pain      OT Treatment/Interventions: Self-care/ADL training;Therapeutic exercise;DME and/or AE instruction;Therapeutic activities;Cognitive remediation/compensation;Patient/family education;Balance training    OT Goals(Current goals can be found in the care plan section) Acute Rehab OT Goals Patient Stated Goal: to get out OT Goal Formulation: Patient unable to participate in goal setting Time For Goal  Achievement: 05/29/20 Potential to Achieve Goals: Fair ADL Goals Pt Will Perform Grooming: with mod assist;bed level;sitting Pt Will Transfer to Toilet: with max assist;with +2 assist;stand pivot transfer;bedside commode Additional ADL Goal #1: Pt will complete bed mobility at mod A level to prepare for EOB/OOB ADLs Additional ADL Goal #2: Pt will sit EOB with min A for 1 minute to faciliate EOB ADLs.  OT Frequency: Min 2X/week   Barriers to D/C:            Co-evaluation              AM-PAC OT "6 Clicks" Daily Activity     Outcome Measure Help from another person eating meals?: Total Help from another person taking care of personal grooming?: A Lot Help from another person toileting, which includes using toliet, bedpan, or urinal?: Total Help from another person bathing (including washing, rinsing, drying)?: Total Help from another person to put on and taking off regular upper body clothing?: Total Help from another person to put on and taking off regular lower body clothing?: Total 6 Click Score: 7   End of Session    Activity Tolerance: Patient limited by lethargy;Patient limited by fatigue (falls  asleep quickly.) Patient left: in bed;with call bell/phone within reach;with bed alarm set  OT Visit Diagnosis: Unsteadiness on feet (R26.81);Other abnormalities of gait and mobility (R26.89);Muscle weakness (generalized) (M62.81);History of falling (Z91.81);Other symptoms and signs involving cognitive function;Other symptoms and signs involving the nervous system (R29.898);Cognitive communication deficit (R41.841);Pain                Time: 9311-2162 OT Time Calculation (min): 28 min Charges:  OT General Charges $OT Visit: 1 Visit OT Evaluation $OT Eval Moderate Complexity: 1 Mod OT Treatments $Self Care/Home Management : 8-22 mins  Raynald Kemp, OT Acute Rehabilitation Services Pager: [phone] Office: [phone]   Pilar Grammes 05/15/2020, 10:44 AM

## 2020-05-15 NOTE — Consult Note (Signed)
Trevor Enzweileris a 74 y.o.malewith medical history significant ofEtOH related chronic encephalitis, HTN, hypothyroidism, bipolar disease.Patientatlives home with his son and his son's wife. Patient has a history of Warnicke's encephalopathy. He also has a history of bipolar disorder. Patient son reports that the patient has not had alcohol since 1998. He reports at baseline, patient does have some amount of dementia but is up and ambulatory. He goes out to an out building to do hobbies and projects. Patient does not drive a car. Patient's son reports at the end of the summer, the neurologist suspected he had had a stroke due to some increased imbalance and occasional speech changes. Patient's son and his son's wife report they noted a change over about the past 3 days. Patient seem like he might be sick. He seemed to be spending more time in his room and he seemed more unsteady they have not identified any fevers. Patient has been eating fairly well. He has not had any vomiting or diarrhea. He did have a recent medication change. They are tapering off Depakote and starting Lamictal. Patient is chronically on lithium. Change was made by the neurologist for increasing tremor. Today, the patient's son found the patient face down by his recliner in his room. He had fallen. His son did not see him fall. He tried to help him up but he was extremely weak and unsteady. This was atypical. He was sent to the emergency department by EMS for further evaluation. Patient cannot add anything to the history patient mumbles some unintelligible responses and makes a few intelligible words but no coherent history.   Psych consult placed for please assist encephalopathic patient with dementia on chronic psychiatric medications. Per neurology "recommend having psych team help with gradually reintroducing medications. Patient unable to participate in psychiatric evaluation as he remains encephalopathic  and exhibiting dysarthric speech. His son is at the bedside and confirms medications listed above. He reports his dad was autonomous prior to 3 days ago.    Patient presented on 05/12/2020 after being found down by his sonwith altered mental status and weakness.  There were reports of recent falls.   MRI showed left cerebellar remote infarct.  Urine drug screen was positive for benzodiazapine in which he has an active prescription.  EEG not obtained. Patient has had a number of residual changes including increased weakness, tremors, parkinsonian features, dysarthric speech.    The patient was laying in the bed with son present when I entered the room.  He was alert and disoriented, eating a tootsie roll pop. The son acknowledges changes in his coordination and smooth execution of motor movements likely related to his cerebellar strokes.   Will resume home medications at this time to include: -Lamictal 100mg  po BID, Risperdal 1mg  po daily. Xanax 0.5mg  po BID prn, Lithium 900mg  po qhs, and cogentin 1mg  po BID. Per PDMP patient was not receiving Xanax on a routine basis (last filled 01/19/2020 for 2 year time span).  - Will obtain Lamictal level at this time.    Patient with history of alcoholic induced chronic encephalitis, currently stable on previous psychiatric medications that have been held due to sedation, and altered mental state upon admission. At this time will resume Risperdal at 50% dose.  -Will start risperdal 0.5mg  po BID  - will continue xanax 1mg  po BID prn -WIll resume Lamictal once, Level has resulted.   From a psychiatric standpoint patient is stable. Will encourage him to follow up with outpatient neurology.  -Recommend evaluating  with PT to determine his appropriate level of care SNF vs memory care unit. -Recommend working closely with SW to facilitate placement.  -Psych to sign off at this time.

## 2020-05-15 NOTE — Social Work (Signed)
To Whom It May Concern:  Please be advised that the above-named patient has a primary diagnosis of dementia which supersedes any psychiatric diagnosis.    

## 2020-05-15 NOTE — Progress Notes (Signed)
PROGRESS NOTE  Trevor Cook DGU:[id number] DOB: [birth year] DOA: 05/12/2020 PCP: Aida Puffer, MD  Brief History   Trevor Cook is a 75 y.o. male with medical history significant of EtOH related chronic encephalitis, HTN, hypothyroidism, bipolar disease.  Patient at lives home with his son and his son's wife. Patient has a history of Warnicke's encephalopathy. He also has a history of bipolar disorder. Patient son reports that the patient has not had alcohol since 1998. He reports at baseline, patient does have some amount of dementia but is up and ambulatory. He goes out to an out building to do hobbies and projects. Patient does not drive a car. Patient's son reports at the end of the summer, the neurologist suspected he had had a stroke due to some increased imbalance and occasional speech changes. Patient's son and his son's wife report they noted a change over about the past 3 days. Patient seem like he might be sick. He seemed to be spending more time in his room and he seemed more unsteady they have not identified any fevers. Patient has been eating fairly well. He has not had any vomiting or diarrhea. He did have a recent medication change. They are tapering off Depakote and starting Lamictal. Patient is chronically on lithium. Change was made by the neurologist for increasing tremor. Today, the patient's son found the patient face down by his recliner in his room. He had fallen. His son did not see him fall. He tried to help him up but he was extremely weak and unsteady. This was atypical. He was sent to the emergency department by EMS for further evaluation. Patient cannot add anything to the history patient mumbles some unintelligible responses and makes a few intelligible words but no coherent history. .  ED Course: T 100.5  140/57  HR 69  RR 14. EDP exam notable for some swelling in the left periorbital aarea and conjunctiva, tremor. Lab - Covid NEGATIVE, Troponin #1  20, BNP 83, Li 0.54, TSH 1.284, WBC 9.6, Hgb 12.3, U/A negatvie. CT head no acute injury. CT C-spine no acute fx. CXR NAD. TRH called to admit for encephalopathy.  The patient is being roomed in the ED pending availability of a bed upstairs. Neurology has been consulted. They are checking a lithium level and an MRI brain.  Neurology was consulted to determine if further investigation needed to be performed to elucidate the causes of the patient's recent decline. It seems that it did not. Neurology has signed off. Psychiatry has been consulted at the recommendation of neurology to fine tune the patient's psychiatric medications. I have held any sedating medications.                                                   Consultants  . Neurology  Procedures  . None  Antibiotics   Anti-infectives (From admission, onward)   None     Subjective  The patient is sleeping soundly. He is easily rousable.   Objective   Vitals:  Vitals:   05/15/20 0419 05/15/20 1229  BP: 113/75 108/73  Pulse: 87 87  Resp: 19 16  Temp: 97.8 F (36.6 C) (!) 97.4 F (36.3 C)  SpO2: 98% 97%   Exam:  Constitutional:  . The patient is sleeping soundly. He will awaken briefly to stimulation, but then go right back to  sleep. Respiratory:  . No increased work of breathing. . No wheezes, rales, or rhonchi . No tactile fremitus Cardiovascular:  . Regular rate and rhythm . No murmurs, ectopy, or gallups. . No lateral PMI. No thrills. Abdomen:  . Abdomen is soft, non-tender, non-distended . No hernias, masses, or organomegaly . Normoactive bowel sounds.  Musculoskeletal:  . No cyanosis, clubbing, or edema Skin:  . No rashes, lesions, ulcers . palpation of skin: no induration or nodules Neurologic:  . Unable to evaluate as the patient is unable to cooperate with exam. Psychiatric:  Unable to evaluate as the patient is unable to cooperate with exam.  I have personally reviewed the following:   Today's  Data  . Vitals, CMP, CBC  Micro Data  . Blood cultures x 2 pending  Imaging  . CT head . CT C-spine  Cardiology Data  . EKG  Scheduled Meds: . enoxaparin (LOVENOX) injection  40 mg Subcutaneous Q24H  . levothyroxine  50 mcg Oral QAC supper  . lithium carbonate  900 mg Oral QPC supper  . multivitamin with minerals  1 tablet Oral Daily  . [START ON 05/21/2020] thiamine injection  100 mg Intravenous Daily   Continuous Infusions: . sodium chloride 50 mL/hr at 05/14/20 2122  . thiamine injection 250 mg (05/15/20 1309)    Active Problems:   Tremor   Essential hypertension   Hypothyroidism   Korsakoff syndrome (HCC)   Acute metabolic encephalopathy   Encephalopathy   Toxic metabolic encephalopathy   Drug-induced parkinsonism (HCC)   Fall   LOS: 2 days   A & P   Acute metabolic encephalopathy - he does have EtOH related chronic encephalitis peer Dr. Terrace Arabia (neurology) office note and tremor. He is different from his baseline. Labs seem unremarkable but he does have a fever with no obvious source. Is transitioning to lamictal. MR! 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 Son reports that there has been a significant change in gait and balance and short term memory. Also more mania and paranoia over past weeks. The patient has been admitted to a med/surg bed. Sedating medications have been held. I have consulted neurology to help determine if the changes that the family is seeing are reversible or due to progression of the patient's baseline encephalopathy. Psychiatry has been consulted at the recommendation of neurology to fine tune the patient's psychiatric medications. I have held any sedating medications.   Debility: PT/OT to evaluate and treat. The patient's son states that they are no longer able to care for him at home. TOC consulted for placement. He will likely require a memory care facility.  HTN: Continue home meds  Hypothyroidism: TSH in normal range. Continue  levo thyroxine as at home.  Dementia: Noted. Psychiatry will be consulted to fine tune the patient's psychiatric medications.  I have seen and examined this patient myself. I have spent 34 minutes in his evaluation and care.  DVT prophylaxis: lovenox  Code Status: DNR/DNI  Family Communication: Spoke with son at length. All questions answered to the best of my ability. Disposition Plan: TBD - TOC    Agnes Probert, DO [facility] Direct contact: see [email or web address]  7PM-7AM contact night coverage as above 05/15/2020, 4:16 PM  LOS: 0 days

## 2020-05-15 NOTE — [place] FL2 (Signed)
[place] MEDICAID FL2 LEVEL OF CARE SCREENING TOOL     IDENTIFICATION  Patient Name: Trevor Cook Birthdate: 1946/01/10 Sex: male Admission Date (Current Location): 05/12/2020  [facility] and [place] Number:  Producer, television/film/video and Address:  [facility], [address] [place], [place] [address]      Provider Number: [id number]  Attending Physician Name and Address:  Fran Lowes, DO  Relative Name and Phone Number:       Current Level of Care: Hospital Recommended Level of Care: Skilled Nursing Facility Prior Approval Number:    Date Approved/Denied:   PASRR Number: pending  Discharge Plan: SNF    Current Diagnoses: Patient Active Problem List   Diagnosis Date Noted  . Acute metabolic encephalopathy 05/13/2020  . Encephalopathy 05/13/2020  . Toxic metabolic encephalopathy 05/13/2020  . Drug-induced parkinsonism (HCC)   . Fall   . History of alcohol abuse 04/07/2020  . Tremor 09/20/2013  . Essential hypertension 09/20/2013  . Hypothyroidism 09/20/2013  . 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/20/2013    Orientation RESPIRATION BLADDER Height & Weight     Self,Place  Normal Incontinent Weight:   Height:     BEHAVIORAL SYMPTOMS/MOOD NEUROLOGICAL BOWEL NUTRITION STATUS      Incontinent Diet (See discharge summary)  AMBULATORY STATUS COMMUNICATION OF NEEDS Skin   Extensive Assist Verbally Normal                       Personal Care Assistance Level of Assistance  Bathing,Feeding,Dressing Bathing Assistance: Maximum assistance Feeding assistance: Limited assistance Dressing Assistance: Maximum assistance     Functional Limitations Info  Sight,Hearing,Speech Sight Info: Adequate Hearing Info: Adequate Speech Info: Impaired    SPECIAL CARE FACTORS FREQUENCY  PT (By licensed PT),OT (By licensed OT)     PT Frequency: 5x a week OT Frequency: 5x a week            Contractures      Additional Factors Info  Code  Status,Allergies,Psychotropic Code Status Info: DNR Allergies Info: NKA Psychotropic Info: xanax         Current Medications (05/15/2020):  This is the current hospital active medication list Current Facility-Administered Medications  Medication Dose Route Frequency Provider Last Rate Last Admin  . 0.45 % sodium chloride infusion   Intravenous Continuous Norins, Rosalyn Gess, MD 50 mL/hr at 05/14/20 2122 New Bag at 05/14/20 2122  . acetaminophen (TYLENOL) tablet 650 mg  650 mg Oral Q6H PRN Jacques Navy, MD   650 mg at 05/13/20 1755   Or  . acetaminophen (TYLENOL) suppository 650 mg  650 mg Rectal Q6H PRN Norins, Rosalyn Gess, MD      . enoxaparin (LOVENOX) injection 40 mg  40 mg Subcutaneous Q24H Norins, Rosalyn Gess, MD   40 mg at 05/15/20 0911  . levothyroxine (SYNTHROID) tablet 50 mcg  50 mcg Oral QAC supper Jacques Navy, MD   50 mcg at 05/14/20 1631  . lithium carbonate capsule 900 mg  900 mg Oral QPC supper Jacques Navy, MD   900 mg at 05/14/20 1746  . multivitamin with minerals tablet 1 tablet  1 tablet Oral Daily Norins, Rosalyn Gess, MD   1 tablet at 05/15/20 0908  . ondansetron (ZOFRAN) injection 4 mg  4 mg Intravenous Q6H PRN Zierle-Ghosh, Asia B, DO      . thiamine 500mg  in normal saline (58ml) IVPB  500 mg Intravenous Q8H 45m, MD 100 mL/hr at 05/14/20 2227  500 mg at 05/14/20 2227   Followed by  . thiamine (B-1) 250 mg in sodium chloride 0.9 % 50 mL IVPB  250 mg Intravenous Daily Erick Blinks, MD       Followed by  . [START ON 05/21/2020] thiamine (B-1) injection 100 mg  100 mg Intravenous Daily Erick Blinks, MD         Discharge Medications: Please see discharge summary for a list of discharge medications.  Relevant Imaging Results:  Relevant Lab Results:   Additional Information SSN; [id number]  Jimmy Picket, [place]

## 2020-05-16 DIAGNOSIS — I10 Essential (primary) hypertension: Secondary | ICD-10-CM | POA: Diagnosis not present

## 2020-05-16 LAB — SARS CORONAVIRUS 2 (TAT 6-24 HRS): SARS Coronavirus 2: NEGATIVE

## 2020-05-16 MED ORDER — ASPIRIN EC 81 MG PO TBEC
81.0000 mg | DELAYED_RELEASE_TABLET | Freq: Every day | ORAL | Status: DC
  Administered 2020-05-16 – 2020-05-17 (×2): 81 mg via ORAL
  Filled 2020-05-16 (×2): qty 1

## 2020-05-16 NOTE — Progress Notes (Signed)
Physical Therapy Treatment Patient Details Name: Trevor Cook MRN: [id number] DOB: Apr 09, 1946 Today's Date: 05/16/2020    History of Present Illness 75 y.o. male with PMH significant for 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[place]), Bipolar 1 disorder ([place]), Cerebellar disorder, Epilepsy ([place]), Hypothyroid, Insomnia, Other postablative hypothyroidism, Stutter, Tremor, Unspecified condition of brain, and Wernicke's encephalopathy. who presents with Altered Mental Status.    PT Comments    Patient received in bed, lethargic. Needed MaxAx2 to get to EOB, but once sitting up command following did improve although he kept his eyes closed for most of the session. Able to perform functional transfers and actually walk a couple of feet in room with MinAx2 for safety/equipment and RW, although he did have a hard time with functional sequencing and use of RW today. Left up in recliner with all needs met, chair alarm active and nursing staff aware of patient status.  Progressing well but would still benefit from SNF.    Follow Up Recommendations  SNF     Equipment Recommendations  Rolling walker with 5" wheels    Recommendations for Other Services       Precautions / Restrictions Precautions Precautions: Fall Precaution Comments: weak, tremors Restrictions Weight Bearing Restrictions: No    Mobility  Bed Mobility Overal bed mobility: Needs Assistance Bed Mobility: Supine to Sit     Supine to sit: HOB elevated;Max assist;+2 for physical assistance     General bed mobility comments: MaxAx2 to helicopter to EOB with HOB elevated; did not really show much initation until he was sitting at EOB and was able to maintain midilne with min guard  Transfers Overall transfer level: Needs assistance Equipment used: Rolling walker (2 wheeled) Transfers: Sit to/from Stand Sit to Stand: Min assist;+2 physical assistance         General transfer comment: MinAx2 to boost up to standing in RW-  hand placement not ideal though. Actually able to boost all the way up to standing in steady with min guard-supervision of one person  Ambulation/Gait Ambulation/Gait assistance: Min assist;+2 physical assistance Gait Distance (Feet): 3 Feet Assistive device: Rolling walker (2 wheeled) Gait Pattern/deviations: Step-through pattern;Decreased step length - right;Decreased step length - left;Decreased stride length;Decreased stance time - right;Decreased stance time - left;Shuffle;Trunk flexed;Narrow base of support Gait velocity: decreased   General Gait Details: needed MinAx2 to walk short distance to the chair in his room- had a hard time managing RW and needed Max cues for safety/sequencing   Stairs             Wheelchair Mobility    Modified Rankin (Stroke Patients Only)       Balance Overall balance assessment: Needs assistance;History of Falls Sitting-balance support: Feet supported;Bilateral upper extremity supported Sitting balance-Leahy Scale: Fair Sitting balance - Comments: able to maintain midilne with MaxA initially but faded to close S-min guard to maintain midline with ongoing practice Postural control: Posterior lean   Standing balance-Leahy Scale: Poor Standing balance comment: reliant on BUE/external support                            Cognition Arousal/Alertness: Lethargic Behavior During Therapy: Flat affect Overall Cognitive Status: History of cognitive impairments - at baseline                                 General Comments: pt with hx of dementia and psych backgrond; very lethargic today  and kept eyes closed during session but cooperative and followed cues. Moaned a lot, lots of drooling and had a hard time producing words.      Exercises      General Comments        Pertinent Vitals/Pain Pain Assessment: Faces Faces Pain Scale: No hurt Pain Intervention(s): Limited activity within patient's tolerance;Monitored  during session    Home Living                      Prior Function            PT Goals (current goals can now be found in the care plan section) Acute Rehab PT Goals Patient Stated Goal: to get out PT Goal Formulation: Patient unable to participate in goal setting Time For Goal Achievement: 05/28/20 Potential to Achieve Goals: Good Progress towards PT goals: Progressing toward goals    Frequency    Min 3X/week      PT Plan Current plan remains appropriate    Co-evaluation              AM-PAC PT "6 Clicks" Mobility   Outcome Measure  Help needed turning from your back to your side while in a flat bed without using bedrails?: A Lot Help needed moving from lying on your back to sitting on the side of a flat bed without using bedrails?: A Lot Help needed moving to and from a bed to a chair (including a wheelchair)?: A Little Help needed standing up from a chair using your arms (e.g., wheelchair or bedside chair)?: A Little Help needed to walk in hospital room?: A Lot Help needed climbing 3-5 steps with a railing? : Total 6 Click Score: 13    End of Session Equipment Utilized During Treatment: Gait belt Activity Tolerance: Patient tolerated treatment well Patient left: in chair;with call bell/phone within reach;with chair alarm set Nurse Communication: Mobility status;Need for lift equipment (stedy) PT Visit Diagnosis: Muscle weakness (generalized) (M62.81);History of falling (Z91.81);Unsteadiness on feet (R26.81)     Time: 1337-1400 PT Time Calculation (min) (ACUTE ONLY): 23 min  Charges:  $Therapeutic Activity: 23-37 mins                     Windell Norfolk, DPT, PN1   Supplemental Physical Therapist [place]    Pager [phone] Acute Rehab Office [phone]

## 2020-05-16 NOTE — Social Work (Signed)
CSW uploaded information to Pass[employer]. Waiting on Passr number.   Jimmy Picket, Theresia Majors, [place] Clinical Social Worker [phone]

## 2020-05-16 NOTE — Progress Notes (Signed)
PROGRESS NOTE  Trevor Cook VQM:[id number] DOB: 12-15-1945 DOA: 05/12/2020 PCP: Aida Puffer, MD  Brief History   Trevor Cook is a 75 y.o. male with medical history significant of EtOH related chronic encephalitis, HTN, hypothyroidism, bipolar disease.  Patient at lives home with his son and his son's wife. Patient has a history of Warnicke's encephalopathy. He also has a history of bipolar disorder. Patient son reports that the patient has not had alcohol since 1998. He reports at baseline, patient does have some amount of dementia but is up and ambulatory. He goes out to an out building to do hobbies and projects. Patient does not drive a car. Patient's son reports at the end of the summer, the neurologist suspected he had had a stroke due to some increased imbalance and occasional speech changes. Patient's son and his son's wife report they noted a change over about the past 3 days. Patient seem like he might be sick. He seemed to be spending more time in his room and he seemed more unsteady they have not identified any fevers. Patient has been eating fairly well. He has not had any vomiting or diarrhea. He did have a recent medication change. They are tapering off Depakote and starting Lamictal. Patient is chronically on lithium. Change was made by the neurologist for increasing tremor. Today, the patient's son found the patient face down by his recliner in his room. He had fallen. His son did not see him fall. He tried to help him up but he was extremely weak and unsteady. This was atypical. He was sent to the emergency department by EMS for further evaluation. Patient cannot add anything to the history patient mumbles some unintelligible responses and makes a few intelligible words but no coherent history. .  ED Course: T 100.5  140/57  HR 69  RR 14. EDP exam notable for some swelling in the left periorbital aarea and conjunctiva, tremor. Lab - Covid NEGATIVE, Troponin #1  20, BNP 83, Li 0.54, TSH 1.284, WBC 9.6, Hgb 12.3, U/A negatvie. CT head no acute injury. CT C-spine no acute fx. CXR NAD. TRH called to admit for encephalopathy.  The patient is being roomed in the ED pending availability of a bed upstairs. Neurology has been consulted. They are checking a lithium level and an MRI brain.  Neurology was consulted to determine if further investigation needed to be performed to elucidate the causes of the patient's recent decline. It seems that it did not. Neurology has signed off. Psychiatry has been consulted at the recommendation of neurology to fine tune the patient's psychiatric medications. I have held any sedating medications.           Psychiatry has evaluated the patient. They have put him back on Risperdal at 50% of the dose (0.5 mg PO BID), xanax continued at 1 mg po bid prn, and lamictal will be resumed once the level has resulted.                  SNF has evaluated the patient and has recommended SNF placement. It is pending.                           Consultants  . Neurology . Psychiatry  Procedures  . None  Antibiotics   Anti-infectives (From admission, onward)   None     Subjective  The patient is awake and alert. No new complaints.  Objective   Vitals:  Vitals:  05/16/20 0546 05/16/20 1207  BP: 128/66 128/76  Pulse: 79 74  Resp: 18 18  Temp: 98.9 F (37.2 C) 99 F (37.2 C)  SpO2: 96% 98%   Exam:  Constitutional:  . The patient is awake and alert. Disoriented. No acute distress. Respiratory:  . No increased work of breathing. . No wheezes, rales, or rhonchi . No tactile fremitus Cardiovascular:  . Regular rate and rhythm . No murmurs, ectopy, or gallups. . No lateral PMI. No thrills. Abdomen:  . Abdomen is soft, non-tender, non-distended . No hernias, masses, or organomegaly . Normoactive bowel sounds.  Musculoskeletal:  . No cyanosis, clubbing, or edema Skin:  . No rashes, lesions, ulcers . palpation of skin:  no induration or nodules Neurologic:  . Pt si moving all extremities Psychiatric: Disoriented. Unable to evaluate as the patient is unable to cooperate with exam.  I have personally reviewed the following:   Today's Data  . Dispensing optician  . Blood cultures x 2 pending  Imaging  . CT head . CT C-spine  Cardiology Data  . EKG  Scheduled Meds: . aspirin EC  81 mg Oral Daily  . benztropine  0.5 mg Oral BID  . enoxaparin (LOVENOX) injection  40 mg Subcutaneous Q24H  . levothyroxine  50 mcg Oral QAC supper  . lithium carbonate  900 mg Oral QPC supper  . multivitamin with minerals  1 tablet Oral Daily  . risperiDONE  0.5 mg Oral BID  . [START ON 05/21/2020] thiamine injection  100 mg Intravenous Daily   Continuous Infusions: . sodium chloride 50 mL/hr at 05/14/20 2122  . 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 Terrace Arabia (neurology) office note and tremor. He is different from his baseline. Labs seem unremarkable but he does have a fever with no obvious source. Is transitioning to lamictal. MR! 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 Son reports that there has been a significant change in gait and balance and short term memory. Also more mania and paranoia over past weeks. The patient has been admitted to a med/surg bed. Sedating medications have been held. I have consulted neurology to help determine if the changes that the family is seeing are reversible or due to progression of the patient's baseline encephalopathy. Psychiatry has been consulted at the recommendation of neurology to fine tune the patient's psychiatric medications. I  had held any sedating medications, but the patient was evaluated by psychiatry today in order to gradually restart his psychiatric meds without becoming over-sedated.  Debility: PT/OT to evaluate and treat. The patient's son states that they are no longer able to care for him at home. TOC consulted for placement. He will likely require a memory care facility vs SNF.  HTN: Continue home meds  Hypothyroidism: TSH in normal range. Continue levo thyroxine as at home.  Dementia: Noted. Medications have been fine tuned by psychiatry.  I have seen and examined this patient myself. I have spent 34 minutes in his evaluation and care.  DVT prophylaxis: lovenox  Code Status: DNR/DNI  Family Communication: Spoke with son at length. All questions answered to the best  of my ability. Disposition Plan: TBD - TOC    Kenzley Ke, DO [facility] Direct contact: see [email or web address]  7PM-7AM contact night coverage as above 05/16/2020, 3:55 PM  LOS: 0 days

## 2020-05-16 NOTE — TOC Progression Note (Signed)
Transition of Care [facility]) - Progression Note    Patient Details  Name: Trevor Cook MRN: [id number] Date of Birth: [birth year]  Transition of Care [facility] CM/SW Contact  Jimmy Picket, [place] Phone Number: 05/16/2020, 12:12 PM  Clinical Narrative:     PTs son Jogn chose [facility]. CSW was informed that [facility]  Has paused admissions due to a covid outbreak. Pts son will have to pick a bed from the other 2 choices.   Expected Discharge Plan: Skilled Nursing Facility Barriers to Discharge: Continued Medical Work up  Expected Discharge Plan and Services Expected Discharge Plan: Skilled Nursing Facility   Discharge Planning Services: CM Consult   Living arrangements for the past 2 months: Single Family Home                                       Social Determinants of Health (SDOH) Interventions    Readmission Risk Interventions No flowsheet data found.  Jimmy Picket, Theresia Majors, [place] Clinical Social Worker [phone]

## 2020-05-17 DIAGNOSIS — G2119 Other drug induced secondary parkinsonism: Secondary | ICD-10-CM | POA: Diagnosis not present

## 2020-05-17 DIAGNOSIS — W19XXXA Unspecified fall, initial encounter: Secondary | ICD-10-CM

## 2020-05-17 DIAGNOSIS — E039 Hypothyroidism, unspecified: Secondary | ICD-10-CM | POA: Diagnosis not present

## 2020-05-17 LAB — LAMOTRIGINE LEVEL: Lamotrigine Lvl: 1.1 ug/mL — ABNORMAL LOW (ref 2.0–20.0)

## 2020-05-17 MED ORDER — THIAMINE HCL 100 MG PO TABS: 100.0000 mg | ORAL_TABLET | Freq: Every day | ORAL | Status: AC

## 2020-05-17 MED ORDER — BENZTROPINE MESYLATE 0.5 MG PO TABS: 0.5000 mg | ORAL_TABLET | Freq: Two times a day (BID) | ORAL | Status: DC

## 2020-05-17 MED ORDER — RISPERIDONE 0.5 MG PO TABS: 0.5000 mg | ORAL_TABLET | Freq: Two times a day (BID) | ORAL | Status: DC

## 2020-05-17 MED ORDER — ALPRAZOLAM 0.5 MG PO TABS: 0.5000 mg | ORAL_TABLET | Freq: Two times a day (BID) | ORAL | 0 refills | Status: DC | PRN

## 2020-05-17 NOTE — Discharge Summary (Signed)
Physician Discharge Summary  Trevor Cook ATF:[id number] DOB: 04/04/1946 DOA: 05/12/2020  PCP: Aida Puffer, MD  Admit date: 05/12/2020 Discharge date: 05/17/2020  Admitted From: home Discharge disposition: SNF   Recommendations for Outpatient Follow-Up:   1. lamictal level pending-- Resume lamictal if able at that time 2. BMP 1 week   Discharge Diagnosis:   Active Problems:   Tremor   Essential hypertension   Hypothyroidism   Korsakoff syndrome (HCC)   Acute metabolic encephalopathy   Encephalopathy   Toxic metabolic encephalopathy   Drug-induced parkinsonism (HCC)   Fall    Discharge Condition: Improved.  Diet recommendation:  Regular.  Wound care: None.  Code status: DNR   History of Present Illness:   Trevor Cook is a 75 y.o. male with medical history significant of EtOH related chronic encephalitis, HTN, hypothyroidism, bipolar disease.  Patient at lives home with his son and his son's wife. Patient has a history of Warnicke's encephalopathy. He also has a history of bipolar disorder. Patient son reports that the patient has not had alcohol since 1998. He reports at baseline, patient does have some amount of dementia but is up and ambulatory. He goes out to an out building to do hobbies and projects. Patient does not drive a car. Patient's son reports at the end of the summer, the neurologist suspected he had had a stroke due to some increased imbalance and occasional speech changes. Patient's son and his son's wife report they noted a change over about the past 3 days. Patient seem like he might be sick. He seemed to be spending more time in his room and he seemed more unsteady they have not identified any fevers. Patient has been eating fairly well. He has not had any vomiting or diarrhea. He did have a recent medication change. They are tapering off Depakote and starting Lamictal. Patient is chronically on lithium. Change was made by the  neurologist for increasing tremor. Today, the patient's son found the patient face down by his recliner in his room. He had fallen. His son did not see him fall. He tried to help him up but he was extremely weak and unsteady. This was atypical. He was sent to the emergency department by EMS for further evaluation. Patient cannot add anything to the history patient mumbles some unintelligible responses and makes a few intelligible words but no coherent history. Marland Kitchen   Hospital Course by Problem:   Weakness with fall: -multifactorial -PT- SNF  Chronic encephalitis - EtOH related chronic encephalitis peer Dr. Terrace Arabia (neurology) office note and tremor.  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 -psych consult: Patient with history of alcoholic induced chronic encephalitis, currently stable on previous psychiatric medications that have been held due to sedation, and altered mental state upon admission. At this time will resume Risperdal at 50% dose.  -Will start risperdal 0.5mg  po BID  - will continue xanax PO BID prn -WIll resume Lamictal once, Level has resulted.   HTN:  -Continue home meds  Hypothyroidism:  -TSH in normal range. Continue levo thyroxine as at home.  Hyponatremia- D/C 1/2 NS    Medical Consultants:    Psych neuro  Discharge Exam:   Vitals:   05/17/20 0626 05/17/20 1222  BP: 113/70 135/81  Pulse: 73 80  Resp: 18 16  Temp: 98.7 F (37.1 C) 98.1 F (36.7 C)  SpO2: 97% 98%   Vitals:   05/16/20 1703 05/17/20 0122 05/17/20 0626 05/17/20 1222  BP: 131/73  136/79 113/70 135/81  Pulse: 74 78 73 80  Resp: 17 18 18 16   Temp: 98.5 F (36.9 C) 99.3 F (37.4 C) 98.7 F (37.1 C) 98.1 F (36.7 C)  TempSrc: Oral Oral Oral Oral  SpO2: 98% 100% 97% 98%       The results of significant diagnostics from this hospitalization (including imaging, microbiology, ancillary and laboratory) are listed below for reference.     Procedures and  Diagnostic Studies:   CT Head Wo Contrast  Result Date: 05/12/2020 CLINICAL DATA:  Head trauma, minor. Neck trauma. Additional history obtained from electronic MEDICAL RECORD NUMBERUnwitnessed fall today. EXAM: CT HEAD WITHOUT CONTRAST CT CERVICAL SPINE WITHOUT CONTRAST TECHNIQUE: Multidetector CT imaging of the head and cervical spine was performed following the standard protocol without intravenous contrast. Multiplanar CT image reconstructions of the cervical spine were also generated. COMPARISON:  Brain MRI 04/24/2020.  Head CT 04/20/2010. FINDINGS: CT HEAD FINDINGS Brain: Mild cerebral and cerebellar atrophy. Moderate ill-defined hypoattenuation within the cerebral white matter is nonspecific, but compatible with chronic small vessel ischemic disease. 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 There is no acute intracranial hemorrhage. No demarcated cortical infarct. No extra-axial fluid collection. No evidence of intracranial mass. No midline shift. Vascular: No hyperdense vessel.  Atherosclerotic calcifications. Skull: Normal. Negative for fracture or focal lesion. Sinuses/Orbits: Visualized orbits show no acute finding. Mild paranasal sinus mucosal thickening, most notably within the left frontal, bilateral ethmoid and bilateral maxillary sinuses. Small foci of polypoid mucosal thickening versus mucous retention cysts within the bilateral maxillary sinuses. CT CERVICAL SPINE FINDINGS Alignment: Cervical levocurvature, possibly positional. Mild nonspecific reversal of the expected cervical lordosis. 2 mm C2-C3 grade 1 anterolisthesis. Trace C7-T1 grade 1 anterolisthesis. Skull base and vertebrae: The basion-dental and atlanto-dental intervals are maintained.No evidence of acute fracture to the cervical spine. Mild age-indeterminate anterior wedge compression deformity of the T1 vertebral body (series 6, image 29). Soft tissues and spinal canal: No prevertebral fluid or swelling.  No visible canal hematoma. Disc levels: Cervical spondylosis with multilevel disc space narrowing, disc bulges, uncovertebral hypertrophy and facet arthrosis. Disc space narrowing is moderate/advanced at C4-C5, C5-C6 and C6-C7. facet joint ankylosis on the right and possibly on the left at C2-C3. Upper chest: No consolidation within the imaged lung apices. No visible pneumothorax. IMPRESSION: CT head: 1. No evidence of acute intracranial abnormality. 2. Mild generalized atrophy of the brain with moderate chronic small vessel ischemic disease. 3. 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 4. Paranasal sinus disease as described. CT cervical spine: 1. Mild age-indeterminate anterior wedge compression deformity of the T1 vertebral body. 2. No evidence of acute fracture to the cervical spine. 3. Nonspecific reversal of the expected cervical lordosis. 4. Cervical levocurvature, possibly positional. 5. Mild C2-C3 and C7-T1 grade 1 anterolisthesis. 6. Cervical spondylosis as described. 7. 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 Electronically Signed   By: Jackey LogeKyle  Golden DO   On: 05/12/2020 18:05   CT Cervical Spine Wo Contrast  Result Date: 05/12/2020 CLINICAL DATA:  Head trauma, minor. Neck trauma. Additional history obtained from electronic MEDICAL RECORD NUMBERUnwitnessed fall today. EXAM: CT HEAD WITHOUT CONTRAST CT CERVICAL SPINE WITHOUT CONTRAST TECHNIQUE: Multidetector CT imaging of the head and cervical spine was performed following the standard protocol without intravenous contrast. Multiplanar CT image reconstructions of the cervical spine were also generated. COMPARISON:  Brain MRI 04/24/2020.  Head CT 04/20/2010. FINDINGS: CT HEAD FINDINGS Brain: Mild cerebral and cerebellar atrophy. Moderate ill-defined hypoattenuation within  the cerebral white matter is nonspecific, but compatible with chronic small vessel ischemic disease. Redemonstrated small chronic infarct within the left cerebellar  hemisphere. There is no acute intracranial hemorrhage. No demarcated cortical infarct. No extra-axial fluid collection. No evidence of intracranial mass. No midline shift. Vascular: No hyperdense vessel.  Atherosclerotic calcifications. Skull: Normal. Negative for fracture or focal lesion. Sinuses/Orbits: Visualized orbits show no acute finding. Mild paranasal sinus mucosal thickening, most notably within the left frontal, bilateral ethmoid and bilateral maxillary sinuses. Small foci of polypoid mucosal thickening versus mucous retention cysts within the bilateral maxillary sinuses. CT CERVICAL SPINE FINDINGS Alignment: Cervical levocurvature, possibly positional. Mild nonspecific reversal of the expected cervical lordosis. 2 mm C2-C3 grade 1 anterolisthesis. Trace C7-T1 grade 1 anterolisthesis. Skull base and vertebrae: The basion-dental and atlanto-dental intervals are maintained.No evidence of acute fracture to the cervical spine. Mild age-indeterminate anterior wedge compression deformity of the T1 vertebral body (series 6, image 29). Soft tissues and spinal canal: No prevertebral fluid or swelling. No visible canal hematoma. Disc levels: Cervical spondylosis with multilevel disc space narrowing, disc bulges, uncovertebral hypertrophy and facet arthrosis. Disc space narrowing is moderate/advanced at C4-C5, C5-C6 and C6-C7. facet joint ankylosis on the right and possibly on the left at C2-C3. Upper chest: No consolidation within the imaged lung apices. No visible pneumothorax. IMPRESSION: CT head: 1. No evidence of acute intracranial abnormality. 2. Mild generalized atrophy of the brain with moderate chronic small vessel ischemic disease. 3. 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 4. Paranasal sinus disease as described. CT cervical spine: 1. Mild age-indeterminate anterior wedge compression deformity of the T1 vertebral body. 2. No evidence of acute fracture to the cervical  spine. 3. Nonspecific reversal of the expected cervical lordosis. 4. Cervical levocurvature, possibly positional. 5. Mild C2-C3 and C7-T1 grade 1 anterolisthesis. 6. Cervical spondylosis as described. 7. 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 Electronically Signed   By: Jackey Loge DO   On: 05/12/2020 18:05   MR BRAIN WO CONTRAST  Result Date: 05/13/2020 CLINICAL DATA:  Increased unsteadiness with fall EXAM: MRI HEAD WITHOUT CONTRAST TECHNIQUE: Multiplanar, multiecho pulse sequences of the brain and surrounding structures were obtained without intravenous contrast. COMPARISON:  04/24/2020 FINDINGS: Motion artifact is present. Brain: There is no acute infarction or intracranial hemorrhage. There is no intracranial mass, mass effect, or edema. There is no hydrocephalus or extra-axial fluid collection. Patchy and confluent areas of T2 hyperintensity in the supratentorial white matter are nonspecific but probably reflect stable microvascular ischemic changes. Small chronic left cerebellar infarct. Prominence of the ventricles and sulci reflecting stable parenchymal volume loss. Vascular: Major vessel flow voids at the skull base are preserved. Skull and upper cervical spine: Normal marrow signal is preserved. Sinuses/Orbits: Mild mucosal thickening. Bilateral lens replacements. Other: Sella is unremarkable.  Mastoid air cells are clear. IMPRESSION: No evidence of recent infarction, hemorrhage, or mass. Stable findings of parenchymal volume loss chronic microvascular ischemic changes, and small left cerebellar infarct. Electronically Signed   By: Guadlupe Spanish M.D.   On: 05/13/2020 17:10   DG Chest Port 1 View  Result Date: 05/12/2020 CLINICAL DATA:  Dementia worsening over the last 4-6 months. EXAM: PORTABLE CHEST 1 VIEW COMPARISON:  04/20/2010 FINDINGS: Cardiac silhouette is normal in size. No mediastinal or hilar masses. Clear lungs.  No pleural effusion or pneumothorax. Skeletal structures are grossly intact.  IMPRESSION: No acute cardiopulmonary disease. Electronically Signed   By: Amie Portland M.D.   On: 05/12/2020 17:18  Labs:   Basic Metabolic Panel: Recent Labs  Lab 05/12/20 1632 05/12/20 1711 05/14/20 0408  NA 138 139 133*  K 4.4 4.5 4.5  CL 102  --  102  CO2 27  --  23  GLUCOSE 115*  --  118*  BUN 19  --  20  CREATININE 0.91  --  0.96  CALCIUM 9.5  --  8.8*  MG 2.1  --   --   PHOS 4.0  --   --    GFR CrCl cannot be calculated (Unknown ideal weight.). Liver Function Tests: Recent Labs  Lab 05/12/20 1632 05/14/20 0408  AST 51* 48*  ALT 32 29  ALKPHOS 51 39  BILITOT 0.5 1.3*  PROT 6.5 5.7*  ALBUMIN 3.7 3.0*   Recent Labs  Lab 05/12/20 1632  LIPASE 20   Recent Labs  Lab 05/12/20 1633  AMMONIA 23   Coagulation profile Recent Labs  Lab 05/12/20 1632  INR 1.1    CBC: Recent Labs  Lab 05/12/20 1632 05/12/20 1711 05/14/20 0408  WBC 9.6  --  6.5  NEUTROABS 7.1  --  4.2  HGB 12.3* 13.3 12.2*  HCT 39.1 39.0 35.8*  MCV 100.3*  --  95.5  PLT 229  --  197   Cardiac Enzymes: No results for input(s): CKTOTAL, CKMB, CKMBINDEX, TROPONINI in the last 168 hours. BNP: Invalid input(s): POCBNP CBG: No results for input(s): GLUCAP in the last 168 hours. D-Dimer No results for input(s): DDIMER in the last 72 hours. Hgb A1c No results for input(s): HGBA1C in the last 72 hours. Lipid Profile No results for input(s): CHOL, HDL, LDLCALC, TRIG, CHOLHDL, LDLDIRECT in the last 72 hours. Thyroid function studies No results for input(s): TSH, T4TOTAL, T3FREE, THYROIDAB in the last 72 hours.  Invalid input(s): FREET3 Anemia work up No results for input(s): VITAMINB12, FOLATE, FERRITIN, TIBC, IRON, RETICCTPCT in the last 72 hours. Microbiology Recent Results (from the past 240 hour(s))  Resp Panel by RT-PCR (Flu A&B, Covid) Nasopharyngeal Swab     Status: None   Collection Time: 05/12/20  8:21 PM   Specimen: Nasopharyngeal Swab; Nasopharyngeal(NP) swabs in  vial transport medium  Result Value Ref Range Status   SARS Coronavirus 2 by RT PCR NEGATIVE NEGATIVE Final    Comment: (NOTE) SARS-CoV-2 target nucleic acids are NOT DETECTED.  The SARS-CoV-2 RNA is generally detectable in upper respiratory specimens during the acute phase of infection. The lowest concentration of SARS-CoV-2 viral copies this assay can detect is 138 copies/mL. A negative result does not preclude SARS-Cov-2 infection and should not be used as the sole basis for treatment or other patient management decisions. A negative result may occur with  improper specimen collection/handling, submission of specimen other than nasopharyngeal swab, presence of viral mutation(s) within the areas targeted by this assay, and inadequate number of viral copies(<138 copies/mL). A negative result must be combined with clinical observations, patient history, and epidemiological information. The expected result is Negative.  Fact Sheet for Patients:  BloggerCourse.com  Fact Sheet for Healthcare Providers:  SeriousBroker.it  This test is no t yet approved or cleared by the [place] FDA and  has been authorized for detection and/or diagnosis of SARS-CoV-2 by FDA under an Emergency Use Authorization (EUA). This EUA will remain  in effect (meaning this test can be used) for the duration of the COVID-19 declaration under Section 564(b)(1) of the Act, 21 U.S.C.section 360bbb-3(b)(1), unless the authorization is terminated  or revoked sooner.  Influenza A by PCR NEGATIVE NEGATIVE Final   Influenza B by PCR NEGATIVE NEGATIVE Final    Comment: (NOTE) The Xpert Xpress SARS-CoV-2/FLU/RSV plus assay is intended as an aid in the diagnosis of influenza from Nasopharyngeal swab specimens and should not be used as a sole basis for treatment. Nasal washings and aspirates are unacceptable for Xpert Xpress SARS-CoV-2/FLU/RSV testing.  Fact  Sheet for Patients: BloggerCourse.com  Fact Sheet for Healthcare Providers: SeriousBroker.it  This test is not yet approved or cleared by the [place] FDA and has been authorized for detection and/or diagnosis of SARS-CoV-2 by FDA under an Emergency Use Authorization (EUA). This EUA will remain in effect (meaning this test can be used) for the duration of the COVID-19 declaration under Section 564(b)(1) of the Act, 21 U.S.C. section 360bbb-3(b)(1), unless the authorization is terminated or revoked.  Performed at [facility], [address], [place], [place] [address]   Culture, blood (routine x 2)     Status: None (Preliminary result)   Collection Time: 05/13/20  5:58 PM   Specimen: BLOOD  Result Value Ref Range Status   Specimen Description BLOOD LEFT ANTECUBITAL  Final   Special Requests   Final    BOTTLES DRAWN AEROBIC AND ANAEROBIC Blood Culture adequate volume   Culture   Final    NO GROWTH 4 DAYS Performed at [facility], [address], [place], [place] [address]    Report Status PENDING  Incomplete  Culture, blood (routine x 2)     Status: None (Preliminary result)   Collection Time: 05/13/20  6:05 PM   Specimen: BLOOD  Result Value Ref Range Status   Specimen Description BLOOD RIGHT ANTECUBITAL  Final   Special Requests   Final    BOTTLES DRAWN AEROBIC ONLY Blood Culture adequate volume   Culture   Final    NO GROWTH 4 DAYS Performed at [facility], [address] [place], [place] [address]    Report Status PENDING  Incomplete  SARS CORONAVIRUS 2 (TAT 6-24 HRS) Nasopharyngeal Nasopharyngeal Swab     Status: None   Collection Time: 05/15/20  9:56 PM   Specimen: Nasopharyngeal Swab  Result Value Ref Range Status   SARS Coronavirus 2 NEGATIVE NEGATIVE Final    Comment: (NOTE) SARS-CoV-2 target nucleic acids are NOT DETECTED.  The SARS-CoV-2 RNA is generally detectable in upper and  lower respiratory specimens during the acute phase of infection. Negative results do not preclude SARS-CoV-2 infection, do not rule out co-infections with other pathogens, and should not be used as the sole basis for treatment or other patient management decisions. Negative results must be combined with clinical observations, patient history, and epidemiological information. The expected result is Negative.  Fact Sheet for Patients: HairSlick.no  Fact Sheet for Healthcare Providers: quierodirigir.com  This test is not yet approved or cleared by the [place] FDA and  has been authorized for detection and/or diagnosis of SARS-CoV-2 by FDA under an Emergency Use Authorization (EUA). This EUA will remain  in effect (meaning this test can be used) for the duration of the COVID-19 declaration under Se ction 564(b)(1) of the Act, 21 U.S.C. section 360bbb-3(b)(1), unless the authorization is terminated or revoked sooner.  Performed at [facility], [address] [place], [place] [address]      Discharge Instructions:   Discharge Instructions    Diet general   Complete by: As directed    Increase activity slowly  Complete by: As directed      Allergies as of 05/17/2020   No Known Allergies     Medication List    STOP taking these medications   divalproex 500 MG DR tablet Commonly known as: DEPAKOTE   lamoTRIgine 100 MG tablet Commonly known as: LaMICtal   lamoTRIgine 25 MG tablet Commonly known as: LAMICTAL     TAKE these medications   ALPRAZolam 0.5 MG tablet Commonly known as: XANAX Take 1 tablet (0.5 mg total) by mouth 2 (two) times daily as needed for anxiety. What changed:   medication strength  how much to take  when to take this   benztropine 0.5 MG tablet Commonly known as: COGENTIN Take 1 tablet (0.5 mg total) by mouth 2 (two) times daily. What changed:   medication  strength  how much to take  when to take this   levothyroxine 50 MCG tablet Commonly known as: SYNTHROID Take 50 mcg by mouth daily after supper.   lithium carbonate 300 MG capsule Take 900 mg by mouth daily after supper.   multivitamin with minerals Tabs tablet Take 1 tablet by mouth daily.   risperiDONE 0.5 MG tablet Commonly known as: RISPERDAL Take 1 tablet (0.5 mg total) by mouth 2 (two) times daily. What changed:   medication strength  how much to take  when to take this   thiamine 100 MG tablet Take 1 tablet (100 mg total) by mouth daily.       Contact information for after-discharge care    Destination    [facility] Preferred SNF .   Service: Skilled Nursing Contact information: [address] [place] [id number] [phone]                   Time coordinating discharge: 35 min  Signed:  Joseph ArtJessica U Leza Apsey DO  [facility] 05/17/2020, 12:57 PM

## 2020-05-17 NOTE — TOC Progression Note (Signed)
Transition of Care [facility]) - Progression Note    Patient Details  Name: Trevor Cook MRN: [id number] Date of Birth: 1945/05/08  Transition of Care [facility]) CM/SW Contact  Levada Schilling Phone Number: 05/17/2020, 1:05 PM  Clinical Narrative:    CSW received phone call from [facility] that they can accept pt today if medically stable.  CSW updated physician and RN.  TOC will continue to assist with disposition planning.   Expected Discharge Plan: Skilled Nursing Facility Barriers to Discharge: Continued Medical Work up  Expected Discharge Plan and Services Expected Discharge Plan: Skilled Nursing Facility   Discharge Planning Services: CM Consult   Living arrangements for the past 2 months: Single Family Home Expected Discharge Date: 05/17/20                                     Social Determinants of Health (SDOH) Interventions    Readmission Risk Interventions No flowsheet data found.

## 2020-05-17 NOTE — TOC Transition Note (Signed)
Transition of Care [facility]) - CM/SW Discharge Note   Patient Details  Name: Trevor Cook MRN: [id number] Date of Birth: 18-Dec-1945  Transition of Care [facility]) CM/SW Contact:  Levada Schilling Phone Number: 05/17/2020, 1:30 PM   Clinical Narrative:    Patient will Discharge To: [facility] Anticipated DC Date: 05/17/20 Family Notified:yes, son Petro Talent, [phone] Transport By: Sharin Mons   Per MD patient ready for DC to [facility]  . RN, patient, patient's family, and facility notified of DC. Assessment, Fl2/Pasrr, and Discharge Summary sent to facility. RN given number for report [phone], [phone]). DC packet on chart. Ambulance transport requested for patient.   CSW signing off.  Budd Palmer LCSWA [phone]     Final next level of care: Skilled Nursing Facility Barriers to Discharge: No Barriers Identified   Patient Goals and CMS Choice Patient states their goals for this hospitalization and ongoing recovery are:: to gain strenght CMS Medicare.gov Compare Post Acute Care list provided to:: Patient Represenative (must comment) Choice offered to / list presented to : Adult Children  Discharge Placement              Patient chooses bed at: [facility] Patient to be transferred to facility by: [facility] Name of family member notified: Genevieve Norlander Patient and family notified of of transfer: 05/17/20  Discharge Plan and Services   Discharge Planning Services: CM Consult                                 Social Determinants of Health (SDOH) Interventions     Readmission Risk Interventions No flowsheet data found.

## 2020-05-17 NOTE — Progress Notes (Signed)
Pt vital signs and pt is stable [facility] arrived transported pt to [facility]

## 2020-05-17 NOTE — Progress Notes (Incomplete)
Pt being d/c to [facility] by [facility]. Report called to nurse at [facility]. Pt has all belongings and d/c papers.

## 2020-05-17 NOTE — Progress Notes (Signed)
Progress Note    Trevor Cook  IWL:[id number] DOB: 07/05/1945  DOA: 05/12/2020 PCP: Aida Puffer, MD    Brief Narrative:    Medical records reviewed and are as summarized below:  Trevor Cook is an 75 y.o. male with medical history significant ofEtOH related chronic encephalitis, HTN, hypothyroidism, bipolar disease.Patientatlives home with his son and his son's wife. Patient has a history of Warnicke's encephalopathy. He also has a history of bipolar disorder. Patient son reports that the patient has not had alcohol since 1998. He reports at baseline, patient does have some amount of dementia but is up and ambulatory. the patient's son found the patient face down by his recliner in his room. He had fallen. His son did not see him fall. He tried to help him up but he was extremely weak and unsteady.  Neurology was consulted to determine if further investigation needed to be performed to elucidate the causes of the patient's recent decline. It seems that it did not. Neurology has signed off. Psychiatry has been consulted at the recommendation of neurology to fine tune the patient's psychiatric medications.  They have put him back on Risperdal at 50% of the dose (0.5 mg PO BID), xanax continued at 1 mg po bid prn, and lamictal will be resumed once the level has resulted.                 PT has evaluated the patient and has recommended SNF placement.   Assessment/Plan:   Active Problems:   Tremor   Essential hypertension   Hypothyroidism   Korsakoff syndrome (HCC)   Acute metabolic encephalopathy   Encephalopathy   Toxic metabolic encephalopathy   Drug-induced parkinsonism (HCC)   Fall   Weakness with fall: -multifactorial -PT- SNF  Chronic encephalitis - EtOH related chronic encephalitis peer Dr. Terrace Arabia (neurology) office note and tremor.  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 -psych consult: Patient with history of alcoholic induced chronic  encephalitis, currently stable on previous psychiatric medications that have been held due to sedation, and altered mental state upon admission. At this time will resume Risperdal at 50% dose.  -Will start risperdal 0.5mg  po BID  - will continue xanax 1mg  po BID prn -WIll resume Lamictal once, Level has resulted.   HTN:  -Continue home meds  Hypothyroidism:  -TSH in normal range. Continue levo thyroxine as at home.  Hyponatremia- D/C 1/2 NS    Family Communication/Anticipated D/C date and plan/Code Status   DVT prophylaxis: Lovenox ordered. Code Status: DNR Disposition Plan: Status is: Inpatient  Remains inpatient appropriate because:Inpatient level of care appropriate due to severity of illness   Dispo: The patient is from: Home              Anticipated d/c is to: SNF              Anticipated d/c date is: 1 day              Patient currently number         Medical Consultants:    Neurology  Psychiatry     Subjective:   Speech difficult to understand  Objective:    Vitals:   05/16/20 1207 05/16/20 1703 05/17/20 0122 05/17/20 0626  BP: 128/76 131/73 136/79 113/70  Pulse: 74 74 78 73  Resp: 18 17 18 18   Temp: 99 F (37.2 C) 98.5 F (36.9 C) 99.3 F (37.4 C) 98.7 F (37.1 C)  TempSrc: Oral Oral  Oral Oral  SpO2: 98% 98% 100% 97%    Intake/Output Summary (Last 24 hours) at 05/17/2020 1127 Last data filed at 05/17/2020 0900 Gross per 24 hour  Intake 557 ml  Output 500 ml  Net 57 ml   There were no vitals filed for this visit.  Exam:  General: Appearance:     Overweight male in no acute distress     Lungs:     respirations unlabored  Heart:    Normal heart rate. Normal rhythm. No murmurs, rubs, or gallops.   MS:   All extremities are intact.   Neurologic:   Awake, speech difficult to understand    Data Reviewed:   I have personally reviewed following labs and imaging studies:  Labs: Labs show the following:   Basic  Metabolic Panel: Recent Labs  Lab 05/12/20 1632 05/12/20 1711 05/14/20 0408  NA 138 139 133*  K 4.4 4.5 4.5  CL 102  --  102  CO2 27  --  23  GLUCOSE 115*  --  118*  BUN 19  --  20  CREATININE 0.91  --  0.96  CALCIUM 9.5  --  8.8*  MG 2.1  --   --   PHOS 4.0  --   --    GFR CrCl cannot be calculated (Unknown ideal weight.). Liver Function Tests: Recent Labs  Lab 05/12/20 1632 05/14/20 0408  AST 51* 48*  ALT 32 29  ALKPHOS 51 39  BILITOT 0.5 1.3*  PROT 6.5 5.7*  ALBUMIN 3.7 3.0*   Recent Labs  Lab 05/12/20 1632  LIPASE 20   Recent Labs  Lab 05/12/20 1633  AMMONIA 23   Coagulation profile Recent Labs  Lab 05/12/20 1632  INR 1.1    CBC: Recent Labs  Lab 05/12/20 1632 05/12/20 1711 05/14/20 0408  WBC 9.6  --  6.5  NEUTROABS 7.1  --  4.2  HGB 12.3* 13.3 12.2*  HCT 39.1 39.0 35.8*  MCV 100.3*  --  95.5  PLT 229  --  197   Cardiac Enzymes: No results for input(s): CKTOTAL, CKMB, CKMBINDEX, TROPONINI in the last 168 hours. BNP (last 3 results) No results for input(s): PROBNP in the last 8760 hours. CBG: No results for input(s): GLUCAP in the last 168 hours. D-Dimer: No results for input(s): DDIMER in the last 72 hours. Hgb A1c: No results for input(s): HGBA1C in the last 72 hours. Lipid Profile: No results for input(s): CHOL, HDL, LDLCALC, TRIG, CHOLHDL, LDLDIRECT in the last 72 hours. Thyroid function studies: No results for input(s): TSH, T4TOTAL, T3FREE, THYROIDAB in the last 72 hours.  Invalid input(s): FREET3 Anemia work up: No results for input(s): VITAMINB12, FOLATE, FERRITIN, TIBC, IRON, RETICCTPCT in the last 72 hours. Sepsis Labs: Recent Labs  Lab 05/12/20 1632 05/12/20 2110 05/14/20 0408  WBC 9.6  --  6.5  LATICACIDVEN 1.3 0.9  --     Microbiology Recent Results (from the past 240 hour(s))  Resp Panel by RT-PCR (Flu A&B, Covid) Nasopharyngeal Swab     Status: None   Collection Time: 05/12/20  8:21 PM   Specimen:  Nasopharyngeal Swab; Nasopharyngeal(NP) swabs in vial transport medium  Result Value Ref Range Status   SARS Coronavirus 2 by RT PCR NEGATIVE NEGATIVE Final    Comment: (NOTE) SARS-CoV-2 target nucleic acids are NOT DETECTED.  The SARS-CoV-2 RNA is generally detectable in upper respiratory specimens during the acute phase of infection. The lowest concentration of SARS-CoV-2 viral copies this assay can  detect is 138 copies/mL. A negative result does not preclude SARS-Cov-2 infection and should not be used as the sole basis for treatment or other patient management decisions. A negative result may occur with  improper specimen collection/handling, submission of specimen other than nasopharyngeal swab, presence of viral mutation(s) within the areas targeted by this assay, and inadequate number of viral copies(<138 copies/mL). A negative result must be combined with clinical observations, patient history, and epidemiological information. The expected result is Negative.  Fact Sheet for Patients:  BloggerCourse.comhttps://www.fda.gov/media/152166/download  Fact Sheet for Healthcare Providers:  SeriousBroker.ithttps://www.fda.gov/media/152162/download  This test is no t yet approved or cleared by the [place] FDA and  has been authorized for detection and/or diagnosis of SARS-CoV-2 by FDA under an Emergency Use Authorization (EUA). This EUA will remain  in effect (meaning this test can be used) for the duration of the COVID-19 declaration under Section 564(b)(1) of the Act, 21 U.S.C.section 360bbb-3(b)(1), unless the authorization is terminated  or revoked sooner.       Influenza A by PCR NEGATIVE NEGATIVE Final   Influenza B by PCR NEGATIVE NEGATIVE Final    Comment: (NOTE) The Xpert Xpress SARS-CoV-2/FLU/RSV plus assay is intended as an aid in the diagnosis of influenza from Nasopharyngeal swab specimens and should not be used as a sole basis for treatment. Nasal washings and aspirates are unacceptable for  Xpert Xpress SARS-CoV-2/FLU/RSV testing.  Fact Sheet for Patients: BloggerCourse.comhttps://www.fda.gov/media/152166/download  Fact Sheet for Healthcare Providers: SeriousBroker.ithttps://www.fda.gov/media/152162/download  This test is not yet approved or cleared by the [place] FDA and has been authorized for detection and/or diagnosis of SARS-CoV-2 by FDA under an Emergency Use Authorization (EUA). This EUA will remain in effect (meaning this test can be used) for the duration of the COVID-19 declaration under Section 564(b)(1) of the Act, 21 U.S.C. section 360bbb-3(b)(1), unless the authorization is terminated or revoked.  Performed at [facility], [address] [place], [place] [address]   Culture, blood (routine x 2)     Status: None (Preliminary result)   Collection Time: 05/13/20  5:58 PM   Specimen: BLOOD  Result Value Ref Range Status   Specimen Description BLOOD LEFT ANTECUBITAL  Final   Special Requests   Final    BOTTLES DRAWN AEROBIC AND ANAEROBIC Blood Culture adequate volume   Culture   Final    NO GROWTH 4 DAYS Performed at [facility], [address] [place], [place] [address]    Report Status PENDING  Incomplete  Culture, blood (routine x 2)     Status: None (Preliminary result)   Collection Time: 05/13/20  6:05 PM   Specimen: BLOOD  Result Value Ref Range Status   Specimen Description BLOOD RIGHT ANTECUBITAL  Final   Special Requests   Final    BOTTLES DRAWN AEROBIC ONLY Blood Culture adequate volume   Culture   Final    NO GROWTH 4 DAYS Performed at [facility], [address] [place], [place] [address]    Report Status PENDING  Incomplete  SARS CORONAVIRUS 2 (TAT 6-24 HRS) Nasopharyngeal Nasopharyngeal Swab     Status: None   Collection Time: 05/15/20  9:56 PM   Specimen: Nasopharyngeal Swab  Result Value Ref Range Status   SARS Coronavirus 2 NEGATIVE NEGATIVE Final    Comment: (NOTE) SARS-CoV-2 target nucleic acids are NOT DETECTED.  The  SARS-CoV-2 RNA is generally detectable in upper and lower respiratory specimens during the acute phase of infection. Negative results do  not preclude SARS-CoV-2 infection, do not rule out co-infections with other pathogens, and should not be used as the sole basis for treatment or other patient management decisions. Negative results must be combined with clinical observations, patient history, and epidemiological information. The expected result is Negative.  Fact Sheet for Patients: HairSlick.no  Fact Sheet for Healthcare Providers: quierodirigir.com  This test is not yet approved or cleared by the [place] FDA and  has been authorized for detection and/or diagnosis of SARS-CoV-2 by FDA under an Emergency Use Authorization (EUA). This EUA will remain  in effect (meaning this test can be used) for the duration of the COVID-19 declaration under Se ction 564(b)(1) of the Act, 21 U.S.C. section 360bbb-3(b)(1), unless the authorization is terminated or revoked sooner.  Performed at [facility], [address], [place], [place] [address]     Procedures and diagnostic studies:  No results found.  Medications:   . aspirin EC  81 mg Oral Daily  . benztropine  0.5 mg Oral BID  . enoxaparin (LOVENOX) injection  40 mg Subcutaneous Q24H  . levothyroxine  50 mcg Oral QAC supper  . lithium carbonate  900 mg Oral QPC supper  . multivitamin with minerals  1 tablet Oral Daily  . risperiDONE  0.5 mg Oral BID  . [START ON 05/21/2020] thiamine injection  100 mg Intravenous Daily   Continuous Infusions: . sodium chloride 50 mL/hr at 05/17/20 0013  . thiamine injection 250 mg (05/16/20 8469)     LOS: 4 days   Joseph Art  [facility]   How to contact the [facility] Attending or Consulting provider 7A - 7P or covering provider during after hours 7P -7A, for this patient?  1. Check the care team in [facility] and look for a)  attending/consulting [facility] provider listed and b) the [facility] team listed 2. Log into [email or web address] and use  [facility] universal password to access. If you do not have the password, please contact the hospital operator. 3. Locate the [facility] provider you are looking for under [facility] and page to a number that you can be directly reached. 4. If you still have difficulty reaching the provider, please page the [facility] (Director on Call) for the Hospitalists listed on amion for assistance.  05/17/2020, 11:27 AM

## 2020-05-17 NOTE — TOC Progression Note (Signed)
Transition of Care [facility]) - Progression Note    Patient Details  Name: Lugene Beougher MRN: [id number] Date of Birth: August 26, 1945  Transition of [facility]) CM/SW Contact  Levada Schilling Phone Number: 05/17/2020, 8:20 AM  Clinical Narrative:    Pt's pasrr is pending.  TOC Team will continue to assist with disposition planning.   Expected Discharge Plan: Skilled Nursing Facility Barriers to Discharge: Continued Medical Work up  Expected Discharge Plan and Services Expected Discharge Plan: Skilled Nursing Facility   Discharge Planning Services: CM Consult   Living arrangements for the past 2 months: Single Family Home                                       Social Determinants of Health (SDOH) Interventions    Readmission Risk Interventions No flowsheet data found.

## 2020-05-18 LAB — CULTURE, BLOOD (ROUTINE X 2)
Culture: NO GROWTH
Culture: NO GROWTH
Special Requests: ADEQUATE
Special Requests: ADEQUATE

## 2020-07-06 ENCOUNTER — Ambulatory Visit: Payer: Medicare Other | Admitting: Neurology

## 2021-05-13 IMAGING — CT CT HEAD W/O CM
4 series · 15 of 47 positions shown, 17 images · non-contrast
Comparison: Brain MRI 04/24/2020.  Head CT 04/20/2010.

CLINICAL DATA: Head trauma, minor. Neck trauma. Additional history
obtained from electronic medical record: Unwitnessed fall today.

EXAM:
CT HEAD WITHOUT CONTRAST
CT CERVICAL SPINE WITHOUT CONTRAST
TECHNIQUE: Multidetector CT imaging of the head and cervical spine was
performed following the standard protocol without intravenous
contrast. Multiplanar CT image reconstructions of the cervical spine
were also generated.

[Series 3: head without · axial · non-contrast · 0.48mm/px · z∈[-172,-42]mm · 7 of 36 slices shown, 9 images]
[im 5/36  brain]
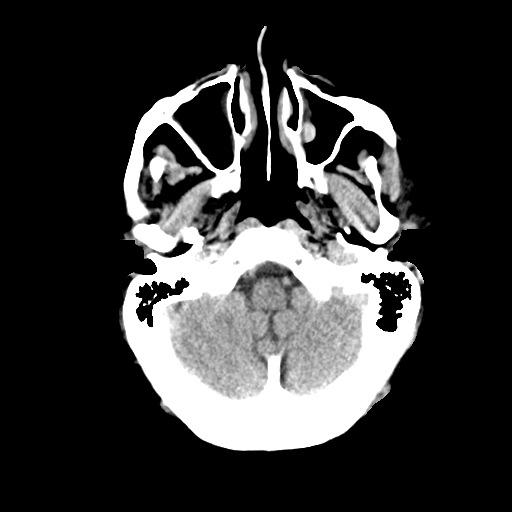
[im 5/36  bone]
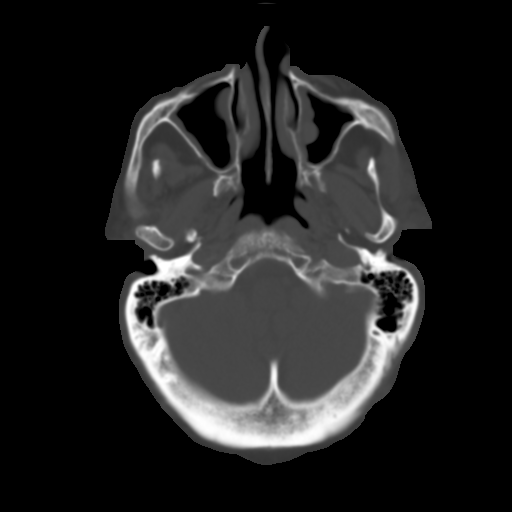
[im 9/36  brain]
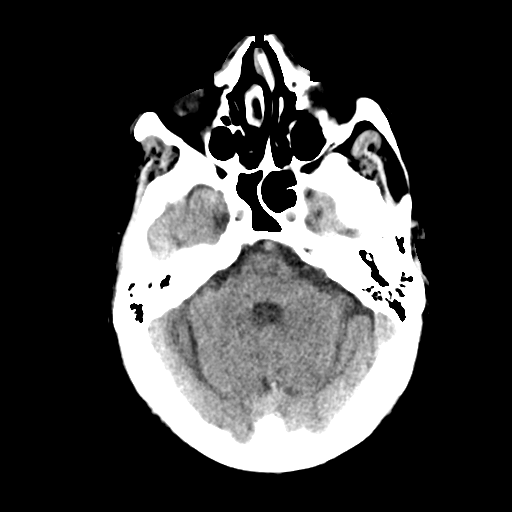
[im 14/36  brain]
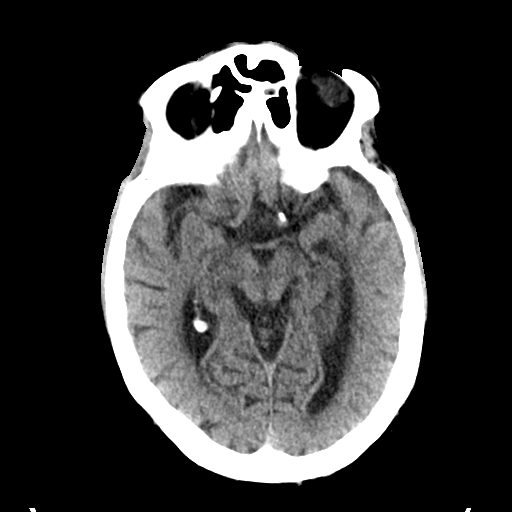
[im 18/36  brain]
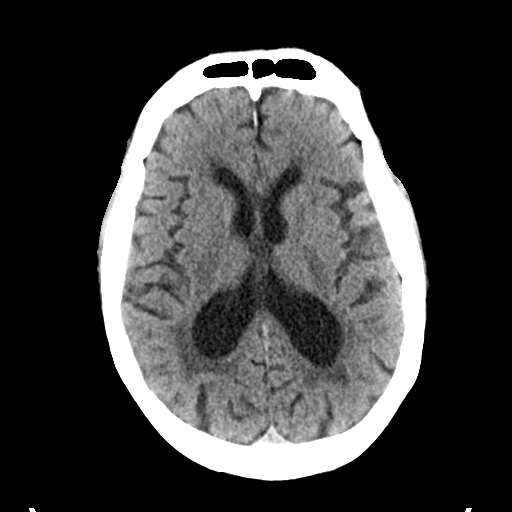
[im 22/36  brain]
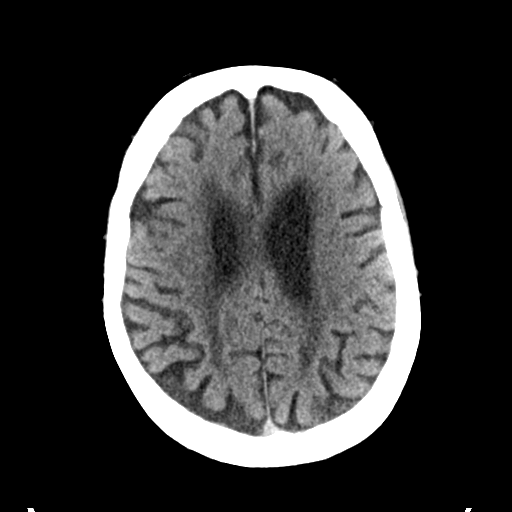
[im 22/36  bone]
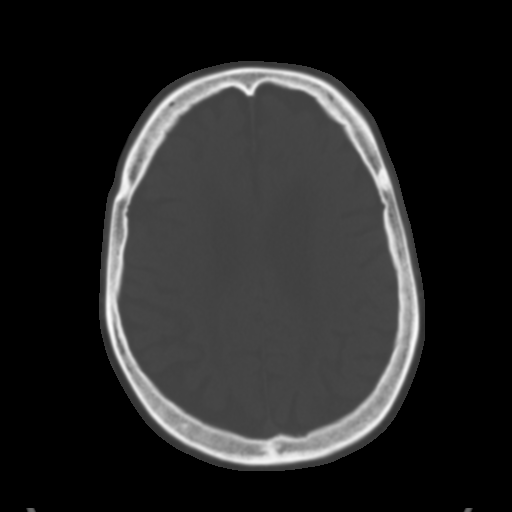
[im 27/36  brain]
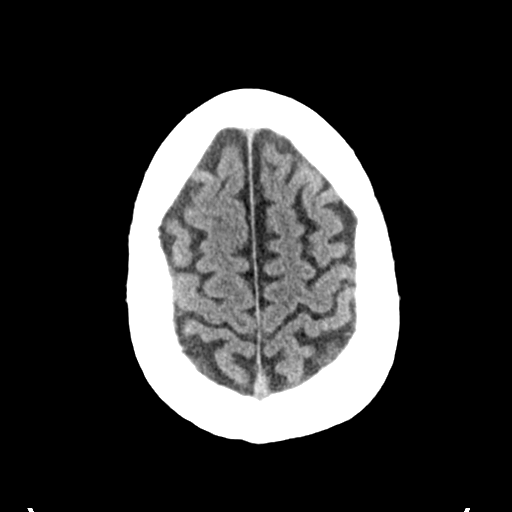
[im 31/36  brain]
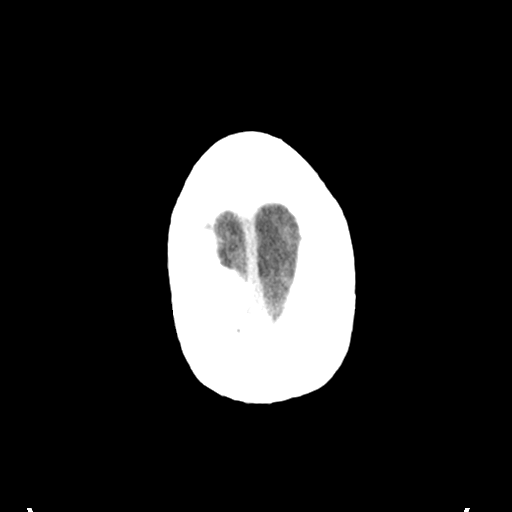

[Series 4: head bone · axial · 0.48mm/px · z∈[-176,-158]mm · 2 of 89 slices shown]
[im 9/89  bone]
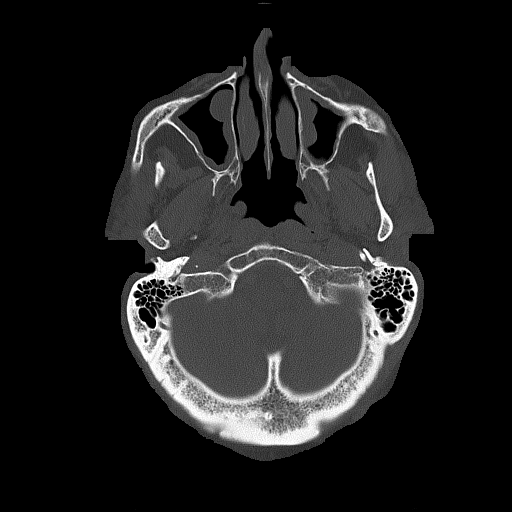
[im 18/89  bone]
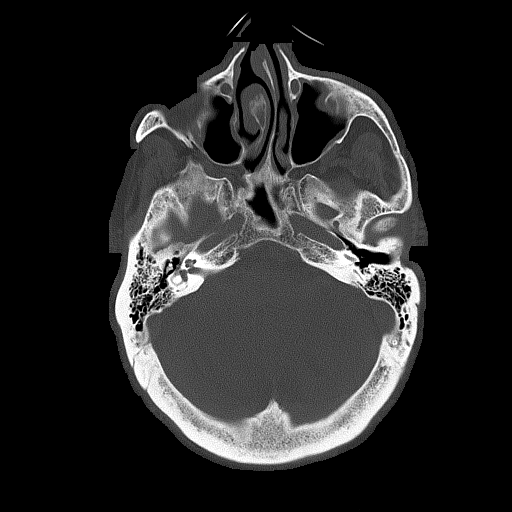

[Series 5: head without cor · coronal · non-contrast · 0.35mm/px · 3 of 79 slices shown]
[im 27/79  brain]
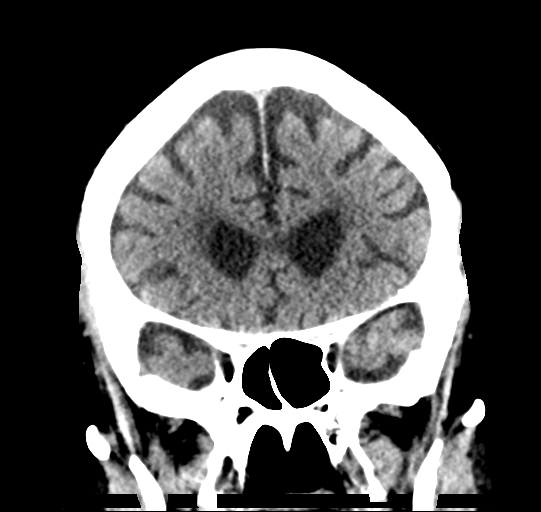
[im 35/79  brain]
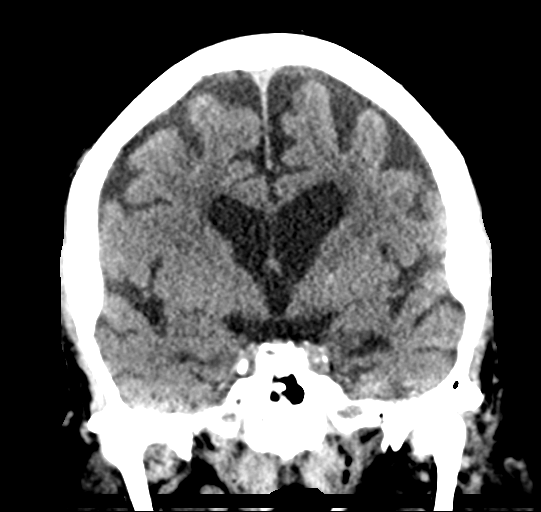
[im 44/79  brain]
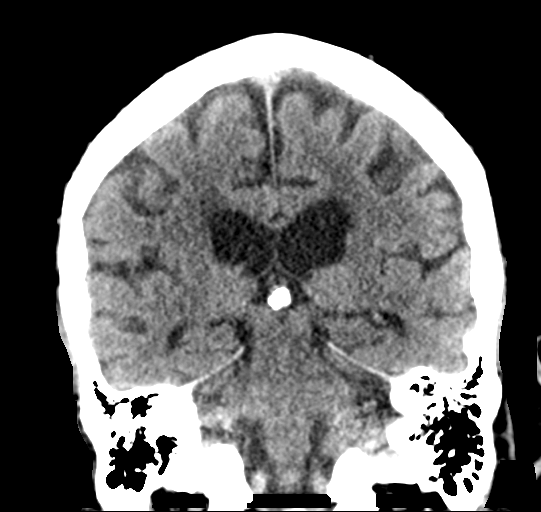

[Series 6: head without sag · sagittal · non-contrast · 0.35mm/px · 3 of 67 slices shown]
[im 23/67  brain]
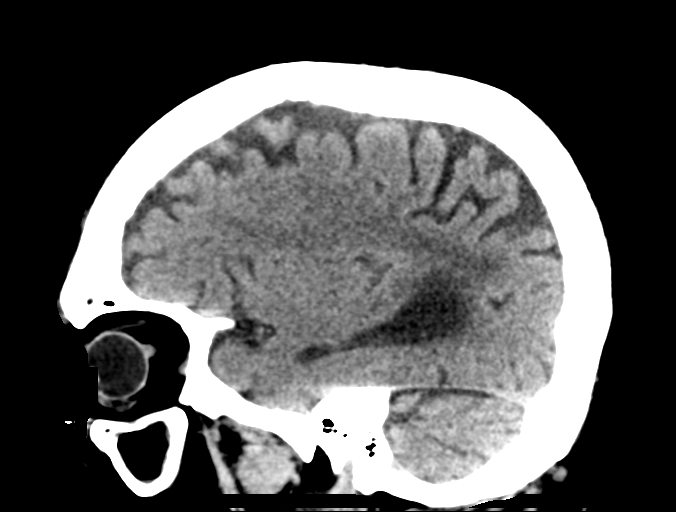
[im 34/67  brain]
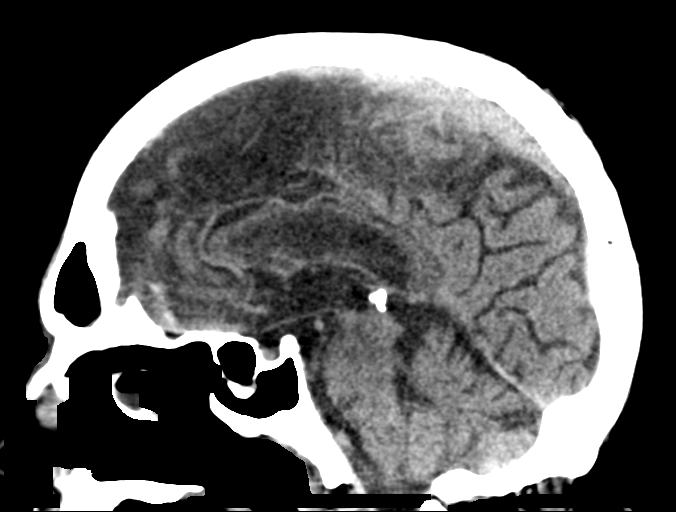
[im 45/67  brain]
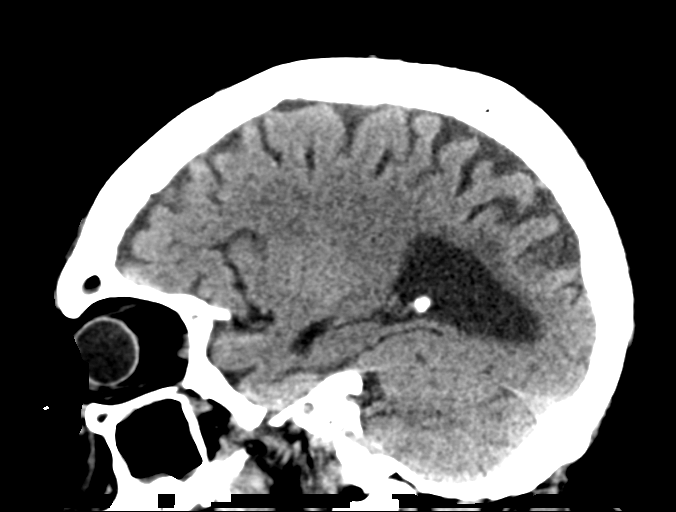

[15 of 47 positions shown; findings below may reference images not displayed]

FINDINGS: CT HEAD FINDINGS

Brain:

Mild cerebral and cerebellar atrophy.

Moderate ill-defined hypoattenuation within the cerebral white
matter is nonspecific, but compatible with chronic small vessel
ischemic disease.

Redemonstrated small chronic infarct within the left cerebellar
hemisphere.

There is no acute intracranial hemorrhage.

No demarcated cortical infarct.

No extra-axial fluid collection.

No evidence of intracranial mass.

No midline shift.

Vascular: No hyperdense vessel.  Atherosclerotic calcifications.

Skull: Normal. Negative for fracture or focal lesion.

Sinuses/Orbits: Visualized orbits show no acute finding. Mild
paranasal sinus mucosal thickening, most notably within the left
frontal, bilateral ethmoid and bilateral maxillary sinuses. Small
foci of polypoid mucosal thickening versus mucous retention cysts
within the bilateral maxillary sinuses.

CT CERVICAL SPINE FINDINGS

Alignment: Cervical levocurvature, possibly positional. Mild
nonspecific reversal of the expected cervical lordosis. 2 mm C2-C3
grade 1 anterolisthesis. Trace C7-T1 grade 1 anterolisthesis.

Skull base and vertebrae: The basion-dental and atlanto-dental
intervals are maintained.No evidence of acute fracture to the
cervical spine. Mild age-indeterminate anterior wedge compression
deformity of the T1 vertebral body (series 6, image 29).

Soft tissues and spinal canal: No prevertebral fluid or swelling. No
visible canal hematoma.

Disc levels: Cervical spondylosis with multilevel disc space
narrowing, disc bulges, uncovertebral hypertrophy and facet
arthrosis. Disc space narrowing is moderate/advanced at C4-C5, C5-C6
and C6-C7. facet joint ankylosis on the right and possibly on the
left at C2-C3.

Upper chest: No consolidation within the imaged lung apices. No
visible pneumothorax.
IMPRESSION: CT head:

1. No evidence of acute intracranial abnormality.
2. Mild generalized atrophy of the brain with moderate chronic small
vessel ischemic disease.
3. Redemonstrated small chronic infarct within the left cerebellar
hemisphere.
4. Paranasal sinus disease as described.

CT cervical spine:

1. Mild age-indeterminate anterior wedge compression deformity of
the T1 vertebral body.
2. No evidence of acute fracture to the cervical spine.
3. Nonspecific reversal of the expected cervical lordosis.
4. Cervical levocurvature, possibly positional.
5. Mild C2-C3 and C7-T1 grade 1 anterolisthesis.
6. Cervical spondylosis as described.
7. Facet joint ankylosis at C2-C3.

## 2021-06-15 ENCOUNTER — Inpatient Hospital Stay (HOSPITAL_COMMUNITY)
Admission: EM | Admit: 2021-06-15 | Discharge: 2021-07-11 | DRG: 154 | Disposition: A | Discharge status: Nursing Home (Includes ICF) | Payer: Medicare Other | Source: Skilled Nursing Facility | Attending: Internal Medicine | Admitting: Internal Medicine

## 2021-06-15 ENCOUNTER — Encounter (HOSPITAL_COMMUNITY): Payer: Self-pay | Admitting: Emergency Medicine

## 2021-06-15 ENCOUNTER — Emergency Department (HOSPITAL_COMMUNITY): Payer: Medicare Other

## 2021-06-15 DIAGNOSIS — G928 Other toxic encephalopathy: Secondary | ICD-10-CM | POA: Diagnosis present

## 2021-06-15 DIAGNOSIS — Z79890 Hormone replacement therapy (postmenopausal): Secondary | ICD-10-CM

## 2021-06-15 DIAGNOSIS — F319 Bipolar disorder, unspecified: Secondary | ICD-10-CM | POA: Diagnosis present

## 2021-06-15 DIAGNOSIS — R451 Restlessness and agitation: Secondary | ICD-10-CM

## 2021-06-15 DIAGNOSIS — Z66 Do not resuscitate: Secondary | ICD-10-CM | POA: Diagnosis present

## 2021-06-15 DIAGNOSIS — R5383 Other fatigue: Secondary | ICD-10-CM | POA: Diagnosis present

## 2021-06-15 DIAGNOSIS — Z20822 Contact with and (suspected) exposure to covid-19: Secondary | ICD-10-CM | POA: Diagnosis present

## 2021-06-15 DIAGNOSIS — E669 Obesity, unspecified: Secondary | ICD-10-CM | POA: Diagnosis present

## 2021-06-15 DIAGNOSIS — Z23 Encounter for immunization: Secondary | ICD-10-CM

## 2021-06-15 DIAGNOSIS — T17908A Unspecified foreign body in respiratory tract, part unspecified causing other injury, initial encounter: Secondary | ICD-10-CM | POA: Diagnosis present

## 2021-06-15 DIAGNOSIS — R739 Hyperglycemia, unspecified: Secondary | ICD-10-CM | POA: Diagnosis present

## 2021-06-15 DIAGNOSIS — G40909 Epilepsy, unspecified, not intractable, without status epilepticus: Secondary | ICD-10-CM | POA: Diagnosis present

## 2021-06-15 DIAGNOSIS — W25XXXA Contact with sharp glass, initial encounter: Secondary | ICD-10-CM | POA: Diagnosis present

## 2021-06-15 DIAGNOSIS — T426X5A Adverse effect of other antiepileptic and sedative-hypnotic drugs, initial encounter: Secondary | ICD-10-CM | POA: Diagnosis present

## 2021-06-15 DIAGNOSIS — Z8673 Personal history of transient ischemic attack (TIA), and cerebral infarction without residual deficits: Secondary | ICD-10-CM

## 2021-06-15 DIAGNOSIS — Z79899 Other long term (current) drug therapy: Secondary | ICD-10-CM

## 2021-06-15 DIAGNOSIS — Z781 Physical restraint status: Secondary | ICD-10-CM

## 2021-06-15 DIAGNOSIS — T17298A Other foreign object in pharynx causing other injury, initial encounter: Principal | ICD-10-CM | POA: Diagnosis present

## 2021-06-15 DIAGNOSIS — I10 Essential (primary) hypertension: Secondary | ICD-10-CM | POA: Diagnosis present

## 2021-06-15 DIAGNOSIS — Z87891 Personal history of nicotine dependence: Secondary | ICD-10-CM

## 2021-06-15 DIAGNOSIS — E039 Hypothyroidism, unspecified: Secondary | ICD-10-CM | POA: Diagnosis present

## 2021-06-15 DIAGNOSIS — F1027 Alcohol dependence with alcohol-induced persisting dementia: Secondary | ICD-10-CM | POA: Diagnosis present

## 2021-06-15 DIAGNOSIS — X58XXXA Exposure to other specified factors, initial encounter: Secondary | ICD-10-CM | POA: Diagnosis present

## 2021-06-15 DIAGNOSIS — R1310 Dysphagia, unspecified: Secondary | ICD-10-CM | POA: Diagnosis present

## 2021-06-15 DIAGNOSIS — R109 Unspecified abdominal pain: Secondary | ICD-10-CM

## 2021-06-15 DIAGNOSIS — M795 Residual foreign body in soft tissue: Secondary | ICD-10-CM | POA: Diagnosis present

## 2021-06-15 DIAGNOSIS — R4182 Altered mental status, unspecified: Secondary | ICD-10-CM | POA: Diagnosis not present

## 2021-06-15 DIAGNOSIS — E512 Wernicke's encephalopathy: Secondary | ICD-10-CM | POA: Diagnosis present

## 2021-06-15 DIAGNOSIS — F03918 Unspecified dementia, unspecified severity, with other behavioral disturbance: Secondary | ICD-10-CM | POA: Diagnosis present

## 2021-06-15 DIAGNOSIS — F04 Amnestic disorder due to known physiological condition: Secondary | ICD-10-CM | POA: Diagnosis present

## 2021-06-15 DIAGNOSIS — Y92128 Other place in nursing home as the place of occurrence of the external cause: Secondary | ICD-10-CM

## 2021-06-15 DIAGNOSIS — S61412A Laceration without foreign body of left hand, initial encounter: Secondary | ICD-10-CM | POA: Diagnosis present

## 2021-06-15 DIAGNOSIS — Z6833 Body mass index (BMI) 33.0-33.9, adult: Secondary | ICD-10-CM

## 2021-06-15 DIAGNOSIS — Z8249 Family history of ischemic heart disease and other diseases of the circulatory system: Secondary | ICD-10-CM

## 2021-06-15 DIAGNOSIS — Z809 Family history of malignant neoplasm, unspecified: Secondary | ICD-10-CM

## 2021-06-15 DIAGNOSIS — F1011 Alcohol abuse, in remission: Secondary | ICD-10-CM | POA: Diagnosis present

## 2021-06-15 DIAGNOSIS — G2119 Other drug induced secondary parkinsonism: Secondary | ICD-10-CM | POA: Diagnosis present

## 2021-06-15 DIAGNOSIS — D539 Nutritional anemia, unspecified: Secondary | ICD-10-CM | POA: Diagnosis present

## 2021-06-15 DIAGNOSIS — E890 Postprocedural hypothyroidism: Secondary | ICD-10-CM | POA: Diagnosis present

## 2021-06-15 DIAGNOSIS — S61512A Laceration without foreign body of left wrist, initial encounter: Secondary | ICD-10-CM | POA: Diagnosis present

## 2021-06-15 DIAGNOSIS — F1097 Alcohol use, unspecified with alcohol-induced persisting dementia: Secondary | ICD-10-CM | POA: Diagnosis present

## 2021-06-15 LAB — COMPREHENSIVE METABOLIC PANEL
ALT: 18 U/L (ref 0–44)
AST: 24 U/L (ref 15–41)
Albumin: 3.6 g/dL (ref 3.5–5.0)
Alkaline Phosphatase: 46 U/L (ref 38–126)
Anion gap: 5 (ref 5–15)
BUN: 28 mg/dL — ABNORMAL HIGH (ref 8–23)
CO2: 27 mmol/L (ref 22–32)
Calcium: 8.7 mg/dL — ABNORMAL LOW (ref 8.9–10.3)
Chloride: 109 mmol/L (ref 98–111)
Creatinine, Ser: 0.67 mg/dL (ref 0.61–1.24)
GFR, Estimated: >60 mL/min (ref >60)
Glucose, Bld: 204 mg/dL — ABNORMAL HIGH (ref 70–99)
Potassium: 3.8 mmol/L (ref 3.5–5.1)
Sodium: 141 mmol/L (ref 135–145)
Total Bilirubin: 0.1 mg/dL — ABNORMAL LOW (ref 0.3–1.2)
Total Protein: 5.8 g/dL — ABNORMAL LOW (ref 6.5–8.1)

## 2021-06-15 LAB — CBC WITH DIFFERENTIAL/PLATELET
Abs Immature Granulocytes: 0.01 10*3/uL (ref 0.00–0.07)
Basophils Absolute: 0.1 10*3/uL (ref 0.0–0.1)
Basophils Relative: 1 %
Eosinophils Absolute: 0.1 10*3/uL (ref 0.0–0.5)
Eosinophils Relative: 1 %
HCT: 32.7 % — ABNORMAL LOW (ref 39.0–52.0)
Hemoglobin: 10.4 g/dL — ABNORMAL LOW (ref 13.0–17.0)
Immature Granulocytes: 0 %
Lymphocytes Relative: 32 %
Lymphs Abs: 1.9 10*3/uL (ref 0.7–4.0)
MCH: 32.8 pg (ref 26.0–34.0)
MCHC: 31.8 g/dL (ref 30.0–36.0)
MCV: 103.2 fL — ABNORMAL HIGH (ref 80.0–100.0)
Monocytes Absolute: 0.7 10*3/uL (ref 0.1–1.0)
Monocytes Relative: 13 %
Neutro Abs: 3.1 10*3/uL (ref 1.7–7.7)
Neutrophils Relative %: 53 %
Platelets: 186 10*3/uL (ref 150–400)
RBC: 3.17 MIL/uL — ABNORMAL LOW (ref 4.22–5.81)
RDW: 12.6 % (ref 11.5–15.5)
WBC: 5.8 10*3/uL (ref 4.0–10.5)
nRBC: 0 % (ref 0.0–0.2)

## 2021-06-15 LAB — ETHANOL: Alcohol, Ethyl (B): <10 mg/dL (ref <10)

## 2021-06-15 LAB — RESP PANEL BY RT-PCR (FLU A&B, COVID) ARPGX2
Influenza A by PCR: NEGATIVE
Influenza B by PCR: NEGATIVE
SARS Coronavirus 2 by RT PCR: NEGATIVE

## 2021-06-15 MED ORDER — TETANUS-DIPHTH-ACELL PERTUSSIS 5-2.5-18.5 LF-MCG/0.5 IM SUSY
0.5000 mL | PREFILLED_SYRINGE | Freq: Once | INTRAMUSCULAR | Status: AC
  Administered 2021-06-15: 0.5 mL via INTRAMUSCULAR
  Filled 2021-06-15: qty 0.5

## 2021-06-15 NOTE — ED Triage Notes (Signed)
Patient here via EMS from [facility] reporting agitation. Patient reports that he became upset at the facility and demanded to leave. Advised that he could not leave. Reports that he punched a wall with left hand. Laceration -bleeding under control .

## 2021-06-15 NOTE — ED Notes (Signed)
Patient sitting in bed at this time time.  Appears calm

## 2021-06-15 NOTE — BH Assessment (Signed)
Clinician made contact with pt's nurse in an attempt to complete pt's [place] Assessment. Pt's nurse, Tiffany RN, moved the cart into pt's room so he could complete the assessment. Pt rolled the cart back out of his room, refusing to participate. TTS to attempt assessment at a later time.

## 2021-06-15 NOTE — ED Notes (Signed)
1 - jeans, 2- white socks, 1- pair of shoes, and 1- belt placed in belonging bag and secured at this time.
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
## 2021-06-15 NOTE — ED Notes (Signed)
[phone] - Trevor Cook - son

## 2021-06-15 NOTE — ED Notes (Signed)
Attempted to place TTS computer in patient's room for assessment.  Pt became agitated and got out of bed.  Pushed computer out of room and attempted to leave.  Was able to be assisted back to room by staff.  Did attempt to become combative and was trying to hit and kick staff.  Patient was calmed by staff.  L hand laceration repair was bandaged and personal clothing was removed.

## 2021-06-16 MED ORDER — LEVOTHYROXINE SODIUM 50 MCG PO TABS
50.0000 ug | ORAL_TABLET | Freq: Every day | ORAL | Status: DC
  Administered 2021-06-16 – 2021-06-21 (×6): 50 ug via ORAL
  Filled 2021-06-16 (×6): qty 1

## 2021-06-16 MED ORDER — THIAMINE HCL 100 MG PO TABS
100.0000 mg | ORAL_TABLET | Freq: Every day | ORAL | Status: DC
  Administered 2021-06-16 – 2021-07-11 (×25): 100 mg via ORAL
  Filled 2021-06-16 (×25): qty 1

## 2021-06-16 MED ORDER — ADULT MULTIVITAMIN W/MINERALS CH
1.0000 | ORAL_TABLET | Freq: Every day | ORAL | Status: DC
  Administered 2021-06-16 – 2021-07-11 (×25): 1 via ORAL
  Filled 2021-06-16 (×25): qty 1

## 2021-06-16 MED ORDER — BENZTROPINE MESYLATE 0.5 MG PO TABS
0.5000 mg | ORAL_TABLET | Freq: Two times a day (BID) | ORAL | Status: DC
  Administered 2021-06-16 – 2021-06-20 (×8): 0.5 mg via ORAL
  Filled 2021-06-16 (×11): qty 1

## 2021-06-16 NOTE — ED Provider Notes (Signed)
Emergency Medicine Observation Re-evaluation Note  Trevor Cook is a 76 y.o. male, seen on rounds today.  Pt initially presented to the ED for complaints of Laceration and Agitation Currently, the patient is calm.  Physical Exam  BP (!) 138/57 (BP Location: Right Arm)    Pulse (!) 59    Temp 97.9 F (36.6 C) (Oral)    Resp 17    SpO2 100%  Physical Exam General: awake and alert Cardiac: rrr Lungs: ctab Psych: calm  ED Course / MDM  EKG:EKG Interpretation  Date/Time:  Friday June 15 2021 15:00:29 EST Ventricular Rate:  55 PR Interval:  208 QRS Duration: 110 QT Interval:  440 QTC Calculation: 420 R Axis:   56 Text Interpretation: Sinus bradycardia Otherwise normal ECG When compared with ECG of 12-May-2020 19:50, PREVIOUS ECG IS PRESENT Confirmed by Kommor, Madison ([id number]) on 06/15/2021 4:29:40 PM  I have reviewed the labs performed to date as well as medications administered while in observation.  Recent changes in the last 24 hours include awaiting assessment for placement.  Plan  Current plan is for placement.  [name] is not under involuntary commitment.     [name], MD 06/16/21 [phone]

## 2021-06-16 NOTE — ED Notes (Signed)
Spoke with Thayer Ohm on the phone with plan of care

## 2021-06-16 NOTE — BH Assessment (Signed)
Clinician made contact with pt's nurse, Tiffany RN, in an attempt to complete pt's [place] Assessment. Pt's nurse stated pt became aggressive towards staff and attempted to leave when the assessment was attempted earlier and expressed the thought that pt cannot consent to an assessment at this time due to his mental status. TTS to attempt assessment at a later time.

## 2021-06-16 NOTE — BH Assessment (Addendum)
This clinician spoke with RN Alfonzo Feller.  Bobby and clinician discussed that there is no guardian listed.  Pt is very confused acting and cannot follow simple requests per [place].  Pt has a dx of Wernicke's encephalopathy.  The son is Oluwaseun Cremer and this clinician did call his number but there was no answer and the voicemail was not set up with his name so no message was left.   Two previous TTS clinicians attempted to see patient but he did not cooperate in either instance.  Given his dx it is improbable that the patient can really participate in a CCA and provide meaningful information.
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
## 2021-06-17 ENCOUNTER — Emergency Department (HOSPITAL_COMMUNITY): Payer: Medicare Other

## 2021-06-17 MED ORDER — DIPHENHYDRAMINE HCL 25 MG PO CAPS
25.0000 mg | ORAL_CAPSULE | Freq: Four times a day (QID) | ORAL | Status: DC | PRN
  Administered 2021-06-17 – 2021-06-18 (×3): 25 mg via ORAL
  Filled 2021-06-17 (×3): qty 1

## 2021-06-17 NOTE — ED Provider Notes (Signed)
Emergency Medicine Observation Re-evaluation Note  Trevor Cook is a 76 y.o. male, seen on rounds today.  Pt initially presented to the ED for complaints of Laceration and Agitation Currently, the patient is calm.  Physical Exam  BP 140/89 (BP Location: Left Arm)    Pulse (!) 50    Temp 97.9 F (36.6 C) (Oral)    Resp 20    SpO2 98%  Physical Exam General: awake and alert Cardiac: rrr Lungs: ctab Psych: calm  ED Course / MDM  EKG:EKG Interpretation  Date/Time:  Friday June 15 2021 15:00:29 EST Ventricular Rate:  55 PR Interval:  208 QRS Duration: 110 QT Interval:  440 QTC Calculation: 420 R Axis:   56 Text Interpretation: Sinus bradycardia Otherwise normal ECG When compared with ECG of 12-May-2020 19:50, PREVIOUS ECG IS PRESENT Confirmed by Kommor, Madison ([id number]) on 06/15/2021 4:29:40 PM  I have reviewed the labs performed to date as well as medications administered while in observation.  Recent changes in the last 24 hours include awaiting placement.  Plan  Current plan is for awaiting placement.  Trevor Cook is not under involuntary commitment.     Trevor Lefevre, MD 06/17/21 [phone]

## 2021-06-17 NOTE — TOC CM/SW Note (Signed)
Phone call to [facility] [phone] , left message with resident coordinator Crystal to discuss patient's ability to return once stable.  Transition of Care to continue to follow  [facility], LCSW Transition of Care [phone]

## 2021-06-17 NOTE — ED Notes (Signed)
Patient alert this shift. Patient confused and nonsensical. Patient cooperative with care. Patient medication compliant. Redirectable. Patient ambulatory.
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
## 2021-06-17 NOTE — ED Notes (Signed)
Order for benadryl obtained and given for agitation and restlessness.

## 2021-06-18 ENCOUNTER — Emergency Department (HOSPITAL_COMMUNITY): Payer: Medicare Other

## 2021-06-18 ENCOUNTER — Encounter (HOSPITAL_COMMUNITY): Payer: Self-pay | Admitting: Internal Medicine

## 2021-06-18 DIAGNOSIS — F1027 Alcohol dependence with alcohol-induced persisting dementia: Secondary | ICD-10-CM | POA: Diagnosis present

## 2021-06-18 DIAGNOSIS — D539 Nutritional anemia, unspecified: Secondary | ICD-10-CM | POA: Diagnosis present

## 2021-06-18 DIAGNOSIS — T17908A Unspecified foreign body in respiratory tract, part unspecified causing other injury, initial encounter: Secondary | ICD-10-CM

## 2021-06-18 DIAGNOSIS — R739 Hyperglycemia, unspecified: Secondary | ICD-10-CM | POA: Diagnosis present

## 2021-06-18 LAB — CBC WITH DIFFERENTIAL/PLATELET
Abs Immature Granulocytes: 0.02 10*3/uL (ref 0.00–0.07)
Basophils Absolute: 0 10*3/uL (ref 0.0–0.1)
Basophils Relative: 0 %
Eosinophils Absolute: 0.1 10*3/uL (ref 0.0–0.5)
Eosinophils Relative: 1 %
HCT: 39.5 % (ref 39.0–52.0)
Hemoglobin: 12.5 g/dL — ABNORMAL LOW (ref 13.0–17.0)
Immature Granulocytes: 0 %
Lymphocytes Relative: 12 %
Lymphs Abs: 0.8 10*3/uL (ref 0.7–4.0)
MCH: 32.6 pg (ref 26.0–34.0)
MCHC: 31.6 g/dL (ref 30.0–36.0)
MCV: 102.9 fL — ABNORMAL HIGH (ref 80.0–100.0)
Monocytes Absolute: 0.7 10*3/uL (ref 0.1–1.0)
Monocytes Relative: 11 %
Neutro Abs: 4.9 10*3/uL (ref 1.7–7.7)
Neutrophils Relative %: 76 %
Platelets: 209 10*3/uL (ref 150–400)
RBC: 3.84 MIL/uL — ABNORMAL LOW (ref 4.22–5.81)
RDW: 12.6 % (ref 11.5–15.5)
WBC: 6.5 10*3/uL (ref 4.0–10.5)
nRBC: 0 % (ref 0.0–0.2)

## 2021-06-18 LAB — PHOSPHORUS: Phosphorus: 3.4 mg/dL (ref 2.5–4.6)

## 2021-06-18 LAB — RESP PANEL BY RT-PCR (FLU A&B, COVID) ARPGX2
Influenza A by PCR: NEGATIVE
Influenza B by PCR: NEGATIVE
SARS Coronavirus 2 by RT PCR: NEGATIVE

## 2021-06-18 LAB — BASIC METABOLIC PANEL
Anion gap: 7 (ref 5–15)
BUN: 25 mg/dL — ABNORMAL HIGH (ref 8–23)
CO2: 25 mmol/L (ref 22–32)
Calcium: 8.8 mg/dL — ABNORMAL LOW (ref 8.9–10.3)
Chloride: 107 mmol/L (ref 98–111)
Creatinine, Ser: 0.84 mg/dL (ref 0.61–1.24)
GFR, Estimated: >60 mL/min (ref >60)
Glucose, Bld: 126 mg/dL — ABNORMAL HIGH (ref 70–99)
Potassium: 3.8 mmol/L (ref 3.5–5.1)
Sodium: 139 mmol/L (ref 135–145)

## 2021-06-18 LAB — LACTIC ACID, PLASMA: Lactic Acid, Venous: 1 mmol/L (ref 0.5–1.9)

## 2021-06-18 LAB — HEMOGLOBIN A1C
Hgb A1c MFr Bld: 4.4 % — ABNORMAL LOW (ref 4.8–5.6)
Mean Plasma Glucose: 79.58 mg/dL

## 2021-06-18 LAB — MAGNESIUM: Magnesium: 1.9 mg/dL (ref 1.7–2.4)

## 2021-06-18 MED ORDER — DIPHENHYDRAMINE HCL 50 MG/ML IJ SOLN
25.0000 mg | Freq: Once | INTRAMUSCULAR | Status: DC
  Filled 2021-06-18: qty 1

## 2021-06-18 MED ORDER — ACETAMINOPHEN 325 MG PO TABS
650.0000 mg | ORAL_TABLET | Freq: Four times a day (QID) | ORAL | Status: DC | PRN
  Administered 2021-07-08: 650 mg via ORAL
  Filled 2021-06-18: qty 2

## 2021-06-18 MED ORDER — OLANZAPINE 5 MG PO TBDP
2.5000 mg | ORAL_TABLET | Freq: Once | ORAL | Status: AC
  Administered 2021-06-18: 2.5 mg via ORAL
  Filled 2021-06-18: qty 1

## 2021-06-18 MED ORDER — ENOXAPARIN SODIUM 40 MG/0.4ML IJ SOSY
40.0000 mg | PREFILLED_SYRINGE | INTRAMUSCULAR | Status: DC
  Administered 2021-06-18 – 2021-07-02 (×9): 40 mg via SUBCUTANEOUS
  Filled 2021-06-18 (×14): qty 0.4

## 2021-06-18 MED ORDER — DIPHENHYDRAMINE HCL 50 MG/ML IJ SOLN
25.0000 mg | Freq: Once | INTRAMUSCULAR | Status: AC
  Administered 2021-06-18: 25 mg via INTRAVENOUS

## 2021-06-18 MED ORDER — MEMANTINE HCL 10 MG PO TABS
5.0000 mg | ORAL_TABLET | Freq: Every day | ORAL | Status: DC
  Administered 2021-06-18 – 2021-07-10 (×23): 5 mg via ORAL
  Filled 2021-06-18 (×23): qty 1

## 2021-06-18 MED ORDER — METHYLPREDNISOLONE SODIUM SUCC 40 MG IJ SOLR
40.0000 mg | Freq: Once | INTRAMUSCULAR | Status: AC
  Administered 2021-06-18: 40 mg via INTRAVENOUS
  Filled 2021-06-18: qty 1

## 2021-06-18 MED ORDER — ACETAMINOPHEN 650 MG RE SUPP: 650.0000 mg | Freq: Four times a day (QID) | RECTAL | Status: DC | PRN

## 2021-06-18 MED ORDER — ONDANSETRON HCL 4 MG/2ML IJ SOLN: 4.0000 mg | Freq: Four times a day (QID) | INTRAMUSCULAR | Status: DC | PRN

## 2021-06-18 MED ORDER — SODIUM CHLORIDE 0.9 % IV SOLN
3.0000 g | Freq: Four times a day (QID) | INTRAVENOUS | Status: DC
  Administered 2021-06-18 – 2021-06-20 (×9): 3 g via INTRAVENOUS
  Filled 2021-06-18 (×12): qty 8

## 2021-06-18 MED ORDER — ONDANSETRON HCL 4 MG PO TABS: 4.0000 mg | ORAL_TABLET | Freq: Four times a day (QID) | ORAL | Status: DC | PRN

## 2021-06-18 MED ORDER — HALOPERIDOL LACTATE 5 MG/ML IJ SOLN
5.0000 mg | Freq: Once | INTRAMUSCULAR | Status: AC
Start: 2021-06-18 — End: 2021-06-19
  Administered 2021-06-19: 5 mg via INTRAMUSCULAR
  Filled 2021-06-18: qty 1
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
## 2021-06-18 NOTE — ED Notes (Signed)
After AM med pass, pt. Started to cough shortly after with audible breath sounds. Pt. O2 sats dropped to 85% RA. He was connected to 4 L via [place] with sats increasing to 88%. EDP was notified and present at beside. Pt. Began to drool. Drool was suctioned and pt. Was transferred to ED room 1.

## 2021-06-18 NOTE — Progress Notes (Signed)
Pt is very agitated. Page sent to Fayrene Fearing, NP for haldol and restraints. Waiting for response. EMT sitter w/ pt at this time.

## 2021-06-18 NOTE — ED Notes (Signed)
Patient is now resting and calm. VS will be obtained once awake
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
## 2021-06-18 NOTE — H&P (Signed)
History and Physical    Patient: Trevor Cook DOB: 1945-08-19 DOA: 06/15/2021 DOS: the patient was seen and examined on 06/18/2021 PCP: Tamsen Roers, MD  Patient coming from: SNF  Chief Complaint:  Chief Complaint  Patient presents with   Laceration   Agitation    HPI: Trevor Cook is a 76 y.o. male with medical history significant of alcohol induced persistent dementia, bipolar 1 disorder, epilepsy, cerebellar disorder, hypothyroidism, insomnia, Warnicke's Korsakoff who was brought to the emergency department from his facility due to agitation as he was threatening to leave and was punching the walls.  He only answers simple questions.however, he is currently sedated and unable to provide any history or information.  This morning, the patient had cough with hypoxia after having an aspiration episode while trying to swallow pills.  Please see nursing notes for further details.   ED course: Initial vital signs were temperature 97.9 F, pulse 58, respiration 18, BP 143/78 mmHg O2 sat 98% on room air.  Received Zyprexa 2.5 mg p.o. earlier in the ER.  I added Solu-Medrol 125 mg IVP and started Unasyn.  Lab work: CBC is her white count 6.5, hemoglobin 12.5 g/dL platelets 209.  Coronavirus influenza PCR was negative.  Lactic acid was normal.  BMP showed normal electrolytes when calcium corrected to albumin.  Glucose 126 and BUN 25.  3 days ago the patient had a glucose level of 204 mg/dL.  Hemoglobin A1c was performed and it was low at 4.4%.  Imaging: A portable 1 view chest radiograph showed no acute finding.  CT head done yesterday did not show any acute intracranial pathology.  Review of Systems: As mentioned in the history of present illness. All other systems reviewed and are negative. 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[place])    Bipolar 1 disorder ([place])    Cerebellar disorder    Epilepsy ([place])    Hypothyroid    Insomnia     Other postablative hypothyroidism    Stutter    Tremor    Unspecified condition of brain    Wernicke's encephalopathy    Past Surgical History:  Procedure Laterality Date   NO PAST SURGERIES     Social History:  reports that he has quit smoking. He has never used smokeless tobacco. He reports that he does not currently use alcohol. He reports that he does not use drugs.  No Known Allergies  Family History  Problem Relation Age of Onset   Congestive Heart Failure Mother    Cancer Father     Prior to Admission medications   Medication Sig Start Date End Date Taking? Authorizing Provider  ALPRAZolam Duanne Moron) 0.5 MG tablet Take 1 tablet (0.5 mg total) by mouth 2 (two) times daily as needed for anxiety. 05/17/20   Geradine Girt, DO  benztropine (COGENTIN) 0.5 MG tablet Take 1 tablet (0.5 mg total) by mouth 2 (two) times daily. 05/17/20   Geradine Girt, DO  levothyroxine (SYNTHROID, LEVOTHROID) 50 MCG tablet Take 50 mcg by mouth daily after supper.    [provider]  lithium carbonate 300 MG capsule Take 900 mg by mouth daily after supper.    [provider]  Multiple Vitamin (MULTIVITAMIN WITH MINERALS) TABS tablet Take 1 tablet by mouth daily.    [provider]  risperiDONE (RISPERDAL) 0.5 MG tablet Take 1 tablet (0.5 mg total) by mouth 2 (two) times daily. 05/17/20   Geradine Girt, DO  thiamine 100  MG tablet Take 1 tablet (100 mg total) by mouth daily. 05/17/20   Geradine Girt, DO    Physical Exam: Vitals:   06/18/21 0637 06/18/21 0730 06/18/21 0745 06/18/21 0800  BP: 123/72  115/69   Pulse: (!) 55 78 72 71  Resp: 20  18   Temp: 98.8 F (37.1 C)     TempSrc: Axillary     SpO2: 97% 94% 94% 96%   Physical Exam Constitutional:      Appearance: He is obese.  HENT:     Head: Normocephalic and atraumatic.     Mouth/Throat:     Mouth: Mucous membranes are moist.  Eyes:     Pupils: Pupils are equal, round, and reactive to light.  Cardiovascular:      Rate and Rhythm: Normal rate and regular rhythm.  Pulmonary:     Effort: Pulmonary effort is normal. No respiratory distress.     Breath sounds: Wheezing and rhonchi present.  Abdominal:     General: There is no distension.     Palpations: Abdomen is soft.     Tenderness: There is no abdominal tenderness.  Musculoskeletal:     Cervical back: Neck supple.     Right lower leg: No edema.     Left lower leg: No edema.  Skin:    General: Skin is warm and dry.  Neurological:     Mental Status: He is disoriented.     Data Reviewed:  There are no new results to review at this time.  Assessment and Plan: Principal Problem:   Aspiration into airway Observation/telemetry. Supplemental oxygen as needed. Bronchodilators as needed. Unasyn every 6 hours. Solu-Medrol 40 mg single dose given. Follow-up CBC and CMP. SLP recommended dysphagia 2 diet with supervision.  Active Problems:   Essential hypertension BP has been soft unless he is restless. Monitor blood pressure.    Hypothyroidism Continue levothyroxine 50 mcg daily.    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 Reorient as needed. Behavioral health is following.    Drug-induced parkinsonism (HCC) Continue Cogentin.    Macrocytic anemia Monitor hematocrit and hemoglobin.    Hyperglycemia Check hemoglobin A1c was 4.4%.      Advance Care Planning:   Code Status: DNR   Consults: Behavioral health  Family Communication:   Severity of Illness: The appropriate patient status for this patient is OBSERVATION. Observation status is judged to be reasonable and necessary in order to provide the required intensity of service to ensure the patient's safety. The patient's presenting symptoms, physical exam findings, and initial radiographic and laboratory data in the context of their medical condition is felt to place them at decreased  risk for further clinical deterioration. Furthermore, it is anticipated that the patient will be medically stable for discharge from the hospital within 2 midnights of admission.   Author: Reubin Milan, MD 06/18/2021 10:42 AM  For on call review [email or web address].   This document was prepared using Dragon voice recognition software and may contain some unintended transcription errors.

## 2021-06-18 NOTE — ED Notes (Signed)
Claxton Levitz (son) [phone]

## 2021-06-18 NOTE — Progress Notes (Signed)
SLP Cancellation Note  Patient Details Name: Trevor Cook MRN: [id number] DOB: May 24, 1945   Cancelled treatment:       Reason Eval/Treat Not Completed: Patient's level of consciousness;Other (comment) (patient only minimally arousable. he started to pound hand on bed, appearing to start becoming agitated but this quickly subsided. SLP will attempt to f/u later this PM.)   Angela Nevin, MA, CCC-SLP Speech Therapy

## 2021-06-19 DIAGNOSIS — I10 Essential (primary) hypertension: Secondary | ICD-10-CM

## 2021-06-19 DIAGNOSIS — T17908A Unspecified foreign body in respiratory tract, part unspecified causing other injury, initial encounter: Secondary | ICD-10-CM | POA: Diagnosis not present

## 2021-06-19 DIAGNOSIS — G2119 Other drug induced secondary parkinsonism: Secondary | ICD-10-CM | POA: Diagnosis not present

## 2021-06-19 LAB — CBC
HCT: 37 % — ABNORMAL LOW (ref 39.0–52.0)
Hemoglobin: 11.7 g/dL — ABNORMAL LOW (ref 13.0–17.0)
MCH: 32.4 pg (ref 26.0–34.0)
MCHC: 31.6 g/dL (ref 30.0–36.0)
MCV: 102.5 fL — ABNORMAL HIGH (ref 80.0–100.0)
Platelets: 194 10*3/uL (ref 150–400)
RBC: 3.61 MIL/uL — ABNORMAL LOW (ref 4.22–5.81)
RDW: 12.3 % (ref 11.5–15.5)
WBC: 8.3 10*3/uL (ref 4.0–10.5)
nRBC: 0 % (ref 0.0–0.2)

## 2021-06-19 LAB — COMPREHENSIVE METABOLIC PANEL
ALT: 23 U/L (ref 0–44)
AST: 27 U/L (ref 15–41)
Albumin: 3.5 g/dL (ref 3.5–5.0)
Alkaline Phosphatase: 45 U/L (ref 38–126)
Anion gap: 7 (ref 5–15)
BUN: 26 mg/dL — ABNORMAL HIGH (ref 8–23)
CO2: 26 mmol/L (ref 22–32)
Calcium: 9.1 mg/dL (ref 8.9–10.3)
Chloride: 107 mmol/L (ref 98–111)
Creatinine, Ser: 0.75 mg/dL (ref 0.61–1.24)
GFR, Estimated: >60 mL/min (ref >60)
Glucose, Bld: 106 mg/dL — ABNORMAL HIGH (ref 70–99)
Potassium: 4.6 mmol/L (ref 3.5–5.1)
Sodium: 140 mmol/L (ref 135–145)
Total Bilirubin: 0.3 mg/dL (ref 0.3–1.2)
Total Protein: 6.1 g/dL — ABNORMAL LOW (ref 6.5–8.1)

## 2021-06-19 NOTE — Progress Notes (Signed)
°  Progress Note   Patient: Trevor Cook [id number] DOB: [birth year] DOA: 06/15/2021     0 DOS: the patient was seen and examined on 06/19/2021   Brief hospital course: 76 y.o. male with medical history significant of alcohol induced persistent dementia, bipolar 1 disorder, epilepsy, cerebellar disorder, hypothyroidism, insomnia, Warnicke's Korsakoff who was brought to the emergency department from his facility due to agitation as he was threatening to leave and was punching the walls.  He only answers simple questions.however, he is currently sedated and unable to provide any history or information.  This morning, the patient had cough with hypoxia after having an aspiration episode while trying to swallow pills.  Please see nursing notes for further details  Assessment and Plan: No notes have been filed under this hospital service. Service: Hospitalist  Principal Problem:   Aspiration into airway Cont with bronchodilators as needed. Continued on Unasyn No leukocytosis and afebrile Noted to have increased confusion and agitation, likely worse by recent aspiration SLP recommended dysphagia 2 diet with supervision.   Active Problems:   Essential hypertension BP stable at this time Monitor blood pressure.     Hypothyroidism Continue levothyroxine 50 mcg daily. 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 Reorient as needed. Noted to be more confused overnight, currently requiring wrist and ankle restraints for safety Pt had been seen by [facility] in ED and has been cleared psychiatrically as of 2/12     Drug-induced parkinsonism ([place]) Continue Cogentin.     Macrocytic anemia Monitor hematocrit and hemoglobin. -Hemodynamically stable     Hyperglycemia  A1c noted to be 4.4%. Random glucose stable  Toxic metabolic encephalopathy  -Staff reports pt seems more  confused this AM -Currently requiring B wrist and ankle restraints -d/c restraints as able    Subjective: Wanting to go home  Physical Exam: Vitals:   06/18/21 2356 06/19/21 0356 06/19/21 0829 06/19/21 1400  BP: (!) 142/74 (!) 137/93 (!) 105/50 (!) 148/81  Pulse: 69 76 (!) 54 (!) 53  Resp: 20 20 20 20   Temp: 97.8 F (36.6 C) 97.9 F (36.6 C) 98 F (36.7 C) 97.8 F (36.6 C)  TempSrc: Axillary Axillary    SpO2: 98% 96% 100% 99%   General exam: Awake, laying in bed, in nad Respiratory system: Normal respiratory effort, no wheezing Cardiovascular system: regular rate, s1, s2 Gastrointestinal system: Soft, nondistended, positive BS Central nervous system: CN2-12 grossly intact, strength intact Extremities: Perfused, no clubbing Skin: Normal skin turgor, no notable skin lesions seen Psychiatry: appears anxious, in restraints  Data Reviewed:  Labs reviewed, electrolytes normal, renal function stable,   Family Communication: Pt in room, family not at bedside  Disposition: Status is: Observation The patient remains OBS appropriate and will d/c before 2 midnights.        Planned Discharge Destination:  Pending PT eval      Author: Marylu Lund, MD 06/19/2021 4:16 PM  For on call review [email or web address].

## 2021-06-19 NOTE — Progress Notes (Signed)
Speech Language Pathology Treatment: Dysphagia  Patient Details Name: Trevor Cook MRN: [id number] DOB: 1945/08/13 Today's Date: 06/19/2021 Time: 1007-1219 SLP Time Calculation (min) (ACUTE ONLY): 31 min  Assessment / Plan / Recommendation Clinical Impression  Skilled SLP indicated for dysphagia goals after his initial BSE.  SLP upgrade po trials commenced including [place] sandwich, ice cream, chocolate Ensure and water.  Pt with obvious right facial asymmetry - likely from prior cva.  He was willing to consume po readily - consuming 3 graham crackers dunked in ice cream, 4 ounces ice cream, full [place] sandwich, 1 ounce Ensure and 2 ounces water.  His hand tremor *right more than left* is severe and negatively impacts his self feeding.  He did hold his cup with his left hand and fed sandwich with both hands.  Hand over hand assist (tactile cues) provided at times to both slow rapid rate of po intake and decrease tremor.  Cough noted x3 during entire snack - occuring primarily after icecream  = causing SlP to question if due to mucus coating.  He is making excellent progress with po intake, overall tolerance. REcommend to consider advancing diet to dys3/thin with strict precautions, full assist and encouraging hand over hand assist. Informed NT of recommendations and posted swallow precaution signs. Suspect episode of potential aspiration was due to his AMS/lethargy.  Will follow up after advancement for family education and implementation of effective strategies.    HPI HPI: Patient is a 76 y.o. male with PMH: alcohol abuse, metabolic encephalopathy, alcohol induced persisting dementia, Wernicke's encephalopathy, CVA and hypothyroidism who presented to [facility] ED from [facility] for agitation where it was reported he punched a wall after demanding to leave. In ED, patient alert and oriented only to self, unable to follow commands, making sounds and primative speech. He was made NPO pending speech  swallow evaluation.  Follow up indicated to assure po tolerance and assess indication for instrumental swallow evaluation. Per NT, pt ate well today without overt coughing.      SLP Plan  Continue with current plan of care      Recommendations for follow up therapy are one component of a multi-disciplinary discharge planning process, led by the attending physician.  Recommendations may be updated based on patient status, additional functional criteria and insurance authorization.    Recommendations  Diet recommendations: Dysphagia 3 (mechanical soft);Thin liquid Liquids provided via: Cup;Straw Medication Administration: Whole meds with puree Supervision: Staff to assist with self feeding Compensations: Slow rate;Small sips/bites;Minimize environmental distractions (check for right sided pocketing, pt eats at rapid rate - assure slow rate please) Postural Changes and/or Swallow Maneuvers: Seated upright 90 degrees;Upright 30-60 min after meal                Oral Care Recommendations: Oral care BID;Staff/trained caregiver to provide oral care Follow Up Recommendations: No SLP follow up Assistance recommended at discharge: None SLP Visit Diagnosis: Dysphagia, unspecified (R13.10);Dysphagia, oral phase (R13.11) Plan: Continue with current plan of care          Trevor Infante, MS [facility] SLP Acute Rehab Services Office [phone] Pager [phone]  Trevor Cook  06/19/2021, 5:50 PM

## 2021-06-20 DIAGNOSIS — E039 Hypothyroidism, unspecified: Secondary | ICD-10-CM

## 2021-06-20 DIAGNOSIS — G2119 Other drug induced secondary parkinsonism: Secondary | ICD-10-CM | POA: Diagnosis present

## 2021-06-20 DIAGNOSIS — T17908A Unspecified foreign body in respiratory tract, part unspecified causing other injury, initial encounter: Secondary | ICD-10-CM | POA: Diagnosis not present

## 2021-06-20 DIAGNOSIS — Z20822 Contact with and (suspected) exposure to covid-19: Secondary | ICD-10-CM | POA: Diagnosis present

## 2021-06-20 DIAGNOSIS — T17298A Other foreign object in pharynx causing other injury, initial encounter: Secondary | ICD-10-CM | POA: Diagnosis present

## 2021-06-20 DIAGNOSIS — G928 Other toxic encephalopathy: Secondary | ICD-10-CM | POA: Diagnosis present

## 2021-06-20 DIAGNOSIS — R451 Restlessness and agitation: Secondary | ICD-10-CM | POA: Diagnosis not present

## 2021-06-20 DIAGNOSIS — F1027 Alcohol dependence with alcohol-induced persisting dementia: Secondary | ICD-10-CM | POA: Diagnosis not present

## 2021-06-20 DIAGNOSIS — Z809 Family history of malignant neoplasm, unspecified: Secondary | ICD-10-CM | POA: Diagnosis not present

## 2021-06-20 DIAGNOSIS — Z8673 Personal history of transient ischemic attack (TIA), and cerebral infarction without residual deficits: Secondary | ICD-10-CM | POA: Diagnosis not present

## 2021-06-20 DIAGNOSIS — X58XXXA Exposure to other specified factors, initial encounter: Secondary | ICD-10-CM | POA: Diagnosis present

## 2021-06-20 DIAGNOSIS — G40909 Epilepsy, unspecified, not intractable, without status epilepticus: Secondary | ICD-10-CM | POA: Diagnosis present

## 2021-06-20 DIAGNOSIS — F1097 Alcohol use, unspecified with alcohol-induced persisting dementia: Secondary | ICD-10-CM | POA: Diagnosis present

## 2021-06-20 DIAGNOSIS — Y92128 Other place in nursing home as the place of occurrence of the external cause: Secondary | ICD-10-CM | POA: Diagnosis not present

## 2021-06-20 DIAGNOSIS — D539 Nutritional anemia, unspecified: Secondary | ICD-10-CM | POA: Diagnosis present

## 2021-06-20 DIAGNOSIS — W25XXXA Contact with sharp glass, initial encounter: Secondary | ICD-10-CM | POA: Diagnosis present

## 2021-06-20 DIAGNOSIS — E512 Wernicke's encephalopathy: Secondary | ICD-10-CM | POA: Diagnosis present

## 2021-06-20 DIAGNOSIS — E890 Postprocedural hypothyroidism: Secondary | ICD-10-CM | POA: Diagnosis present

## 2021-06-20 DIAGNOSIS — F03918 Unspecified dementia, unspecified severity, with other behavioral disturbance: Secondary | ICD-10-CM | POA: Diagnosis not present

## 2021-06-20 DIAGNOSIS — I10 Essential (primary) hypertension: Secondary | ICD-10-CM | POA: Diagnosis present

## 2021-06-20 DIAGNOSIS — Z23 Encounter for immunization: Secondary | ICD-10-CM | POA: Diagnosis present

## 2021-06-20 DIAGNOSIS — F319 Bipolar disorder, unspecified: Secondary | ICD-10-CM | POA: Diagnosis present

## 2021-06-20 DIAGNOSIS — E669 Obesity, unspecified: Secondary | ICD-10-CM | POA: Diagnosis present

## 2021-06-20 DIAGNOSIS — Z8249 Family history of ischemic heart disease and other diseases of the circulatory system: Secondary | ICD-10-CM | POA: Diagnosis not present

## 2021-06-20 DIAGNOSIS — S61412A Laceration without foreign body of left hand, initial encounter: Secondary | ICD-10-CM | POA: Diagnosis present

## 2021-06-20 DIAGNOSIS — S61512A Laceration without foreign body of left wrist, initial encounter: Secondary | ICD-10-CM | POA: Diagnosis present

## 2021-06-20 DIAGNOSIS — R4182 Altered mental status, unspecified: Secondary | ICD-10-CM | POA: Diagnosis present

## 2021-06-20 DIAGNOSIS — Z66 Do not resuscitate: Secondary | ICD-10-CM | POA: Diagnosis present

## 2021-06-20 LAB — CBC
HCT: 37.1 % — ABNORMAL LOW (ref 39.0–52.0)
Hemoglobin: 12 g/dL — ABNORMAL LOW (ref 13.0–17.0)
MCH: 32.5 pg (ref 26.0–34.0)
MCHC: 32.3 g/dL (ref 30.0–36.0)
MCV: 100.5 fL — ABNORMAL HIGH (ref 80.0–100.0)
Platelets: 212 10*3/uL (ref 150–400)
RBC: 3.69 MIL/uL — ABNORMAL LOW (ref 4.22–5.81)
RDW: 12.2 % (ref 11.5–15.5)
WBC: 7.9 10*3/uL (ref 4.0–10.5)
nRBC: 0 % (ref 0.0–0.2)

## 2021-06-20 LAB — COMPREHENSIVE METABOLIC PANEL
ALT: 23 U/L (ref 0–44)
AST: 24 U/L (ref 15–41)
Albumin: 3.6 g/dL (ref 3.5–5.0)
Alkaline Phosphatase: 49 U/L (ref 38–126)
Anion gap: 6 (ref 5–15)
BUN: 20 mg/dL (ref 8–23)
CO2: 29 mmol/L (ref 22–32)
Calcium: 8.9 mg/dL (ref 8.9–10.3)
Chloride: 105 mmol/L (ref 98–111)
Creatinine, Ser: 0.66 mg/dL (ref 0.61–1.24)
GFR, Estimated: >60 mL/min (ref >60)
Glucose, Bld: 94 mg/dL (ref 70–99)
Potassium: 4.5 mmol/L (ref 3.5–5.1)
Sodium: 140 mmol/L (ref 135–145)
Total Bilirubin: 0.2 mg/dL — ABNORMAL LOW (ref 0.3–1.2)
Total Protein: 6 g/dL — ABNORMAL LOW (ref 6.5–8.1)

## 2021-06-20 NOTE — Assessment & Plan Note (Addendum)
Patient with recent prolonged hospitalization. Secondary to prolonged alcohol use. -Continue MVI, thiamine

## 2021-06-20 NOTE — TOC Initial Note (Signed)
Transition of Care [facility]) - Initial/Assessment Note    Patient Details  Name: Trevor Cook MRN: [id number] Date of Birth: 26-Dec-1945  [facility] CM/SW Contact:    Ida Rogue, LCSW Phone Number: 06/20/2021, 2:34 PM  Clinical Narrative:   Spoke with Ms Crystal at [facility].  She states that they had received patient from [facility] less than 48 hours before they had to call for help with him due to agitated, aggressive behavior.  She estimates that he had been at [facility] approximately 3 months, and that his son would have further information, both about what lead up to hospitalization at [facility] as well as possible re-admission there.  I left a message for son. In the meantime, she states patient came to them on Divalproex 500TID, Lithium 300BID, Zyprexa 5TID and PRN and Levodopa.  TOC will continue to follow during the course of hospitalization.                Expected Discharge Plan:  (TBD) Barriers to Discharge: Continued Medical Work up   Patient Goals and CMS Choice Patient states their goals for this hospitalization and ongoing recovery are:: Unable to answer      Expected Discharge Plan and Services Expected Discharge Plan:  (TBD)       Living arrangements for the past 2 months:  ([facility])                                      Prior Living Arrangements/Services Living arrangements for the past 2 months:  ([facility]) Lives with:: Facility Resident Patient language and need for interpreter reviewed:: Yes        Need for Family Participation in Patient Care: Yes (Comment) Care giver support system in place?: Yes (comment)   Criminal Activity/Legal Involvement Pertinent to Current Situation/Hospitalization: No - Comment as needed  Activities of Daily Living      Permission Sought/Granted      Share Information with NAME: Skeeter, Sheard (Son)   [phone]            Emotional Assessment       Orientation: : Oriented to Self Alcohol / Substance Use: Not Applicable Psych Involvement: Yes (comment)  Admission diagnosis:  Aspiration into airway, initial encounter [T17.908A] Patient Active Problem List   Diagnosis Date Noted   Aspiration into airway, initial encounter 06/18/2021   Macrocytic anemia 06/18/2021   Hyperglycemia 06/18/2021   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HCC) 06/18/2021   Acute metabolic encephalopathy 05/13/2020   Encephalopathy 05/13/2020   Toxic metabolic encephalopathy 05/13/2020   Drug-induced parkinsonism (HCC)    Fall    History of alcohol abuse 04/07/2020   Tremor 09/20/2013   Essential hypertension 09/20/2013   Hypothyroidism 09/20/2013   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/20/2013   PCP:  Aida Puffer, MD Pharmacy:   [id number] - [place], [place] - [address] AT [facility] [address] [place] [address] Phone: [phone] Fax: [phone]     Social Determinants of Health (SDOH) Interventions    Readmission Risk Interventions No flowsheet data found.

## 2021-06-20 NOTE — Progress Notes (Signed)
Pt requires constant redirection Pt cannot use the bathroom independently, pt staes "I can't see" while urinating on the bathroom floor

## 2021-06-20 NOTE — Assessment & Plan Note (Addendum)
Patient presented secondary to agitation and aggression. While in the ED, he suffered an aspiration event and was admitted. No evidence of pneumonia clinically or on imaging. Started empirically on Unasyn. Speech therapy consulted Per chart history, patient has a history of dysphagia. Unasyn discontinued. Speech therapy recommends dysphagia 3 diet thin liquid, assist with feeding.

## 2021-06-20 NOTE — Hospital Course (Addendum)
Trevor Cook is a 76 y.o. male with a history of dementia, bipolar 1 disorder, epilepsy, CVA, hypothyroidism, Wernicke-Korsakoff syndrome. Patient presented secondary to agitation and aggression. While in the ED, he suffered an aspiration event and was admitted. Psychiatry consulted for discharge recommendations. Medically stable as of 2/28 to be transferred to memory care unit at ALF.

## 2021-06-20 NOTE — Assessment & Plan Note (Addendum)
Complicated by underlying diagnosis of Wernicke's Korsakoff disease. Patient recently required hospitalization secondary to behavioral disorder. It appears patient was discharged on Depakote 500 mg TID, Zyprexa 5 mg BID, Zyprexa 2.5 mg BID PRN agitation and Cogentin 0.5 mg BID.  Patient presented with agitation and aggression. Intermittently requiring restrained due to agitation. Has been out of the restraints since 2/22. Ambulating multiple times in the hallway with a sitter. Currently plan is to transition to locked memory care unit. -Psychiatry was consulted, recommendations: Zyprexa 7.5 mg twice daily,  Depakote 500 mg a.m. p.m. and 750 mg at night. Cogentin discontinued.  Lithium discontinued.

## 2021-06-20 NOTE — Assessment & Plan Note (Addendum)
Continue Synthroid 75 mcg daily.

## 2021-06-20 NOTE — Assessment & Plan Note (Deleted)
No longed drinking alcohol per son.

## 2021-06-20 NOTE — Assessment & Plan Note (Addendum)
Noted. Stable Continue carbidopa-levodopa 25-100 mg TID

## 2021-06-20 NOTE — Assessment & Plan Note (Signed)
Not on antiplatelet therapy. Patient possibly with resultant dysphagia. Noted.

## 2021-06-20 NOTE — Consult Note (Signed)
°  Trevor Cook is a 76 y.o. male that has a previous history of bipolar disorder, alcohol use disorder in remission since 1999, Wernicke Korsakoff, and stroke in 2021, who was brought to [facility] via nursing facility due to agitation, increased aggression, and suspected aspiration..   Pt admitted under IVC.  Pt recently discharged from [facility] after a 45-month length of stay.  Patient has also been discharged from multiple ALF facilities due to his aggression and agitation.  During this admission patient has been exhibiting sundowners, not sleeping, restless, psychomotor agitation, and increase in physical aggression which has resulted in restraints.    Patient seen today, unable to be assessed.  His eyes do track, although he will not respond verbally.  Nursing staff does report patient was engaging in conversation, alert and oriented this morning.  He does not appear to be in any acute distress at this time.   -Current diagnosis of dementia, recommend one-to-one safety sitter night shift only. -Recommend initiating delirium precautions -Will order routine labs to include valproic acid level, lithium level, ammonia level. -Patient with suspected aspiration, currently clinically stable however may have some worsening cognitive impairment to lag behind improvement of labs.  -Limit use of restraints as this may increase, risk for delirium. -Continue current medication as patient was recently discharged from [facility] 1 week ago after a 19-month length of stay.  -Will attempt to obtain collateral from son.  Psychiatry will attempt to assess tomorrow, and make further recommendations.

## 2021-06-20 NOTE — Progress Notes (Signed)
PROGRESS NOTE    Trevor Cook  EGB:[id number] DOB: July 12, 1945 DOA: 06/15/2021 PCP: Aida Puffer, MD   Brief Narrative: Trevor Cook is a 76 y.o. male with a history of dementia, bipolar 1 disorder, epilepsy, CVA, hypothyroidism, Wernicke-Korsakoff syndrome. Patient presented secondary to agitation and aggression. While in the ED, he suffered an aspiration event and was admitted. Psychiatry consulted for discharge recommendations.   Assessment & Plan:   Assessment and Plan: * Aspiration into airway, initial encounter- (present on admission) No evidence of pneumonia clinically or on imaging. Aspiration of pills. Speech therapy consulted for recommendations. Started empirically on Unasyn. Per chart history, patient has a history of dysphagia. -Discontinue Unasyn -SLP recommendations (2/14): Diet recommendations: Dysphagia 3 (mechanical soft);Thin liquid Liquids provided via: Cup;Straw Medication Administration: Whole meds with puree Supervision: Staff to assist with self feeding Compensations: Slow rate;Small sips/bites;Minimize environmental distractions (check for right sided pocketing, pt eats at rapid rate - assure slow rate please) Postural Changes and/or Swallow Maneuvers: Seated upright 90 degrees;Upright 30-60 min after meal  History of CVA (cerebrovascular accident) Not on antiplatelet therapy. Patient possibly with resultant dysphagia. Noted.  Dementia with behavioral disturbance Complicated by underlying diagnosis of Wernicke's disease. Patient recently required hospitalization secondary to behavioral disorder. It appears patient was discharged on Depakote 500 mg TID, Zyprexa 5 mg BID, Zyprexa 2.5 mg BID PRN agitation and Cogentin 0.5 mg BID -Psychiatry consulted  Wernicke's disease Patient with recent prolonged hospitalization. Secondary to prolonged alcohol use. 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 Stable. Per  recent discharge summary, it appears patient was discharged on carbidopa-levodopa 25-100 mg TID  History of alcohol abuse- (present on admission) No longed drinking alcohol per son.  Hypothyroidism- (present on admission) -Continue Synthroid     DVT prophylaxis: Lovenox Code Status:   Code Status: DNR Family Communication: Son at bedside Disposition Plan: Discharge pending psychiatry recommendations   Consultants:  Psychiatry  Procedures:  None  Antimicrobials: Unasyn    Subjective: No concerns per patient.  Objective: Vitals:   06/19/21 1400 06/19/21 2030 06/20/21 0327 06/20/21 1218  BP: (!) 148/81 (!) 133/52 (!) 149/93 (!) 142/120  Pulse: (!) 53 64 84 67  Resp: 20 20 20 16   Temp: 97.8 F (36.6 C) 98.1 F (36.7 C) 97.9 F (36.6 C) 98.6 F (37 C)  TempSrc:  Axillary Axillary Oral  SpO2: 99% 100% 97% 97%    Intake/Output Summary (Last 24 hours) at 06/20/2021 1930 Last data filed at 06/20/2021 1427 Gross per 24 hour  Intake 1784 ml  Output 400 ml  Net 1384 ml   There were no vitals filed for this visit.  Examination:  General exam: Appears calm and comfortable Respiratory system: Clear to auscultation. Respiratory effort normal. Cardiovascular system: S1 & S2 heard, RRR. No murmurs, rubs, gallops or clicks. Gastrointestinal system: Abdomen is nondistended, soft and nontender. No organomegaly or masses felt. Normal bowel sounds heard. Central nervous system: Alert. Tremor of upper extremities Musculoskeletal: No edema. No calf tenderness Skin: No cyanosis. No rashes    Data Reviewed: I have personally reviewed following labs and imaging studies  CBC Lab Results  Component Value Date   WBC 7.9 06/20/2021   RBC 3.69 (L) 06/20/2021   HGB 12.0 (L) 06/20/2021   HCT 37.1 (L) 06/20/2021   MCV 100.5 (H) 06/20/2021   MCH 32.5 06/20/2021   PLT 212 06/20/2021   MCHC 32.3 06/20/2021   RDW 12.2 06/20/2021   LYMPHSABS 0.8 06/18/2021   MONOABS  0.7  06/18/2021   EOSABS 0.1 06/18/2021   BASOSABS 0.0 06/18/2021     Last metabolic panel Lab Results  Component Value Date   NA 140 06/20/2021   K 4.5 06/20/2021   CL 105 06/20/2021   CO2 29 06/20/2021   BUN 20 06/20/2021   CREATININE 0.66 06/20/2021   GLUCOSE 94 06/20/2021   GFRNONAA >60 06/20/2021   GFRAA 100 04/07/2020   CALCIUM 8.9 06/20/2021   PHOS 3.4 06/18/2021   PROT 6.0 (L) 06/20/2021   ALBUMIN 3.6 06/20/2021   LABGLOB 2.3 04/07/2020   AGRATIO 1.8 04/07/2020   BILITOT 0.2 (L) 06/20/2021   ALKPHOS 49 06/20/2021   AST 24 06/20/2021   ALT 23 06/20/2021   ANIONGAP 6 06/20/2021    CBG (last 3)  No results for input(s): GLUCAP in the last 72 hours.   GFR: CrCl cannot be calculated (Unknown ideal weight.).  Coagulation Profile: No results for input(s): INR, PROTIME in the last 168 hours.  Recent Results (from the past 240 hour(s))  Resp Panel by RT-PCR (Flu A&B, Covid) Nasopharyngeal Swab     Status: None   Collection Time: 06/15/21  2:37 PM   Specimen: Nasopharyngeal Swab; Nasopharyngeal(NP) swabs in vial transport medium  Result Value Ref Range Status   SARS Coronavirus 2 by RT PCR NEGATIVE NEGATIVE Final    Comment: (NOTE) SARS-CoV-2 target nucleic acids are NOT DETECTED.  The SARS-CoV-2 RNA is generally detectable in upper respiratory specimens during the acute phase of infection. The lowest concentration of SARS-CoV-2 viral copies this assay can detect is 138 copies/mL. A negative result does not preclude SARS-Cov-2 infection and should not be used as the sole basis for treatment or other patient management decisions. A negative result may occur with  improper specimen collection/handling, submission of specimen other than nasopharyngeal swab, presence of viral mutation(s) within the areas targeted by this assay, and inadequate number of viral copies(<138 copies/mL). A negative result must be combined with clinical observations, patient history, and  epidemiological information. The expected result is Negative.  Fact Sheet for Patients:  BloggerCourse.com  Fact Sheet for Healthcare Providers:  SeriousBroker.it  This test is no t yet approved or cleared by the [place] FDA and  has been authorized for detection and/or diagnosis of SARS-CoV-2 by FDA under an Emergency Use Authorization (EUA). This EUA will remain  in effect (meaning this test can be used) for the duration of the COVID-19 declaration under Section 564(b)(1) of the Act, 21 U.S.C.section 360bbb-3(b)(1), unless the authorization is terminated  or revoked sooner.       Influenza A by PCR NEGATIVE NEGATIVE Final   Influenza B by PCR NEGATIVE NEGATIVE Final    Comment: (NOTE) The Xpert Xpress SARS-CoV-2/FLU/RSV plus assay is intended as an aid in the diagnosis of influenza from Nasopharyngeal swab specimens and should not be used as a sole basis for treatment. Nasal washings and aspirates are unacceptable for Xpert Xpress SARS-CoV-2/FLU/RSV testing.  Fact Sheet for Patients: BloggerCourse.com  Fact Sheet for Healthcare Providers: SeriousBroker.it  This test is not yet approved or cleared by the [place] FDA and has been authorized for detection and/or diagnosis of SARS-CoV-2 by FDA under an Emergency Use Authorization (EUA). This EUA will remain in effect (meaning this test can be used) for the duration of the COVID-19 declaration under Section 564(b)(1) of the Act, 21 U.S.C. section 360bbb-3(b)(1), unless the authorization is terminated or revoked.  Performed at [facility], [address] [place],  [place] [id number]   Resp Panel by RT-PCR (Flu A&B, Covid)     Status: None   Collection Time: 06/18/21  7:34 AM   Specimen: Nasopharyngeal(NP) swabs in vial transport medium  Result Value Ref Range Status   SARS Coronavirus 2 by RT  PCR NEGATIVE NEGATIVE Final    Comment: (NOTE) SARS-CoV-2 target nucleic acids are NOT DETECTED.  The SARS-CoV-2 RNA is generally detectable in upper respiratory specimens during the acute phase of infection. The lowest concentration of SARS-CoV-2 viral copies this assay can detect is 138 copies/mL. A negative result does not preclude SARS-Cov-2 infection and should not be used as the sole basis for treatment or other patient management decisions. A negative result may occur with  improper specimen collection/handling, submission of specimen other than nasopharyngeal swab, presence of viral mutation(s) within the areas targeted by this assay, and inadequate number of viral copies(<138 copies/mL). A negative result must be combined with clinical observations, patient history, and epidemiological information. The expected result is Negative.  Fact Sheet for Patients:  BloggerCourse.comhttps://www.fda.gov/media/152166/download  Fact Sheet for Healthcare Providers:  SeriousBroker.ithttps://www.fda.gov/media/152162/download  This test is no t yet approved or cleared by the [place] FDA and  has been authorized for detection and/or diagnosis of SARS-CoV-2 by FDA under an Emergency Use Authorization (EUA). This EUA will remain  in effect (meaning this test can be used) for the duration of the COVID-19 declaration under Section 564(b)(1) of the Act, 21 U.S.C.section 360bbb-3(b)(1), unless the authorization is terminated  or revoked sooner.       Influenza A by PCR NEGATIVE NEGATIVE Final   Influenza B by PCR NEGATIVE NEGATIVE Final    Comment: (NOTE) The Xpert Xpress SARS-CoV-2/FLU/RSV plus assay is intended as an aid in the diagnosis of influenza from Nasopharyngeal swab specimens and should not be used as a sole basis for treatment. Nasal washings and aspirates are unacceptable for Xpert Xpress SARS-CoV-2/FLU/RSV testing.  Fact Sheet for Patients: BloggerCourse.comhttps://www.fda.gov/media/152166/download  Fact Sheet for  Healthcare Providers: SeriousBroker.ithttps://www.fda.gov/media/152162/download  This test is not yet approved or cleared by the [place] FDA and has been authorized for detection and/or diagnosis of SARS-CoV-2 by FDA under an Emergency Use Authorization (EUA). This EUA will remain in effect (meaning this test can be used) for the duration of the COVID-19 declaration under Section 564(b)(1) of the Act, 21 U.S.C. section 360bbb-3(b)(1), unless the authorization is terminated or revoked.  Performed at [facility], [address] [place], [place] [address]   Culture, blood (routine x 2)     Status: None (Preliminary result)   Collection Time: 06/18/21  7:34 AM   Specimen: BLOOD  Result Value Ref Range Status   Specimen Description   Final    BLOOD LEFT ANTECUBITAL Performed at [facility], [address] [place], [place] [address]    Special Requests   Final    BOTTLES DRAWN AEROBIC AND ANAEROBIC Blood Culture adequate volume Performed at [facility], [address] [place], [place] [address]    Culture   Final    NO GROWTH 2 DAYS Performed at [facility], [address] [place], [place] [address]    Report Status PENDING  Incomplete  Culture, blood (routine x 2)     Status: None (Preliminary result)   Collection Time: 06/18/21  7:34 AM   Specimen: BLOOD  Result Value Ref Range Status   Specimen Description   Final    BLOOD RIGHT ANTECUBITAL Performed at [facility], [address], [place], [place] [address]    Special Requests   Final    BOTTLES DRAWN AEROBIC AND ANAEROBIC Blood Culture adequate volume Performed at [facility], [address] [place], [place] [address]    Culture   Final    NO GROWTH 2 DAYS Performed at [facility], [address] [place], [place] [address]    Report Status PENDING  Incomplete        Radiology Studies: No results  found.      Scheduled Meds:  benztropine  0.5 mg Oral BID   enoxaparin (LOVENOX) injection  40 mg Subcutaneous Q24H   levothyroxine  50 mcg Oral Q0600   memantine  5 mg Oral QHS   multivitamin with minerals  1 tablet Oral Daily   thiamine  100 mg Oral Daily   Continuous Infusions:   LOS: 0 days     Jacquelin Hawking, MD [facility] 06/20/2021, 7:30 PM  If 7PM-7AM, please contact night-coverage [email or web address]

## 2021-06-21 ENCOUNTER — Inpatient Hospital Stay (HOSPITAL_COMMUNITY)
Admit: 2021-06-21 | Discharge: 2021-06-21 | Disposition: A | Discharge status: Home / Self Care | Payer: Medicare Other | Attending: Family Medicine | Admitting: Family Medicine

## 2021-06-21 DIAGNOSIS — F1027 Alcohol dependence with alcohol-induced persisting dementia: Secondary | ICD-10-CM | POA: Diagnosis not present

## 2021-06-21 DIAGNOSIS — Z8673 Personal history of transient ischemic attack (TIA), and cerebral infarction without residual deficits: Secondary | ICD-10-CM | POA: Diagnosis not present

## 2021-06-21 DIAGNOSIS — I10 Essential (primary) hypertension: Secondary | ICD-10-CM | POA: Diagnosis not present

## 2021-06-21 DIAGNOSIS — F03918 Unspecified dementia, unspecified severity, with other behavioral disturbance: Secondary | ICD-10-CM | POA: Diagnosis not present

## 2021-06-21 DIAGNOSIS — R5383 Other fatigue: Secondary | ICD-10-CM | POA: Diagnosis present

## 2021-06-21 DIAGNOSIS — T17908A Unspecified foreign body in respiratory tract, part unspecified causing other injury, initial encounter: Secondary | ICD-10-CM | POA: Diagnosis not present

## 2021-06-21 LAB — LITHIUM LEVEL: Lithium Lvl: <0.06 mmol/L — ABNORMAL LOW (ref 0.60–1.20)

## 2021-06-21 LAB — URINALYSIS, ROUTINE W REFLEX MICROSCOPIC
Bilirubin Urine: NEGATIVE
Glucose, UA: NEGATIVE mg/dL
Hgb urine dipstick: NEGATIVE
Ketones, ur: NEGATIVE mg/dL
Leukocytes,Ua: NEGATIVE
Nitrite: NEGATIVE
Protein, ur: NEGATIVE mg/dL
Specific Gravity, Urine: 1.006 (ref 1.005–1.030)
pH: 8 (ref 5.0–8.0)

## 2021-06-21 LAB — AMMONIA: Ammonia: <10 umol/L (ref 9–35)

## 2021-06-21 LAB — VALPROIC ACID LEVEL: Valproic Acid Lvl: <10 ug/mL — ABNORMAL LOW (ref 50.0–100.0)

## 2021-06-21 LAB — TSH: TSH: 3.933 u[IU]/mL (ref 0.350–4.500)

## 2021-06-21 MED ORDER — OLANZAPINE 5 MG PO TABS
5.0000 mg | ORAL_TABLET | Freq: Two times a day (BID) | ORAL | Status: DC
  Administered 2021-06-21 – 2021-06-26 (×10): 5 mg via ORAL
  Filled 2021-06-21 (×10): qty 1

## 2021-06-21 MED ORDER — DIVALPROEX SODIUM 250 MG PO DR TAB
500.0000 mg | DELAYED_RELEASE_TABLET | Freq: Three times a day (TID) | ORAL | Status: DC
  Administered 2021-06-21 – 2021-06-26 (×13): 500 mg via ORAL
  Filled 2021-06-21 (×15): qty 2

## 2021-06-21 MED ORDER — CARBIDOPA-LEVODOPA 25-100 MG PO TABS
1.0000 | ORAL_TABLET | Freq: Three times a day (TID) | ORAL | Status: DC
  Administered 2021-06-21 – 2021-07-07 (×46): 1 via ORAL
  Filled 2021-06-21 (×49): qty 1

## 2021-06-21 MED ORDER — LEVOTHYROXINE SODIUM 75 MCG PO TABS
75.0000 ug | ORAL_TABLET | Freq: Every day | ORAL | Status: DC
  Administered 2021-06-22 – 2021-07-11 (×19): 75 ug via ORAL
  Filled 2021-06-21 (×20): qty 1

## 2021-06-21 MED ORDER — CARBIDOPA-LEVODOPA ER 25-100 MG PO TBCR: 1.0000 | EXTENDED_RELEASE_TABLET | Freq: Two times a day (BID) | ORAL | Status: DC

## 2021-06-21 MED ORDER — CARBIDOPA-LEVODOPA ER 25-100 MG PO TBCR: 1.0000 | EXTENDED_RELEASE_TABLET | Freq: Three times a day (TID) | ORAL | Status: DC

## 2021-06-21 NOTE — Procedures (Signed)
History: 76 yo M being evaluated for lethargy  Sedation: none  Technique: This EEG was acquired with electrodes placed according to the International 10-20 electrode system (including Fp1, Fp2, F3, F4, C3, C4, P3, P4, O1, O2, T3, T4, T5, T6, A1, A2, Fz, Cz, Pz). The following electrodes were missing or displaced: none.   Background: The background consists of intermixed alpha and beta activities. There is a well defined posterior dominant rhythm of 9 Hz that attenuates with eye opening. With drowsiness there is anterior shifting of the PDR, but sleep is not recorded.   Photic stimulation: Physiologic driving is not performed  EEG Abnormalities: none  Clinical Interpretation: This normal EEG is recorded in the waking and drowsy state. There was no seizure or seizure predisposition recorded on this study. Please note that lack of epileptiform activity on EEG does not preclude the possibility of epilepsy.   Ritta Slot, MD [facility] [phone]  If 7pm- 7am, please page neurology on call as listed in AMION.

## 2021-06-21 NOTE — Consult Note (Addendum)
° °  Trevor Cook is a 76 y.o. male that has a previous history of bipolar disorder, alcohol use disorder in remission since 1999, Wernicke Korsakoff, and stroke in 2021, who was brought to [facility] via nursing facility due to agitation, increased aggression, and suspected aspiration..   Pt admitted under IVC.  Pt recently discharged from [facility] after a 53-month length of stay.  Patient has also been discharged from multiple ALF facilities due to his aggression and agitation.  During this admission patient has been exhibiting sundowners, not sleeping, restless, psychomotor agitation, and increase in physical aggression which has resulted in restraints.    Patient attempted to be seen today, unable to be assessed as patient was unable to answer questions at this time.  His eyes do track, although he will not respond verbally. He does not appear to be in any acute distress at this time.    -Current diagnosis of dementia, Continue with safety sitter, as patient will require frequent re-orientation and redirection.  -Recommend initiating delirium precautions -Lithium level and valproate acid level undetected ;ammonia level obtained today is within normal limits. Will resume home medications to include valproic acid, olanzapine, and Sinemet.  We will continue to hold lithium at this time, due to suspected delirium. -Continue to limit use of restraints. -Will d/c Benadryl at this time.    Psychiatry will continue to follow at this time.

## 2021-06-21 NOTE — Assessment & Plan Note (Addendum)
Unsure of etiology. Possibly related to Cogentin. No focal findings. Patient with a listed history of epilepsy, not currently on antiepileptic drugs. No witnessed seizure-like activity, but it is possible he could have been post-ictal; EEG obtained without active evidence of seizure activity. Ammonia normal. Patient was found to be on Sinemet as an outpatient which was not restarted on admission. Cogentin discontinued. Lethargy resolved 2/17.

## 2021-06-21 NOTE — Plan of Care (Signed)
°  Problem: Education: Goal: Knowledge of General Education information will improve Description: Including pain rating scale, medication(s)/side effects and non-pharmacologic comfort measures 06/21/2021 2343 by Ronnald Collum, RN Outcome: Progressing 06/21/2021 2343 by Ronnald Collum, RN Outcome: Progressing   Problem: Health Behavior/Discharge Planning: Goal: Ability to manage health-related needs will improve 06/21/2021 2343 by Ronnald Collum, RN Outcome: Progressing 06/21/2021 2343 by Ronnald Collum, RN Outcome: Progressing   Problem: Clinical Measurements: Goal: Ability to maintain clinical measurements within normal limits will improve 06/21/2021 2343 by Ronnald Collum, RN Outcome: Progressing 06/21/2021 2343 by Ronnald Collum, RN Outcome: Progressing Goal: Will remain free from infection 06/21/2021 2343 by Ronnald Collum, RN Outcome: Progressing 06/21/2021 2343 by Ronnald Collum, RN Outcome: Progressing Goal: Diagnostic test results will improve 06/21/2021 2343 by Ronnald Collum, RN Outcome: Progressing 06/21/2021 2343 by Ronnald Collum, RN Outcome: Progressing Goal: Respiratory complications will improve 06/21/2021 2343 by Ronnald Collum, RN Outcome: Progressing 06/21/2021 2343 by Ronnald Collum, RN Outcome: Progressing Goal: Cardiovascular complication will be avoided 06/21/2021 2343 by Ronnald Collum, RN Outcome: Progressing 06/21/2021 2343 by Ronnald Collum, RN Outcome: Progressing   Problem: Activity: Goal: Risk for activity intolerance will decrease 06/21/2021 2343 by Ronnald Collum, RN Outcome: Progressing 06/21/2021 2343 by Ronnald Collum, RN Outcome: Progressing   Problem: Nutrition: Goal: Adequate nutrition will be maintained 06/21/2021 2343 by Ronnald Collum, RN Outcome: Progressing 06/21/2021 2343 by Ronnald Collum, RN Outcome: Progressing   Problem: Coping: Goal: Level of anxiety will decrease 06/21/2021 2343 by Ronnald Collum, RN Outcome:  Progressing 06/21/2021 2343 by Ronnald Collum, RN Outcome: Progressing   Problem: Elimination: Goal: Will not experience complications related to bowel motility 06/21/2021 2343 by Ronnald Collum, RN Outcome: Progressing 06/21/2021 2343 by Ronnald Collum, RN Outcome: Progressing Goal: Will not experience complications related to urinary retention 06/21/2021 2343 by Ronnald Collum, RN Outcome: Progressing 06/21/2021 2343 by Ronnald Collum, RN Outcome: Progressing   Problem: Pain Managment: Goal: General experience of comfort will improve 06/21/2021 2343 by Ronnald Collum, RN Outcome: Progressing 06/21/2021 2343 by Ronnald Collum, RN Outcome: Progressing   Problem: Safety: Goal: Ability to remain free from injury will improve 06/21/2021 2343 by Ronnald Collum, RN Outcome: Progressing 06/21/2021 2343 by Ronnald Collum, RN Outcome: Progressing   Problem: Skin Integrity: Goal: Risk for impaired skin integrity will decrease 06/21/2021 2343 by Ronnald Collum, RN Outcome: Progressing 06/21/2021 2343 by Ronnald Collum, RN Outcome: Progressing

## 2021-06-21 NOTE — Progress Notes (Signed)
EEG complete - results pending 

## 2021-06-21 NOTE — Progress Notes (Signed)
PROGRESS NOTE    Trevor Cook  WPY:[id number] DOB: 06/25/1945 DOA: 06/15/2021 PCP: Aida Puffer, MD   Brief Narrative: Randahl Abdul is a 76 y.o. male with a history of dementia, bipolar 1 disorder, epilepsy, CVA, hypothyroidism, Wernicke-Korsakoff syndrome. Patient presented secondary to agitation and aggression. While in the ED, he suffered an aspiration event and was admitted. Psychiatry consulted for discharge recommendations.   Assessment and Plan: * Aspiration into airway, initial encounter- (present on admission) No evidence of pneumonia clinically or on imaging. Aspiration of pills. Speech therapy consulted for recommendations. Started empirically on Unasyn. Per chart history, patient has a history of dysphagia. Unasyn discontinued. -SLP recommendations (2/14): Diet recommendations: Dysphagia 3 (mechanical soft);Thin liquid Liquids provided via: Cup;Straw Medication Administration: Whole meds with puree Supervision: Staff to assist with self feeding Compensations: Slow rate;Small sips/bites;Minimize environmental distractions (check for right sided pocketing, pt eats at rapid rate - assure slow rate please) Postural Changes and/or Swallow Maneuvers: Seated upright 90 degrees;Upright 30-60 min after meal  Lethargy Unsure of etiology. Possibly related to Cogentin. No focal findings. Patient with a listed history of epilepsy, not currently on antiepileptic drugs. No witnessed seizure-like activity, but it is possible he could be post-ictal. Ammonia obtained this morning was within normal limits. Patient was found to be on Sinemet as an outpatient which was not restarted. Possible this could be contributing to current presentation. -EEG -Discontinue Cogentin -Restart Sinemet  History of CVA (cerebrovascular accident) Not on antiplatelet therapy. Patient possibly with resultant dysphagia. Noted.  Dementia with behavioral disturbance Complicated by underlying diagnosis of  Wernicke's disease. Patient recently required hospitalization secondary to behavioral disorder. It appears patient was discharged on Depakote 500 mg TID, Zyprexa 5 mg BID, Zyprexa 2.5 mg BID PRN agitation and Cogentin 0.5 mg BID -Psychiatry consulted and recommendations are pending  Wernicke's disease Patient with recent prolonged hospitalization. Secondary to prolonged alcohol use. 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 Stable. Per recent discharge summary, it appears patient was discharged on carbidopa-levodopa 25-100 mg TID -Resume Sinemet  History of alcohol abuse- (present on admission) No longed drinking alcohol per son.  Hypothyroidism- (present on admission) -Continue Synthroid; increase to updated dose of 75 mcg daily     DVT prophylaxis: Lovenox Code Status:   Code Status: DNR Family Communication: None at bedside Disposition Plan: Discharge pending psychiatry recommendations   Consultants:  Psychiatry  Procedures:  None  Antimicrobials: Unasyn    Subjective: No concerns per patient  Objective: Vitals:   06/20/21 0327 06/20/21 1218 06/20/21 2210 06/21/21 1334  BP: (!) 149/93 (!) 142/120 (!) 122/97 (!) 143/55  Pulse: 84 67 97 (!) 55  Resp: 20 16 20 20   Temp: 97.9 F (36.6 C) 98.6 F (37 C) 98.5 F (36.9 C) 97.9 F (36.6 C)  TempSrc: Axillary Oral Oral Oral  SpO2: 97% 97% 92% 99%    Intake/Output Summary (Last 24 hours) at 06/21/2021 1533 Last data filed at 06/21/2021 1229 Gross per 24 hour  Intake 720 ml  Output --  Net 720 ml    There were no vitals filed for this visit.  Examination:  General exam: Appears calm and comfortable Respiratory system: Clear to auscultation. Respiratory effort normal. Cardiovascular system: S1 & S2 heard, RRR. No murmurs, rubs, gallops or clicks. Gastrointestinal system: Abdomen is nondistended, soft and nontender. No organomegaly or masses felt. Normal bowel  sounds heard. Central nervous system: Lethargic but arouses to persistent tactile stimuli. Right arm tremor worsens when more alert.  Musculoskeletal: No edema. No calf tenderness Skin: No cyanosis. No rashes    Data Reviewed: I have personally reviewed following labs and imaging studies  CBC Lab Results  Component Value Date   WBC 7.9 06/20/2021   RBC 3.69 (L) 06/20/2021   HGB 12.0 (L) 06/20/2021   HCT 37.1 (L) 06/20/2021   MCV 100.5 (H) 06/20/2021   MCH 32.5 06/20/2021   PLT 212 06/20/2021   MCHC 32.3 06/20/2021   RDW 12.2 06/20/2021   LYMPHSABS 0.8 06/18/2021   MONOABS 0.7 06/18/2021   EOSABS 0.1 06/18/2021   BASOSABS 0.0 06/18/2021     Last metabolic panel Lab Results  Component Value Date   NA 140 06/20/2021   K 4.5 06/20/2021   CL 105 06/20/2021   CO2 29 06/20/2021   BUN 20 06/20/2021   CREATININE 0.66 06/20/2021   GLUCOSE 94 06/20/2021   GFRNONAA >60 06/20/2021   GFRAA 100 04/07/2020   CALCIUM 8.9 06/20/2021   PHOS 3.4 06/18/2021   PROT 6.0 (L) 06/20/2021   ALBUMIN 3.6 06/20/2021   LABGLOB 2.3 04/07/2020   AGRATIO 1.8 04/07/2020   BILITOT 0.2 (L) 06/20/2021   ALKPHOS 49 06/20/2021   AST 24 06/20/2021   ALT 23 06/20/2021   ANIONGAP 6 06/20/2021    CBG (last 3)  No results for input(s): GLUCAP in the last 72 hours.   GFR: CrCl cannot be calculated (Unknown ideal weight.).  Coagulation Profile: No results for input(s): INR, PROTIME in the last 168 hours.  Recent Results (from the past 240 hour(s))  Resp Panel by RT-PCR (Flu A&B, Covid) Nasopharyngeal Swab     Status: None   Collection Time: 06/15/21  2:37 PM   Specimen: Nasopharyngeal Swab; Nasopharyngeal(NP) swabs in vial transport medium  Result Value Ref Range Status   SARS Coronavirus 2 by RT PCR NEGATIVE NEGATIVE Final    Comment: (NOTE) SARS-CoV-2 target nucleic acids are NOT DETECTED.  The SARS-CoV-2 RNA is generally detectable in upper respiratory specimens during the acute phase of  infection. The lowest concentration of SARS-CoV-2 viral copies this assay can detect is 138 copies/mL. A negative result does not preclude SARS-Cov-2 infection and should not be used as the sole basis for treatment or other patient management decisions. A negative result may occur with  improper specimen collection/handling, submission of specimen other than nasopharyngeal swab, presence of viral mutation(s) within the areas targeted by this assay, and inadequate number of viral copies(<138 copies/mL). A negative result must be combined with clinical observations, patient history, and epidemiological information. The expected result is Negative.  Fact Sheet for Patients:  BloggerCourse.comhttps://www.fda.gov/media/152166/download  Fact Sheet for Healthcare Providers:  SeriousBroker.ithttps://www.fda.gov/media/152162/download  This test is no t yet approved or cleared by the [place] FDA and  has been authorized for detection and/or diagnosis of SARS-CoV-2 by FDA under an Emergency Use Authorization (EUA). This EUA will remain  in effect (meaning this test can be used) for the duration of the COVID-19 declaration under Section 564(b)(1) of the Act, 21 U.S.C.section 360bbb-3(b)(1), unless the authorization is terminated  or revoked sooner.       Influenza A by PCR NEGATIVE NEGATIVE Final   Influenza B by PCR NEGATIVE NEGATIVE Final    Comment: (NOTE) The Xpert Xpress SARS-CoV-2/FLU/RSV plus assay is intended as an aid in the diagnosis of influenza from Nasopharyngeal swab specimens and should not be used as a sole basis for treatment. Nasal washings and aspirates are unacceptable for Xpert Xpress SARS-CoV-2/FLU/RSV testing.  Fact Sheet for  Patients: BloggerCourse.com  Fact Sheet for Healthcare Providers: SeriousBroker.it  This test is not yet approved or cleared by the [place] FDA and has been authorized for detection and/or diagnosis of SARS-CoV-2  by FDA under an Emergency Use Authorization (EUA). This EUA will remain in effect (meaning this test can be used) for the duration of the COVID-19 declaration under Section 564(b)(1) of the Act, 21 U.S.C. section 360bbb-3(b)(1), unless the authorization is terminated or revoked.  Performed at [facility], [address] [place], [place] [address]   Resp Panel by RT-PCR (Flu A&B, Covid)     Status: None   Collection Time: 06/18/21  7:34 AM   Specimen: Nasopharyngeal(NP) swabs in vial transport medium  Result Value Ref Range Status   SARS Coronavirus 2 by RT PCR NEGATIVE NEGATIVE Final    Comment: (NOTE) SARS-CoV-2 target nucleic acids are NOT DETECTED.  The SARS-CoV-2 RNA is generally detectable in upper respiratory specimens during the acute phase of infection. The lowest concentration of SARS-CoV-2 viral copies this assay can detect is 138 copies/mL. A negative result does not preclude SARS-Cov-2 infection and should not be used as the sole basis for treatment or other patient management decisions. A negative result may occur with  improper specimen collection/handling, submission of specimen other than nasopharyngeal swab, presence of viral mutation(s) within the areas targeted by this assay, and inadequate number of viral copies(<138 copies/mL). A negative result must be combined with clinical observations, patient history, and epidemiological information. The expected result is Negative.  Fact Sheet for Patients:  BloggerCourse.com  Fact Sheet for Healthcare Providers:  SeriousBroker.it  This test is no t yet approved or cleared by the [place] FDA and  has been authorized for detection and/or diagnosis of SARS-CoV-2 by FDA under an Emergency Use Authorization (EUA). This EUA will remain  in effect (meaning this test can be used) for the duration of the COVID-19 declaration under Section 564(b)(1)  of the Act, 21 U.S.C.section 360bbb-3(b)(1), unless the authorization is terminated  or revoked sooner.       Influenza A by PCR NEGATIVE NEGATIVE Final   Influenza B by PCR NEGATIVE NEGATIVE Final    Comment: (NOTE) The Xpert Xpress SARS-CoV-2/FLU/RSV plus assay is intended as an aid in the diagnosis of influenza from Nasopharyngeal swab specimens and should not be used as a sole basis for treatment. Nasal washings and aspirates are unacceptable for Xpert Xpress SARS-CoV-2/FLU/RSV testing.  Fact Sheet for Patients: BloggerCourse.com  Fact Sheet for Healthcare Providers: SeriousBroker.it  This test is not yet approved or cleared by the [place] FDA and has been authorized for detection and/or diagnosis of SARS-CoV-2 by FDA under an Emergency Use Authorization (EUA). This EUA will remain in effect (meaning this test can be used) for the duration of the COVID-19 declaration under Section 564(b)(1) of the Act, 21 U.S.C. section 360bbb-3(b)(1), unless the authorization is terminated or revoked.  Performed at [facility], [address] [place], [place] [address]   Culture, blood (routine x 2)     Status: None (Preliminary result)   Collection Time: 06/18/21  7:34 AM   Specimen: BLOOD  Result Value Ref Range Status   Specimen Description   Final    BLOOD LEFT ANTECUBITAL Performed at [facility], [address] [place], [place] [address]    Special Requests   Final    BOTTLES DRAWN AEROBIC AND ANAEROBIC Blood Culture adequate volume Performed at [facility], [address] [place], [place] [address]    Culture   Final    NO GROWTH 3 DAYS Performed at [facility], [address] [place], [place] [address]    Report Status PENDING  Incomplete  Culture, blood (routine x 2)     Status: None (Preliminary result)   Collection Time: 06/18/21  7:34 AM    Specimen: BLOOD  Result Value Ref Range Status   Specimen Description   Final    BLOOD RIGHT ANTECUBITAL Performed at [facility], [address] [place], [place] [address]    Special Requests   Final    BOTTLES DRAWN AEROBIC AND ANAEROBIC Blood Culture adequate volume Performed at [facility], [address] [place], [place] [address]    Culture   Final    NO GROWTH 3 DAYS Performed at [facility], [address] [place], [place] [address]    Report Status PENDING  Incomplete        Radiology Studies: No results found.      Scheduled Meds:  Carbidopa-Levodopa ER  1 tablet Oral TID   enoxaparin (LOVENOX) injection  40 mg Subcutaneous Q24H   [START ON 06/22/2021] levothyroxine  75 mcg Oral Q0600   memantine  5 mg Oral QHS   multivitamin with minerals  1 tablet Oral Daily   thiamine  100 mg Oral Daily   Continuous Infusions:   LOS: 1 day     Jacquelin Hawking, MD [facility] 06/21/2021, 3:33 PM  If 7PM-7AM, please contact night-coverage [email or web address]

## 2021-06-21 NOTE — TOC Progression Note (Signed)
Transition of Care [facility]) - Progression Note    Patient Details  Name: Trevor Cook MRN: [id number] Date of Birth: January 15, 1946  Transition of Care [facility]) CM/SW Contact  Ida Rogue, [place] Phone Number: 06/21/2021, 9:29 AM  Clinical Narrative:   Spoke with son for additional information. Patient was living with him for many years, doing well on medications until stroke in late 2021. At that point, his personality began to change and he became a lot more irritated and confrontational. PCP was working on getting medications dialed in, Trevor Cook ended up in hospital and then in SNF for a couple fo months.  From there he went to [facility] where he struggled, ended up escaping one night and ended up at [place], which brings [place] to current situation. Son states that he appeared at baseline before going to [facility], but once his father realized that this was his new home and he would not be living with his son, he became angry and acted out. Son believes he needs to return to [place] for continued care, and from there be transferred to a confined setting. Son states that in 1999, his father was exhibiting symptoms of paranoia and AH, and he has seen evidence of their return. TOC will continue to follow during the course of hospitalization.     Expected Discharge Plan:  (TBD) Barriers to Discharge: Continued Medical Work up  Expected Discharge Plan and Services Expected Discharge Plan:  (TBD)       Living arrangements for the past 2 months:  ([facility])                                       Social Determinants of Health (SDOH) Interventions    Readmission Risk Interventions No flowsheet data found.

## 2021-06-22 DIAGNOSIS — T17908A Unspecified foreign body in respiratory tract, part unspecified causing other injury, initial encounter: Secondary | ICD-10-CM | POA: Diagnosis not present

## 2021-06-22 DIAGNOSIS — Z8673 Personal history of transient ischemic attack (TIA), and cerebral infarction without residual deficits: Secondary | ICD-10-CM | POA: Diagnosis not present

## 2021-06-22 DIAGNOSIS — I10 Essential (primary) hypertension: Secondary | ICD-10-CM | POA: Diagnosis not present

## 2021-06-22 DIAGNOSIS — F03918 Unspecified dementia, unspecified severity, with other behavioral disturbance: Secondary | ICD-10-CM | POA: Diagnosis not present

## 2021-06-22 MED ORDER — STERILE WATER FOR INJECTION IJ SOLN
INTRAMUSCULAR | Status: AC
  Administered 2021-06-22: 2.1 mL
  Filled 2021-06-22: qty 10

## 2021-06-22 MED ORDER — OLANZAPINE 10 MG IM SOLR
2.5000 mg | Freq: Once | INTRAMUSCULAR | Status: AC | PRN
  Administered 2021-06-22: 2.5 mg via INTRAMUSCULAR
  Filled 2021-06-22: qty 10

## 2021-06-22 NOTE — Consult Note (Signed)
Bejamin Cook is a 76 y.o. male that has a previous history of bipolar disorder, alcohol use disorder in remission since 1999, Wernicke Korsakoff, and stroke in 2021, who was brought to [facility] via nursing facility due to agitation, increased aggression, and suspected aspiration.  Pt admitted under IVC.  Pt recently discharged from [facility] after a 72-month length of stay.  Patient has also been discharged from multiple ALF facilities due to his aggression and agitation. Also informed that patient has been displaced from [facility] after (2day stay) for agitation and aggression.  During this admission patient has been exhibiting sundowners, not sleeping, restless, psychomotor agitation, and increase in physical aggression which has resulted in restraints.    Patient has history of worsening cognitive impairment, wandering behaviors, and decrease in mental status. Patient is verbal on today's exam, however very difficult to understand due to residual speech deficits following stroke.  Patient presented today as calm, cooperative, easily redirectable, and pleasant. Patient even smiled x 2 during this assessment.  Patient denies any previous psychiatric diagnosis, suicide attempts, and or inpatient admission.  On evaluation he also denies current suicidal ideation, suicidal thoughts, and or suicidal gestures.  He is able to contract for safety.   He does provide consent to this writer to obtain collateral information from his son.  Patients current presentation consistent with known diagnoses of Dementia with behavioral problem, and Wernicke -Korsakoff syndrome rather than psychiatric decompensation. Historically patient has increase in behavioral outburst when Depakote levels are decreased. Current Depakote levels obtained Wednesday were not within therapeutic range <10. Furthermore due to significant dementia unlikely to benefit from milieu therapy following 82 length of stay at  [facility]. Will work towards restarting previous effective medication to prevent mood cycling which would worsen behaviors while inpatient.   Patient was seen for reassessment. Patient is observed to be ambulating to the bed without assistance after leaving the restroom. He is observed talking to staff, and conversation appears to be appropriate. He is fairly groomed and appears to have been bathed. His mood is euthymic and his affect is congruent and appropriate. Writer was able to gain his attention and he is easily redirected.  Patient appears to be improving from when he originally presented to the ER. Per chart review he continues to have restless at time, required IM medication this morning as he was swinging at staff(reports from MD, no documentation noted from nursing).  However he does improve with hands on care, ambulation and talking to the patient. Patient may benefit from memory care/assisted living facility.  Will psych clear at this time, it is encouraged that we continue current medications and safety measures.   Discussed with his staff that he may experience some confusion, agitation, and restlessness as well as Sundowners this is completely normal behavior in a person of this age. It is felt that patient may need higher level of care due to worsening memory impairment, confusion, wandering behaviors, and restlessness.  Patient behaviors are consistent with a person with dementia and other cognitive impairment. Memory care unit,  ALF, Home Health unit would be appropriate as they are well equipped to help elderly persons with memory loss, maintain cognitive skills and help with quality of life. Patient is able to take care of self, however needs more social interaction and person centered care to help with worsening memory impairment and difficult dementia behaviors.    -Continue current medications, if patient receives prn medication recommend documenting reasons for prn  medication given and progress note. There is no documentation consistent with physical restraint of patient to administer IM medications.  -Continue with safety sitter, as patient will require frequent re-orientation and redirection.  -Recommend continuing delirium precautions -Continue to limit use of restraints. -Son is available this weekend for collateral.  -Will recommend memory care unit or higher level of care at this time, due to his severe dementia and cognitive impairments at baseline.   Psychiatry will continue to follow from a distance, until Depakote levels are therapeutic.

## 2021-06-22 NOTE — Progress Notes (Signed)
PROGRESS NOTE    Trevor Cook  NOM:[id number] DOB: 01-28-1946 DOA: 06/15/2021 PCP: Aida Puffer, MD   Brief Narrative: Trevor Cook is a 76 y.o. male with a history of dementia, bipolar 1 disorder, epilepsy, CVA, hypothyroidism, Wernicke-Korsakoff syndrome. Patient presented secondary to agitation and aggression. While in the ED, he suffered an aspiration event and was admitted. Psychiatry consulted for discharge recommendations.   Assessment and Plan: * Aspiration into airway, initial encounter- (present on admission) No evidence of pneumonia clinically or on imaging. Aspiration of pills. Speech therapy consulted for recommendations. Started empirically on Unasyn. Per chart history, patient has a history of dysphagia. Unasyn discontinued. -SLP recommendations (2/14): Diet recommendations: Dysphagia 3 (mechanical soft);Thin liquid Liquids provided via: Cup;Straw Medication Administration: Whole meds with puree Supervision: Staff to assist with self feeding Compensations: Slow rate;Small sips/bites;Minimize environmental distractions (check for right sided pocketing, pt eats at rapid rate - assure slow rate please) Postural Changes and/or Swallow Maneuvers: Seated upright 90 degrees;Upright 30-60 min after meal  Lethargy Unsure of etiology. Possibly related to Cogentin. No focal findings. Patient with a listed history of epilepsy, not currently on antiepileptic drugs. No witnessed seizure-like activity, but it is possible he could have been post-ictal; EEG obtained without active evidence of seizure activity. Ammonia normal. Patient was found to be on Sinemet as an outpatient which was not restarted on admission. Cogentin discontinued. Lethargy resolved 2/17.  History of CVA (cerebrovascular accident) Not on antiplatelet therapy. Patient possibly with resultant dysphagia. Noted.  Dementia with behavioral disturbance Complicated by underlying diagnosis of Wernicke's disease. Patient  recently required hospitalization secondary to behavioral disorder. It appears patient was discharged on Depakote 500 mg TID, Zyprexa 5 mg BID, Zyprexa 2.5 mg BID PRN agitation and Cogentin 0.5 mg BID -Psychiatry recommendations: Valproic acid, Zyprexa. Holding lithium. Limit use of restraints. -Delirium precautions  Wernicke's disease Patient with recent prolonged hospitalization. Secondary to prolonged alcohol use. 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 Stable. Per recent discharge summary, it appears patient was discharged on carbidopa-levodopa 25-100 mg TID -Continue Sinemet  History of alcohol abuse- (present on admission) No longed drinking alcohol per son.  Hypothyroidism- (present on admission) -Continue Synthroid 75 mcg daily     DVT prophylaxis: Lovenox Code Status:   Code Status: DNR Family Communication: Son at bedside Disposition Plan: Discharge pending psychiatry recommendations in addition to placement options   Consultants:  Psychiatry  Procedures:  None  Antimicrobials: Unasyn    Subjective: No issues per patient. This morning, per nursing patient had significant agitation and combativeness. Zyprexa IM given for treatment.  Objective: Vitals:   06/20/21 2210 06/21/21 1334 06/21/21 1949 06/22/21 0533  BP: (!) 122/97 (!) 143/55 (!) 109/56 (!) 153/81  Pulse: 97 (!) 55 (!) 58 62  Resp: 20 20 16 16   Temp: 98.5 F (36.9 C) 97.9 F (36.6 C)  97.9 F (36.6 C)  TempSrc: Oral Oral  Oral  SpO2: 92% 99% 97% 97%    Intake/Output Summary (Last 24 hours) at 06/22/2021 1241 Last data filed at 06/22/2021 1000 Gross per 24 hour  Intake 1941 ml  Output 1900 ml  Net 41 ml    There were no vitals filed for this visit.  Examination:  General exam: Appears calm and comfortable Respiratory system: Clear to auscultation. Respiratory effort normal. Cardiovascular system: S1 & S2 heard, RRR. No murmurs, rubs,  gallops or clicks. Gastrointestinal system: Abdomen is nondistended, soft and nontender. No organomegaly or masses felt. Normal bowel sounds  heard. Central nervous system: Alert and oriented to person. Right UE tremor. Musculoskeletal:  No calf tenderness Skin: No cyanosis. No rashes    Data Reviewed: I have personally reviewed following labs and imaging studies  CBC Lab Results  Component Value Date   WBC 7.9 06/20/2021   RBC 3.69 (L) 06/20/2021   HGB 12.0 (L) 06/20/2021   HCT 37.1 (L) 06/20/2021   MCV 100.5 (H) 06/20/2021   MCH 32.5 06/20/2021   PLT 212 06/20/2021   MCHC 32.3 06/20/2021   RDW 12.2 06/20/2021   LYMPHSABS 0.8 06/18/2021   MONOABS 0.7 06/18/2021   EOSABS 0.1 06/18/2021   BASOSABS 0.0 06/18/2021     Last metabolic panel Lab Results  Component Value Date   NA 140 06/20/2021   K 4.5 06/20/2021   CL 105 06/20/2021   CO2 29 06/20/2021   BUN 20 06/20/2021   CREATININE 0.66 06/20/2021   GLUCOSE 94 06/20/2021   GFRNONAA >60 06/20/2021   GFRAA 100 04/07/2020   CALCIUM 8.9 06/20/2021   PHOS 3.4 06/18/2021   PROT 6.0 (L) 06/20/2021   ALBUMIN 3.6 06/20/2021   LABGLOB 2.3 04/07/2020   AGRATIO 1.8 04/07/2020   BILITOT 0.2 (L) 06/20/2021   ALKPHOS 49 06/20/2021   AST 24 06/20/2021   ALT 23 06/20/2021   ANIONGAP 6 06/20/2021    CBG (last 3)  No results for input(s): GLUCAP in the last 72 hours.   GFR: CrCl cannot be calculated (Unknown ideal weight.).  Coagulation Profile: No results for input(s): INR, PROTIME in the last 168 hours.  Recent Results (from the past 240 hour(s))  Resp Panel by RT-PCR (Flu A&B, Covid) Nasopharyngeal Swab     Status: None   Collection Time: 06/15/21  2:37 PM   Specimen: Nasopharyngeal Swab; Nasopharyngeal(NP) swabs in vial transport medium  Result Value Ref Range Status   SARS Coronavirus 2 by RT PCR NEGATIVE NEGATIVE Final    Comment: (NOTE) SARS-CoV-2 target nucleic acids are NOT DETECTED.  The SARS-CoV-2 RNA is  generally detectable in upper respiratory specimens during the acute phase of infection. The lowest concentration of SARS-CoV-2 viral copies this assay can detect is 138 copies/mL. A negative result does not preclude SARS-Cov-2 infection and should not be used as the sole basis for treatment or other patient management decisions. A negative result may occur with  improper specimen collection/handling, submission of specimen other than nasopharyngeal swab, presence of viral mutation(s) within the areas targeted by this assay, and inadequate number of viral copies(<138 copies/mL). A negative result must be combined with clinical observations, patient history, and epidemiological information. The expected result is Negative.  Fact Sheet for Patients:  BloggerCourse.com  Fact Sheet for Healthcare Providers:  SeriousBroker.it  This test is no t yet approved or cleared by the [place] FDA and  has been authorized for detection and/or diagnosis of SARS-CoV-2 by FDA under an Emergency Use Authorization (EUA). This EUA will remain  in effect (meaning this test can be used) for the duration of the COVID-19 declaration under Section 564(b)(1) of the Act, 21 U.S.C.section 360bbb-3(b)(1), unless the authorization is terminated  or revoked sooner.       Influenza A by PCR NEGATIVE NEGATIVE Final   Influenza B by PCR NEGATIVE NEGATIVE Final    Comment: (NOTE) The Xpert Xpress SARS-CoV-2/FLU/RSV plus assay is intended as an aid in the diagnosis of influenza from Nasopharyngeal swab specimens and should not be used as a sole basis for treatment. Nasal washings and aspirates  are unacceptable for Xpert Xpress SARS-CoV-2/FLU/RSV testing.  Fact Sheet for Patients: BloggerCourse.com  Fact Sheet for Healthcare Providers: SeriousBroker.it  This test is not yet approved or cleared by the [place] FDA and has been authorized for detection and/or diagnosis of SARS-CoV-2 by FDA under an Emergency Use Authorization (EUA). This EUA will remain in effect (meaning this test can be used) for the duration of the COVID-19 declaration under Section 564(b)(1) of the Act, 21 U.S.C. section 360bbb-3(b)(1), unless the authorization is terminated or revoked.  Performed at [facility], [address], [place], [place] [address]   Resp Panel by RT-PCR (Flu A&B, Covid)     Status: None   Collection Time: 06/18/21  7:34 AM   Specimen: Nasopharyngeal(NP) swabs in vial transport medium  Result Value Ref Range Status   SARS Coronavirus 2 by RT PCR NEGATIVE NEGATIVE Final    Comment: (NOTE) SARS-CoV-2 target nucleic acids are NOT DETECTED.  The SARS-CoV-2 RNA is generally detectable in upper respiratory specimens during the acute phase of infection. The lowest concentration of SARS-CoV-2 viral copies this assay can detect is 138 copies/mL. A negative result does not preclude SARS-Cov-2 infection and should not be used as the sole basis for treatment or other patient management decisions. A negative result may occur with  improper specimen collection/handling, submission of specimen other than nasopharyngeal swab, presence of viral mutation(s) within the areas targeted by this assay, and inadequate number of viral copies(<138 copies/mL). A negative result must be combined with clinical observations, patient history, and epidemiological information. The expected result is Negative.  Fact Sheet for Patients:  BloggerCourse.com  Fact Sheet for Healthcare Providers:  SeriousBroker.it  This test is no t yet approved or cleared by the [place] FDA and  has been authorized for detection and/or diagnosis of SARS-CoV-2 by FDA under an Emergency Use Authorization (EUA). This EUA will remain  in effect (meaning this test  can be used) for the duration of the COVID-19 declaration under Section 564(b)(1) of the Act, 21 U.S.C.section 360bbb-3(b)(1), unless the authorization is terminated  or revoked sooner.       Influenza A by PCR NEGATIVE NEGATIVE Final   Influenza B by PCR NEGATIVE NEGATIVE Final    Comment: (NOTE) The Xpert Xpress SARS-CoV-2/FLU/RSV plus assay is intended as an aid in the diagnosis of influenza from Nasopharyngeal swab specimens and should not be used as a sole basis for treatment. Nasal washings and aspirates are unacceptable for Xpert Xpress SARS-CoV-2/FLU/RSV testing.  Fact Sheet for Patients: BloggerCourse.com  Fact Sheet for Healthcare Providers: SeriousBroker.it  This test is not yet approved or cleared by the [place] FDA and has been authorized for detection and/or diagnosis of SARS-CoV-2 by FDA under an Emergency Use Authorization (EUA). This EUA will remain in effect (meaning this test can be used) for the duration of the COVID-19 declaration under Section 564(b)(1) of the Act, 21 U.S.C. section 360bbb-3(b)(1), unless the authorization is terminated or revoked.  Performed at [facility], [address] [place], [place] [address]   Culture, blood (routine x 2)     Status: None (Preliminary result)   Collection Time: 06/18/21  7:34 AM   Specimen: BLOOD  Result Value Ref Range Status   Specimen Description   Final    BLOOD LEFT ANTECUBITAL Performed at [facility], [address], [place], [place] [address]    Special Requests   Final    BOTTLES DRAWN AEROBIC AND ANAEROBIC  Blood Culture adequate volume Performed at [facility], [address] [place], [place] [address]    Culture   Final    NO GROWTH 4 DAYS Performed at [facility], [address] [place], [place] [address]    Report Status PENDING  Incomplete  Culture, blood (routine x  2)     Status: None (Preliminary result)   Collection Time: 06/18/21  7:34 AM   Specimen: BLOOD  Result Value Ref Range Status   Specimen Description   Final    BLOOD RIGHT ANTECUBITAL Performed at [facility], [address] [place], [place] [address]    Special Requests   Final    BOTTLES DRAWN AEROBIC AND ANAEROBIC Blood Culture adequate volume Performed at [facility], [address] [place], [place] [address]    Culture   Final    NO GROWTH 4 DAYS Performed at [facility], [address] [place], [place] [address]    Report Status PENDING  Incomplete        Radiology Studies: EEG adult  Result Date: 06/21/2021 Rejeana BrockKirkpatrick, McNeill P, MD     06/21/2021  6:19 PM History: 76 yo M being evaluated for lethargy Sedation: none Technique: This EEG was acquired with electrodes placed according to the International 10-20 electrode system (including Fp1, Fp2, F3, F4, C3, C4, P3, P4, O1, O2, T3, T4, T5, T6, A1, A2, Fz, Cz, Pz). The following electrodes were missing or displaced: none. Background: The background consists of intermixed alpha and beta activities. There is a well defined posterior dominant rhythm of 9 Hz that attenuates with eye opening. With drowsiness there is anterior shifting of the PDR, but sleep is not recorded. Photic stimulation: Physiologic driving is not performed EEG Abnormalities: none Clinical Interpretation: This normal EEG is recorded in the waking and drowsy state. There was no seizure or seizure predisposition recorded on this study. Please note that lack of epileptiform activity on EEG does not preclude the possibility of epilepsy. Ritta SlotMcNeill Kirkpatrick, MD [facility] [phone] If 7pm- 7am, please page neurology on call as listed in AMION.        Scheduled Meds:  carbidopa-levodopa  1 tablet Oral TID   divalproex  500 mg Oral TID   enoxaparin (LOVENOX) injection  40 mg Subcutaneous Q24H    levothyroxine  75 mcg Oral Q0600   memantine  5 mg Oral QHS   multivitamin with minerals  1 tablet Oral Daily   OLANZapine  5 mg Oral BID   thiamine  100 mg Oral Daily   Continuous Infusions:   LOS: 2 days     Jacquelin Hawkingalph Ahilyn Nell, MD [facility] 06/22/2021, 12:41 PM  If 7PM-7AM, please contact night-coverage [email or web address]

## 2021-06-23 DIAGNOSIS — I10 Essential (primary) hypertension: Secondary | ICD-10-CM | POA: Diagnosis not present

## 2021-06-23 DIAGNOSIS — T17908A Unspecified foreign body in respiratory tract, part unspecified causing other injury, initial encounter: Secondary | ICD-10-CM | POA: Diagnosis not present

## 2021-06-23 DIAGNOSIS — Z8673 Personal history of transient ischemic attack (TIA), and cerebral infarction without residual deficits: Secondary | ICD-10-CM | POA: Diagnosis not present

## 2021-06-23 DIAGNOSIS — F03918 Unspecified dementia, unspecified severity, with other behavioral disturbance: Secondary | ICD-10-CM | POA: Diagnosis not present

## 2021-06-23 LAB — CULTURE, BLOOD (ROUTINE X 2)
Culture: NO GROWTH
Culture: NO GROWTH
Special Requests: ADEQUATE
Special Requests: ADEQUATE

## 2021-06-23 LAB — GLUCOSE, CAPILLARY: Glucose-Capillary: 103 mg/dL — ABNORMAL HIGH (ref 70–99)

## 2021-06-23 MED ORDER — OLANZAPINE 10 MG IM SOLR
5.0000 mg | Freq: Once | INTRAMUSCULAR | Status: AC
  Administered 2021-06-23: 5 mg via INTRAMUSCULAR
  Filled 2021-06-23: qty 10

## 2021-06-23 MED ORDER — ORAL CARE MOUTH RINSE
15.0000 mL | Freq: Two times a day (BID) | OROMUCOSAL | Status: DC
  Administered 2021-06-24 – 2021-07-09 (×26): 15 mL via OROMUCOSAL
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
## 2021-06-23 NOTE — Progress Notes (Signed)
Son Jenny Reichmann) called via telephone by this RN. Son educated on purpose of restraints and son verbalized understanding.

## 2021-06-23 NOTE — Progress Notes (Signed)
Late Entry Note:  06/21/21: throughout the night pt was pleasantly interacting with staff, oriented, & following commands. Pt requested to have a conversation with his son, RN placed phone call to pts son.  06/22/21: around 6:50 am pt began to exit the bed. Sitter at bedside stated he attempted to help assist patient asking him if he needed to use the bathroom. Pt is a high fall risk and very unsteady on his feet. Sitter states pt begins walking towards the room door. Pt states "I dont have to use the bathroom I am looking for my bowl." Sitter states he attempted to reorient the patient.  Sitter states he is standing beside the pt.  Sitter states pt begans loudly mumbling incomprehensible words, and starts shaking his arms. Sitter states he continues to attempt to reorient the patient, attempting to figure out what his needs are at the moment and how he can assist him.  Sitter states patient shoves him out of the way as he continues to head towards the room door, attempting to leave the room. NT overheard the commotion and provided assistance, attempting to reorient the patient and help assist him back into his room. Pt is verbally & physically aggressive punching & swinging at staff. Consulting civil engineer, Comptroller, & NT assist pt back to bed. Pt is loudly cursing, & continuing to swing, hit & kick at staff.  For the safety of staff, patient, & others staff had to briefly hold patients arms down, while continuing to reassure pt.  RN enters the room and attempts to reorient & reassure patient he is safe & that everything is alright. RN lets patient know he is in the hospital & that his son is coming to visit today. RN attempted to ask patient if he would like to speak with his son, pt says NO as he is continuously fighting & kicking staff. NP is notified and order for IM Zyprexa was received & administered.
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
## 2021-06-23 NOTE — Progress Notes (Signed)
PROGRESS NOTE    Trevor Cook  VEL:[id number] DOB: 1945/06/12 DOA: 06/15/2021 PCP: Aida Puffer, MD   Brief Narrative: Trevor Cook is a 76 y.o. male with a history of dementia, bipolar 1 disorder, epilepsy, CVA, hypothyroidism, Wernicke-Korsakoff syndrome. Patient presented secondary to agitation and aggression. While in the ED, he suffered an aspiration event and was admitted. Psychiatry consulted for discharge recommendations.   Assessment and Plan: * Aspiration into airway, initial encounter- (present on admission) No evidence of pneumonia clinically or on imaging. Aspiration of pills. Speech therapy consulted for recommendations. Started empirically on Unasyn. Per chart history, patient has a history of dysphagia. Unasyn discontinued. -SLP recommendations (2/14): Diet recommendations: Dysphagia 3 (mechanical soft);Thin liquid Liquids provided via: Cup;Straw Medication Administration: Whole meds with puree Supervision: Staff to assist with self feeding Compensations: Slow rate;Small sips/bites;Minimize environmental distractions (check for right sided pocketing, pt eats at rapid rate - assure slow rate please) Postural Changes and/or Swallow Maneuvers: Seated upright 90 degrees;Upright 30-60 min after meal  Lethargy Unsure of etiology. Possibly related to Cogentin. No focal findings. Patient with a listed history of epilepsy, not currently on antiepileptic drugs. No witnessed seizure-like activity, but it is possible he could have been post-ictal; EEG obtained without active evidence of seizure activity. Ammonia normal. Patient was found to be on Sinemet as an outpatient which was not restarted on admission. Cogentin discontinued. Lethargy resolved 2/17.  History of CVA (cerebrovascular accident) Not on antiplatelet therapy. Patient possibly with resultant dysphagia. Noted.  Dementia with behavioral disturbance Complicated by underlying diagnosis of Wernicke's disease. Patient  recently required hospitalization secondary to behavioral disorder. It appears patient was discharged on Depakote 500 mg TID, Zyprexa 5 mg BID, Zyprexa 2.5 mg BID PRN agitation and Cogentin 0.5 mg BID -Psychiatry recommendations: Valproic acid, Zyprexa. Holding lithium. Limit use of restraints. -Delirium precautions  Wernicke's disease Patient with recent prolonged hospitalization. Secondary to prolonged alcohol use. 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 Stable. Per recent discharge summary, it appears patient was discharged on carbidopa-levodopa 25-100 mg TID -Continue Sinemet  History of alcohol abuse- (present on admission) No longed drinking alcohol per son.  Hypothyroidism- (present on admission) -Continue Synthroid 75 mcg daily     DVT prophylaxis: Lovenox Code Status:   Code Status: DNR Family Communication: None at bedside Disposition Plan: Discharge to behavioral hospital once bed is available. Medically stable for discharge.   Consultants:  Psychiatry  Procedures:  None  Antimicrobials: Unasyn    Subjective: No concerns today per patient.  Objective: Vitals:   06/22/21 0533 06/22/21 1345 06/22/21 2001 06/23/21 1410  BP: (!) 153/81 126/88 135/67 112/62  Pulse: 62 69 71 (!) 58  Resp: 16 19 18 20   Temp: 97.9 F (36.6 C) 97.9 F (36.6 C) 99.4 F (37.4 C) 97.9 F (36.6 C)  TempSrc: Oral  Oral Oral  SpO2: 97% 98% 98% 97%    Intake/Output Summary (Last 24 hours) at 06/23/2021 1615 Last data filed at 06/23/2021 1601 Gross per 24 hour  Intake 2195 ml  Output --  Net 2195 ml    There were no vitals filed for this visit.  Examination:  General exam: Appears calm and comfortable  Respiratory system: Clear to auscultation. Respiratory effort normal. Cardiovascular system: S1 & S2 heard, RRR.  Gastrointestinal system: Abdomen is nondistended, soft and nontender. No organomegaly or masses felt. Normal  bowel sounds heard. Central nervous system: Alert Musculoskeletal: No edema. No calf tenderness    Data Reviewed: I  have personally reviewed following labs and imaging studies  CBC Lab Results  Component Value Date   WBC 7.9 06/20/2021   RBC 3.69 (L) 06/20/2021   HGB 12.0 (L) 06/20/2021   HCT 37.1 (L) 06/20/2021   MCV 100.5 (H) 06/20/2021   MCH 32.5 06/20/2021   PLT 212 06/20/2021   MCHC 32.3 06/20/2021   RDW 12.2 06/20/2021   LYMPHSABS 0.8 06/18/2021   MONOABS 0.7 06/18/2021   EOSABS 0.1 06/18/2021   BASOSABS 0.0 06/18/2021     Last metabolic panel Lab Results  Component Value Date   NA 140 06/20/2021   K 4.5 06/20/2021   CL 105 06/20/2021   CO2 29 06/20/2021   BUN 20 06/20/2021   CREATININE 0.66 06/20/2021   GLUCOSE 94 06/20/2021   GFRNONAA >60 06/20/2021   GFRAA 100 04/07/2020   CALCIUM 8.9 06/20/2021   PHOS 3.4 06/18/2021   PROT 6.0 (L) 06/20/2021   ALBUMIN 3.6 06/20/2021   LABGLOB 2.3 04/07/2020   AGRATIO 1.8 04/07/2020   BILITOT 0.2 (L) 06/20/2021   ALKPHOS 49 06/20/2021   AST 24 06/20/2021   ALT 23 06/20/2021   ANIONGAP 6 06/20/2021    CBG (last 3)  No results for input(s): GLUCAP in the last 72 hours.   GFR: CrCl cannot be calculated (Unknown ideal weight.).  Coagulation Profile: No results for input(s): INR, PROTIME in the last 168 hours.  Recent Results (from the past 240 hour(s))  Resp Panel by RT-PCR (Flu A&B, Covid) Nasopharyngeal Swab     Status: None   Collection Time: 06/15/21  2:37 PM   Specimen: Nasopharyngeal Swab; Nasopharyngeal(NP) swabs in vial transport medium  Result Value Ref Range Status   SARS Coronavirus 2 by RT PCR NEGATIVE NEGATIVE Final    Comment: (NOTE) SARS-CoV-2 target nucleic acids are NOT DETECTED.  The SARS-CoV-2 RNA is generally detectable in upper respiratory specimens during the acute phase of infection. The lowest concentration of SARS-CoV-2 viral copies this assay can detect is 138 copies/mL. A  negative result does not preclude SARS-Cov-2 infection and should not be used as the sole basis for treatment or other patient management decisions. A negative result may occur with  improper specimen collection/handling, submission of specimen other than nasopharyngeal swab, presence of viral mutation(s) within the areas targeted by this assay, and inadequate number of viral copies(<138 copies/mL). A negative result must be combined with clinical observations, patient history, and epidemiological information. The expected result is Negative.  Fact Sheet for Patients:  BloggerCourse.com  Fact Sheet for Healthcare Providers:  SeriousBroker.it  This test is no t yet approved or cleared by the [place] FDA and  has been authorized for detection and/or diagnosis of SARS-CoV-2 by FDA under an Emergency Use Authorization (EUA). This EUA will remain  in effect (meaning this test can be used) for the duration of the COVID-19 declaration under Section 564(b)(1) of the Act, 21 U.S.C.section 360bbb-3(b)(1), unless the authorization is terminated  or revoked sooner.       Influenza A by PCR NEGATIVE NEGATIVE Final   Influenza B by PCR NEGATIVE NEGATIVE Final    Comment: (NOTE) The Xpert Xpress SARS-CoV-2/FLU/RSV plus assay is intended as an aid in the diagnosis of influenza from Nasopharyngeal swab specimens and should not be used as a sole basis for treatment. Nasal washings and aspirates are unacceptable for Xpert Xpress SARS-CoV-2/FLU/RSV testing.  Fact Sheet for Patients: BloggerCourse.com  Fact Sheet for Healthcare Providers: SeriousBroker.it  This test is not yet approved or  cleared by the [place] FDA and has been authorized for detection and/or diagnosis of SARS-CoV-2 by FDA under an Emergency Use Authorization (EUA). This EUA will remain in effect (meaning this test can  be used) for the duration of the COVID-19 declaration under Section 564(b)(1) of the Act, 21 U.S.C. section 360bbb-3(b)(1), unless the authorization is terminated or revoked.  Performed at [facility], [address] [place], [place] [address]   Resp Panel by RT-PCR (Flu A&B, Covid)     Status: None   Collection Time: 06/18/21  7:34 AM   Specimen: Nasopharyngeal(NP) swabs in vial transport medium  Result Value Ref Range Status   SARS Coronavirus 2 by RT PCR NEGATIVE NEGATIVE Final    Comment: (NOTE) SARS-CoV-2 target nucleic acids are NOT DETECTED.  The SARS-CoV-2 RNA is generally detectable in upper respiratory specimens during the acute phase of infection. The lowest concentration of SARS-CoV-2 viral copies this assay can detect is 138 copies/mL. A negative result does not preclude SARS-Cov-2 infection and should not be used as the sole basis for treatment or other patient management decisions. A negative result may occur with  improper specimen collection/handling, submission of specimen other than nasopharyngeal swab, presence of viral mutation(s) within the areas targeted by this assay, and inadequate number of viral copies(<138 copies/mL). A negative result must be combined with clinical observations, patient history, and epidemiological information. The expected result is Negative.  Fact Sheet for Patients:  BloggerCourse.comhttps://www.fda.gov/media/152166/download  Fact Sheet for Healthcare Providers:  SeriousBroker.ithttps://www.fda.gov/media/152162/download  This test is no t yet approved or cleared by the [place] FDA and  has been authorized for detection and/or diagnosis of SARS-CoV-2 by FDA under an Emergency Use Authorization (EUA). This EUA will remain  in effect (meaning this test can be used) for the duration of the COVID-19 declaration under Section 564(b)(1) of the Act, 21 U.S.C.section 360bbb-3(b)(1), unless the authorization is terminated  or revoked sooner.        Influenza A by PCR NEGATIVE NEGATIVE Final   Influenza B by PCR NEGATIVE NEGATIVE Final    Comment: (NOTE) The Xpert Xpress SARS-CoV-2/FLU/RSV plus assay is intended as an aid in the diagnosis of influenza from Nasopharyngeal swab specimens and should not be used as a sole basis for treatment. Nasal washings and aspirates are unacceptable for Xpert Xpress SARS-CoV-2/FLU/RSV testing.  Fact Sheet for Patients: BloggerCourse.comhttps://www.fda.gov/media/152166/download  Fact Sheet for Healthcare Providers: SeriousBroker.ithttps://www.fda.gov/media/152162/download  This test is not yet approved or cleared by the [place] FDA and has been authorized for detection and/or diagnosis of SARS-CoV-2 by FDA under an Emergency Use Authorization (EUA). This EUA will remain in effect (meaning this test can be used) for the duration of the COVID-19 declaration under Section 564(b)(1) of the Act, 21 U.S.C. section 360bbb-3(b)(1), unless the authorization is terminated or revoked.  Performed at [facility], [address] [place], [place] [address]   Culture, blood (routine x 2)     Status: None   Collection Time: 06/18/21  7:34 AM   Specimen: BLOOD  Result Value Ref Range Status   Specimen Description   Final    BLOOD LEFT ANTECUBITAL Performed at [facility], [address] [place], [place] [address]    Special Requests   Final    BOTTLES DRAWN AEROBIC AND ANAEROBIC Blood Culture adequate volume Performed at [facility], [address] [place], [place] [address]    Culture   Final    NO GROWTH 5  DAYS Performed at [facility], [address] [place], [place] [address]    Report Status 06/23/2021 FINAL  Final  Culture, blood (routine x 2)     Status: None   Collection Time: 06/18/21  7:34 AM   Specimen: BLOOD  Result Value Ref Range Status   Specimen Description   Final    BLOOD RIGHT ANTECUBITAL Performed at [facility], [address] [place], [place] [address]    Special Requests   Final    BOTTLES DRAWN AEROBIC AND ANAEROBIC Blood Culture adequate volume Performed at [facility], [address] [place], [place] [address]    Culture   Final    NO GROWTH 5 DAYS Performed at [facility], [address], [place], [place] [address]    Report Status 06/23/2021 FINAL  Final        Radiology Studies: EEG adult  Result Date: 2021-06-22 Rejeana Brock, MD     Jun 22, 2021  6:19 PM History: 76 yo M being evaluated for lethargy Sedation: none Technique: This EEG was acquired with electrodes placed according to the International 10-20 electrode system (including Fp1, Fp2, F3, F4, C3, C4, P3, P4, O1, O2, T3, T4, T5, T6, A1, A2, Fz, Cz, Pz). The following electrodes were missing or displaced: none. Background: The background consists of intermixed alpha and beta activities. There is a well defined posterior dominant rhythm of 9 Hz that attenuates with eye opening. With drowsiness there is anterior shifting of the PDR, but sleep is not recorded. Photic stimulation: Physiologic driving is not performed EEG Abnormalities: none Clinical Interpretation: This normal EEG is recorded in the waking and drowsy state. There was no seizure or seizure predisposition recorded on this study. Please note that lack of epileptiform activity on EEG does not preclude the possibility of epilepsy. Ritta Slot, MD [facility] [phone] If 7pm- 7am, please page neurology on call as listed in AMION.        Scheduled Meds:  carbidopa-levodopa  1 tablet Oral TID   divalproex  500 mg Oral TID   enoxaparin (LOVENOX) injection  40 mg Subcutaneous Q24H   levothyroxine  75 mcg Oral Q0600   memantine  5 mg Oral QHS   multivitamin with minerals  1 tablet Oral Daily   OLANZapine  5 mg Oral BID   thiamine  100 mg Oral Daily   Continuous Infusions:   LOS: 3 days     Jacquelin Hawking, MD [facility] 06/23/2021, 4:15 PM  If 7PM-7AM, please contact night-coverage [email or web address]

## 2021-06-24 DIAGNOSIS — R451 Restlessness and agitation: Secondary | ICD-10-CM

## 2021-06-24 DIAGNOSIS — F03918 Unspecified dementia, unspecified severity, with other behavioral disturbance: Secondary | ICD-10-CM | POA: Diagnosis not present

## 2021-06-24 DIAGNOSIS — T17908A Unspecified foreign body in respiratory tract, part unspecified causing other injury, initial encounter: Secondary | ICD-10-CM | POA: Diagnosis not present

## 2021-06-24 MED ORDER — OLANZAPINE 5 MG PO TBDP
5.0000 mg | ORAL_TABLET | Freq: Once | ORAL | Status: AC
  Administered 2021-06-24: 5 mg via ORAL
  Filled 2021-06-24: qty 1

## 2021-06-24 NOTE — Progress Notes (Signed)
Notified Trevor Muscat, NP that patient is still aggressive and combative towards staff while on restraint, still attempting to grab and pull staff. Also noted that patient is sweating badly while flapping hands, unsure if they are tremors. Blood glucose check and noted to be 103, patient also sounded like secretions are trapped in throat suctioned mouth but no secretions noted, oral care provided.

## 2021-06-24 NOTE — Progress Notes (Signed)
Pt continued to be cooperative and calm, all restraints has been DC.

## 2021-06-24 NOTE — Progress Notes (Signed)
Pt has been calm today as compared to yesterday, Ankle and Belt restraints were off him. Bilateral soft restraints are still applied on the pt. Charge nurse and MD were both notified.

## 2021-06-24 NOTE — Progress Notes (Signed)
Progress Note   Patient: Trevor Cook QPR:[id number] DOB: June 03, 1945 DOA: 06/15/2021     4 DOS: the patient was seen and examined on 06/24/2021   Brief hospital course: Lavone Weisel is a 76 y.o. male with a history of dementia, bipolar 1 disorder, epilepsy, CVA, hypothyroidism, Wernicke-Korsakoff syndrome. Patient presented secondary to agitation and aggression. While in the ED, he suffered an aspiration event and was admitted. Psychiatry consulted for discharge recommendations.  Assessment and Plan: * Aspiration into airway, initial encounter- (present on admission) No evidence of pneumonia clinically or on imaging. Aspiration of pills. Speech therapy consulted for recommendations. Started empirically on Unasyn. Per chart history, patient has a history of dysphagia. Unasyn discontinued. No changes in respiratory symptoms.  -SLP recommendations (2/14): Diet recommendations: Dysphagia 3 (mechanical soft);Thin liquid Liquids provided via: Cup;Straw Medication Administration: Whole meds with puree Supervision: Staff to assist with self feeding Compensations: Slow rate;Small sips/bites;Minimize environmental distractions (check for right sided pocketing, pt eats at rapid rate - assure slow rate please) Postural Changes and/or Swallow Maneuvers: Seated upright 90 degrees;Upright 30-60 min after meal.   Lethargy Unsure of etiology. Possibly related to Cogentin. No focal findings. Patient with a listed history of epilepsy, not currently on antiepileptic drugs. No witnessed seizure-like activity, but it is possible he could have been post-ictal; EEG obtained without active evidence of seizure activity. Ammonia normal. Patient was found to be on Sinemet as an outpatient which was not restarted on admission. Cogentin discontinued. Lethargy resolved 2/17.  History of CVA (cerebrovascular accident) Not on antiplatelet therapy. Patient possibly with resultant dysphagia. Noted.  Dementia with  behavioral disturbance Complicated by underlying diagnosis of Wernicke's disease. Patient recently required hospitalization secondary to behavioral disorder. It appears patient was discharged on Depakote 500 mg TID, Zyprexa 5 mg BID, Zyprexa 2.5 mg BID PRN agitation and Cogentin 0.5 mg BID -Psychiatry recommendations: Valproic acid, Zyprexa. Holding lithium. Limit use of restraints. -Delirium precautions - currently waiting for behavioral placement.   Wernicke's disease Patient with recent prolonged hospitalization. Secondary to prolonged alcohol use. 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 Stable. Per recent discharge summary, it appears patient was discharged on carbidopa-levodopa 25-100 mg TID -Continue Sinemet  History of alcohol abuse- (present on admission) No longed drinking alcohol per son.  Hypothyroidism- (present on admission) -Continue Synthroid 75 mcg daily        Subjective: no complaints.   Physical Exam: Vitals:   06/22/21 2001 06/23/21 1410 06/23/21 2100 06/24/21 0634  BP: 135/67 112/62 (!) 168/101 134/72  Pulse: 71 (!) 58 91 66  Resp: 18 20 (!) 24 20  Temp: 99.4 F (37.4 C) 97.9 F (36.6 C) 98.8 F (37.1 C) 97.8 F (36.6 C)  TempSrc: Oral Oral Axillary Oral  SpO2: 98% 97% 96% 97%   General exam: Appears calm and comfortable  Respiratory system: Clear to auscultation. Respiratory effort normal. Cardiovascular system: S1 & S2 heard, RRR. Marland Kitchen No pedal edema. Gastrointestinal system: Abdomen is nondistended, soft and nontender.  Central nervous system: Alert and comfortable.  Extremities: Symmetric 5 x 5 power. Skin: No rashes,  Psychiatry: cannot be assessed.   Data Reviewed:  There are no new results to review at this time.  Family Communication: none at bedside.   Disposition: Status is: Inpatient Remains inpatient appropriate because: unsafe d/c plan.          Planned Discharge  Destination:  behavioral placement.      Time spent: 32 minutes  Author: Kathlen Mody, MD  06/24/2021 10:01 AM  For on call review [email or web address].

## 2021-06-24 NOTE — Consult Note (Signed)
Brief Psychiatry Consult Note  The patient was last seen by the psychiatry service on 2/17. Pt seen briefly due to reports of significantly increased agitation in last 24 hours. At this time, patient is calmly resting in bed in wrist restraints only (previously in 4 point + lap band). No evidence of cogwheeling or rigidity on brief physical exam. He was oriented to self but unable to answer other questions, which is a change from NP note 06/22/20 (pt having conversations) and reversion to status on 2/16 where he was essentially nonverbal. Likely component of delirium overlying known dementia given waxing and waning ability to converse, attention, orientation although no clear precipitant.    No facial droop, etc to suggest CVA or need for urgent head imaging  - continue current medications. Psychiatry to see tomorrow.   Laiya Wisby A Shaine Mount

## 2021-06-24 NOTE — Plan of Care (Signed)
°  Problem: Clinical Measurements: Goal: Respiratory complications will improve Outcome: Not Progressing   Problem: Activity: Goal: Risk for activity intolerance will decrease Outcome: Not Progressing   Problem: Nutrition: Goal: Adequate nutrition will be maintained Outcome: Not Progressing   Problem: Elimination: Goal: Will not experience complications related to bowel motility Outcome: Not Progressing   Problem: Safety: Goal: Ability to remain free from injury will improve Outcome: Not Progressing   Problem: Safety: Goal: Violent Restraint(s) Outcome: Not Progressing

## 2021-06-25 DIAGNOSIS — Z8673 Personal history of transient ischemic attack (TIA), and cerebral infarction without residual deficits: Secondary | ICD-10-CM | POA: Diagnosis not present

## 2021-06-25 DIAGNOSIS — T17908A Unspecified foreign body in respiratory tract, part unspecified causing other injury, initial encounter: Secondary | ICD-10-CM | POA: Diagnosis not present

## 2021-06-25 DIAGNOSIS — F03918 Unspecified dementia, unspecified severity, with other behavioral disturbance: Secondary | ICD-10-CM | POA: Diagnosis not present

## 2021-06-25 DIAGNOSIS — I10 Essential (primary) hypertension: Secondary | ICD-10-CM | POA: Diagnosis not present

## 2021-06-25 LAB — CREATININE, SERUM
Creatinine, Ser: 0.72 mg/dL (ref 0.61–1.24)
GFR, Estimated: >60 mL/min (ref >60)

## 2021-06-25 LAB — VALPROIC ACID LEVEL: Valproic Acid Lvl: 42 ug/mL — ABNORMAL LOW (ref 50.0–100.0)

## 2021-06-25 NOTE — Progress Notes (Signed)
Pt is tryng to get out of bed despite wrist restraints, sitter & NT assisting pt with a urinal and he became agitated & threw the urinal and began kicking legs at the staff. Ankle restraints had to be re applied at this time.

## 2021-06-25 NOTE — Progress Notes (Signed)
PT Cancellation Note  Patient Details Name: Damian Buckles MRN: [id number] DOB: [birth year]   Cancelled Treatment:    Reason Eval/Treat Not Completed: Medical issues which prohibited therapy--RN recommended PT be held-pt currently in restraints. Will check back as schedule allows.    Faye Ramsay, PT Acute Rehabilitation  Office: [phone] Pager: [phone]

## 2021-06-25 NOTE — TOC Progression Note (Signed)
Transition of Care [facility]) - Progression Note    Patient Details  Name: Trevor Cook MRN: [id number] Date of Birth: 1945/09/13  Transition of [facility]) CM/SW Contact  Darleene Cleaver, [place] Phone Number: 06/25/2021, 4:58 PM  Clinical Narrative:     CSW continuing to follow patient's progress throughout discharge planning.  Patient still does not have any beds.   Expected Discharge Plan:  (TBD) Barriers to Discharge: Continued Medical Work up  Expected Discharge Plan and Services Expected Discharge Plan:  (TBD)       Living arrangements for the past 2 months:  ([facility]                                       Social Determinants of Health (SDOH) Interventions    Readmission Risk Interventions No flowsheet data found.

## 2021-06-25 NOTE — Progress Notes (Signed)
PROGRESS NOTE    Trevor Cook  GGY:[id number] DOB: Sep 05, 1945 DOA: 06/15/2021 PCP: Aida Puffer, MD   Brief Narrative: Trevor Cook is a 76 y.o. male with a history of dementia, bipolar 1 disorder, epilepsy, CVA, hypothyroidism, Wernicke-Korsakoff syndrome. Patient presented secondary to agitation and aggression. While in the ED, he suffered an aspiration event and was admitted. Psychiatry consulted for discharge recommendations.   Assessment and Plan: * Aspiration into airway, initial encounter- (present on admission) No evidence of pneumonia clinically or on imaging. Aspiration of pills. Speech therapy consulted for recommendations. Started empirically on Unasyn. Per chart history, patient has a history of dysphagia. Unasyn discontinued. No changes in respiratory symptoms.  -SLP recommendations (2/14): Diet recommendations: Dysphagia 3 (mechanical soft);Thin liquid Liquids provided via: Cup;Straw Medication Administration: Whole meds with puree Supervision: Staff to assist with self feeding Compensations: Slow rate;Small sips/bites;Minimize environmental distractions (check for right sided pocketing, pt eats at rapid rate - assure slow rate please) Postural Changes and/or Swallow Maneuvers: Seated upright 90 degrees;Upright 30-60 min after meal.   Lethargy Unsure of etiology. Possibly related to Cogentin. No focal findings. Patient with a listed history of epilepsy, not currently on antiepileptic drugs. No witnessed seizure-like activity, but it is possible he could have been post-ictal; EEG obtained without active evidence of seizure activity. Ammonia normal. Patient was found to be on Sinemet as an outpatient which was not restarted on admission. Cogentin discontinued. Lethargy resolved 2/17.  History of CVA (cerebrovascular accident) Not on antiplatelet therapy. Patient possibly with resultant dysphagia. Noted.  Dementia with behavioral disturbance Complicated by underlying  diagnosis of Wernicke's disease. Patient recently required hospitalization secondary to behavioral disorder. It appears patient was discharged on Depakote 500 mg TID, Zyprexa 5 mg BID, Zyprexa 2.5 mg BID PRN agitation and Cogentin 0.5 mg BID. Patient with recurrent episodes of agitation requiring restraints. -Psychiatry recommendations: Valproic acid, Zyprexa. Holding lithium. Limit use of restraints. Pending today -Delirium precautions  Wernicke's disease Patient with recent prolonged hospitalization. Secondary to prolonged alcohol use. 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 Stable. Per recent discharge summary, it appears patient was discharged on carbidopa-levodopa 25-100 mg TID -Continue Sinemet  History of alcohol abuse- (present on admission) No longed drinking alcohol per son.  Hypothyroidism- (present on admission) -Continue Synthroid 75 mcg daily     DVT prophylaxis: Lovenox Code Status:   Code Status: DNR Family Communication: None at bedside Disposition Plan: Discharge to behavioral hospital once bed is available. Medically stable for discharge.   Consultants:  Psychiatry  Procedures:  None  Antimicrobials: Unasyn    Subjective: Patient without concerns. When asking him how he was doing, he replied, "better than you."  Objective: Vitals:   06/24/21 0634 06/24/21 1909 06/25/21 0500 06/25/21 0503  BP: 134/72 133/65 (!) 146/74 (!) 146/74  Pulse: 66 64 68 71  Resp: 20 (!) 21 16 16   Temp: 97.8 F (36.6 C) (!) 97.5 F (36.4 C)    TempSrc: Oral Oral Oral   SpO2: 97% 96% 98% 97%    Intake/Output Summary (Last 24 hours) at 06/25/2021 1402 Last data filed at 06/25/2021 1300 Gross per 24 hour  Intake 1520 ml  Output 1900 ml  Net -380 ml    There were no vitals filed for this visit.  Examination:  General exam: No distress Respiratory system: Clear to auscultation. Respiratory effort  normal. Cardiovascular system: S1 & S2 heard, RRR Gastrointestinal system: Abdomen is nondistended, soft and nontender. No organomegaly or masses felt.  Normal bowel sounds heard. Central nervous system: Alert and oriented. No focal neurological deficits. Musculoskeletal: No calf tenderness Skin: No cyanosis. No rashes Psychiatry: Slightly anxious appearing     Data Reviewed: I have personally reviewed following labs and imaging studies  CBC Lab Results  Component Value Date   WBC 7.9 06/20/2021   RBC 3.69 (L) 06/20/2021   HGB 12.0 (L) 06/20/2021   HCT 37.1 (L) 06/20/2021   MCV 100.5 (H) 06/20/2021   MCH 32.5 06/20/2021   PLT 212 06/20/2021   MCHC 32.3 06/20/2021   RDW 12.2 06/20/2021   LYMPHSABS 0.8 06/18/2021   MONOABS 0.7 06/18/2021   EOSABS 0.1 06/18/2021   BASOSABS 0.0 06/18/2021     Last metabolic panel Lab Results  Component Value Date   NA 140 06/20/2021   K 4.5 06/20/2021   CL 105 06/20/2021   CO2 29 06/20/2021   BUN 20 06/20/2021   CREATININE 0.72 06/25/2021   GLUCOSE 94 06/20/2021   GFRNONAA >60 06/25/2021   GFRAA 100 04/07/2020   CALCIUM 8.9 06/20/2021   PHOS 3.4 06/18/2021   PROT 6.0 (L) 06/20/2021   ALBUMIN 3.6 06/20/2021   LABGLOB 2.3 04/07/2020   AGRATIO 1.8 04/07/2020   BILITOT 0.2 (L) 06/20/2021   ALKPHOS 49 06/20/2021   AST 24 06/20/2021   ALT 23 06/20/2021   ANIONGAP 6 06/20/2021    CBG (last 3)  Recent Labs    06/23/21 2210  GLUCAP 103*      GFR: CrCl cannot be calculated (Unknown ideal weight.).  Coagulation Profile: No results for input(s): INR, PROTIME in the last 168 hours.  Recent Results (from the past 240 hour(s))  Resp Panel by RT-PCR (Flu A&B, Covid) Nasopharyngeal Swab     Status: None   Collection Time: 06/15/21  2:37 PM   Specimen: Nasopharyngeal Swab; Nasopharyngeal(NP) swabs in vial transport medium  Result Value Ref Range Status   SARS Coronavirus 2 by RT PCR NEGATIVE NEGATIVE Final    Comment:  (NOTE) SARS-CoV-2 target nucleic acids are NOT DETECTED.  The SARS-CoV-2 RNA is generally detectable in upper respiratory specimens during the acute phase of infection. The lowest concentration of SARS-CoV-2 viral copies this assay can detect is 138 copies/mL. A negative result does not preclude SARS-Cov-2 infection and should not be used as the sole basis for treatment or other patient management decisions. A negative result may occur with  improper specimen collection/handling, submission of specimen other than nasopharyngeal swab, presence of viral mutation(s) within the areas targeted by this assay, and inadequate number of viral copies(<138 copies/mL). A negative result must be combined with clinical observations, patient history, and epidemiological information. The expected result is Negative.  Fact Sheet for Patients:  BloggerCourse.comhttps://www.fda.gov/media/152166/download  Fact Sheet for Healthcare Providers:  SeriousBroker.ithttps://www.fda.gov/media/152162/download  This test is no t yet approved or cleared by the [place] FDA and  has been authorized for detection and/or diagnosis of SARS-CoV-2 by FDA under an Emergency Use Authorization (EUA). This EUA will remain  in effect (meaning this test can be used) for the duration of the COVID-19 declaration under Section 564(b)(1) of the Act, 21 U.S.C.section 360bbb-3(b)(1), unless the authorization is terminated  or revoked sooner.       Influenza A by PCR NEGATIVE NEGATIVE Final   Influenza B by PCR NEGATIVE NEGATIVE Final    Comment: (NOTE) The Xpert Xpress SARS-CoV-2/FLU/RSV plus assay is intended as an aid in the diagnosis of influenza from Nasopharyngeal swab specimens and should not be used as  a sole basis for treatment. Nasal washings and aspirates are unacceptable for Xpert Xpress SARS-CoV-2/FLU/RSV testing.  Fact Sheet for Patients: BloggerCourse.com  Fact Sheet for Healthcare  Providers: SeriousBroker.it  This test is not yet approved or cleared by the [place] FDA and has been authorized for detection and/or diagnosis of SARS-CoV-2 by FDA under an Emergency Use Authorization (EUA). This EUA will remain in effect (meaning this test can be used) for the duration of the COVID-19 declaration under Section 564(b)(1) of the Act, 21 U.S.C. section 360bbb-3(b)(1), unless the authorization is terminated or revoked.  Performed at [facility], [address], [place], [place] [address]   Resp Panel by RT-PCR (Flu A&B, Covid)     Status: None   Collection Time: 06/18/21  7:34 AM   Specimen: Nasopharyngeal(NP) swabs in vial transport medium  Result Value Ref Range Status   SARS Coronavirus 2 by RT PCR NEGATIVE NEGATIVE Final    Comment: (NOTE) SARS-CoV-2 target nucleic acids are NOT DETECTED.  The SARS-CoV-2 RNA is generally detectable in upper respiratory specimens during the acute phase of infection. The lowest concentration of SARS-CoV-2 viral copies this assay can detect is 138 copies/mL. A negative result does not preclude SARS-Cov-2 infection and should not be used as the sole basis for treatment or other patient management decisions. A negative result may occur with  improper specimen collection/handling, submission of specimen other than nasopharyngeal swab, presence of viral mutation(s) within the areas targeted by this assay, and inadequate number of viral copies(<138 copies/mL). A negative result must be combined with clinical observations, patient history, and epidemiological information. The expected result is Negative.  Fact Sheet for Patients:  BloggerCourse.com  Fact Sheet for Healthcare Providers:  SeriousBroker.it  This test is no t yet approved or cleared by the [place] FDA and  has been authorized for detection and/or diagnosis of  SARS-CoV-2 by FDA under an Emergency Use Authorization (EUA). This EUA will remain  in effect (meaning this test can be used) for the duration of the COVID-19 declaration under Section 564(b)(1) of the Act, 21 U.S.C.section 360bbb-3(b)(1), unless the authorization is terminated  or revoked sooner.       Influenza A by PCR NEGATIVE NEGATIVE Final   Influenza B by PCR NEGATIVE NEGATIVE Final    Comment: (NOTE) The Xpert Xpress SARS-CoV-2/FLU/RSV plus assay is intended as an aid in the diagnosis of influenza from Nasopharyngeal swab specimens and should not be used as a sole basis for treatment. Nasal washings and aspirates are unacceptable for Xpert Xpress SARS-CoV-2/FLU/RSV testing.  Fact Sheet for Patients: BloggerCourse.com  Fact Sheet for Healthcare Providers: SeriousBroker.it  This test is not yet approved or cleared by the [place] FDA and has been authorized for detection and/or diagnosis of SARS-CoV-2 by FDA under an Emergency Use Authorization (EUA). This EUA will remain in effect (meaning this test can be used) for the duration of the COVID-19 declaration under Section 564(b)(1) of the Act, 21 U.S.C. section 360bbb-3(b)(1), unless the authorization is terminated or revoked.  Performed at [facility], [address] [place], [place] [address]   Culture, blood (routine x 2)     Status: None   Collection Time: 06/18/21  7:34 AM   Specimen: BLOOD  Result Value Ref Range Status   Specimen Description   Final    BLOOD LEFT ANTECUBITAL Performed at [facility], [address], [place], [place] [address]    Special Requests   Final  BOTTLES DRAWN AEROBIC AND ANAEROBIC Blood Culture adequate volume Performed at [facility], [address] [place], [place] [address]    Culture   Final    NO GROWTH 5 DAYS Performed at [facility], [address] [place], [place] [address]    Report Status 06/23/2021 FINAL  Final  Culture, blood (routine x 2)     Status: None   Collection Time: 06/18/21  7:34 AM   Specimen: BLOOD  Result Value Ref Range Status   Specimen Description   Final    BLOOD RIGHT ANTECUBITAL Performed at [facility], [address], [place], [place] [address]    Special Requests   Final    BOTTLES DRAWN AEROBIC AND ANAEROBIC Blood Culture adequate volume Performed at [facility], [address] [place], [place] [address]    Culture   Final    NO GROWTH 5 DAYS Performed at [facility], [address] [place], [place] [address]    Report Status 06/23/2021 FINAL  Final        Radiology Studies: No results found.      Scheduled Meds:  carbidopa-levodopa  1 tablet Oral TID   divalproex  500 mg Oral TID   enoxaparin (LOVENOX) injection  40 mg Subcutaneous Q24H   levothyroxine  75 mcg Oral Q0600   mouth rinse  15 mL Mouth Rinse BID   memantine  5 mg Oral QHS   multivitamin with minerals  1 tablet Oral Daily   OLANZapine  5 mg Oral BID   thiamine  100 mg Oral Daily   Continuous Infusions:   LOS: 5 days     Jacquelin Hawking, MD [facility] 06/25/2021, 2:02 PM  If 7PM-7AM, please contact night-coverage [email or web address]

## 2021-06-25 NOTE — Progress Notes (Signed)
I called pts son Thayer Ohm back when I was in pts room to give him an update and let his talk to his father. Trevor Cook remains in bed with wrist restraints on & sitter at the bedside. [name] mumbles a few words that you can understand but we still have difficulty understanding him and he has trouble following commands.  Thayer Ohm wants both Dr Mal Misty & Dr Gasper Sells to call him Tuesday as he needs an update on plan of care. Thayer Ohm states [facility] would take pt back if he was in a calm medically stable condition, I advised Thayer Ohm that his father is still having aggressive tendencies

## 2021-06-25 NOTE — Progress Notes (Signed)
A 2245, patient started wandering and pacing along the hallway, cannot be redirected to go back to bed.sitter on contact guard while ambulating.  At 2318, patient has become uncontrollable and started throwing phone at the hallway, spitting and agitated, paged Audrea Muscat about situation as patient is not being redirected. Called son, Thayer Ohm and explain to him that restraint might be re-applied to his da, He spoke to his dad on the phone and tried to pacify him.  At 2339, patient finally agreed to go back to bed, he laid  and as we were about to put on restraint, he started kicking  Nurse Byrd Hesselbach, 4 point restraint applied as ordered, Son Thayer Ohm still on the phone, updated that patient has been placed on 4 point restraint already, states he understand the need for it and he will come tomorrow morning.

## 2021-06-25 NOTE — Plan of Care (Signed)
°  Problem: Health Behavior/Discharge Planning: Goal: Ability to manage health-related needs will improve Outcome: Not Progressing   Problem: Education: Goal: Knowledge of General Education information will improve Description: Including pain rating scale, medication(s)/side effects and non-pharmacologic comfort measures Outcome: Not Progressing   Problem: Safety: Goal: Non-violent Restraint(s) Outcome: Not Progressing   Problem: Skin Integrity: Goal: Risk for impaired skin integrity will decrease Outcome: Not Progressing   Problem: Safety: Goal: Ability to remain free from injury will improve Outcome: Not Progressing

## 2021-06-25 NOTE — Plan of Care (Addendum)
Pt Aox1, confused and irritable. Combative and aggressive over night.  This am, he is following  basic commands. He remains in 2-point, soft restraints for patient safety. Will evaluate throughout day for possible removal.   TOC working on safe discharge dispo with family.   Problem: Safety: Goal: Non-violent Restraint(s) Outcome: Progressing  Problem: Clinical Measurements: Goal: Ability to maintain clinical measurements within normal limits will improve Outcome: Progressing   Problem: Activity: Goal: Risk for activity intolerance will decrease Outcome: Progressing   Problem: Nutrition: Goal: Adequate nutrition will be maintained Outcome: Progressing   Problem: Coping: Goal: Level of anxiety will decrease Outcome: Progressing   Problem: Pain Managment: Goal: General experience of comfort will improve Outcome: Progressing   Problem: Safety: Goal: Ability to remain free from injury will improve Outcome: Progressing   Problem: Skin Integrity: Goal: Risk for impaired skin integrity will decrease Outcome: Progressing

## 2021-06-26 DIAGNOSIS — I10 Essential (primary) hypertension: Secondary | ICD-10-CM | POA: Diagnosis not present

## 2021-06-26 DIAGNOSIS — Z8673 Personal history of transient ischemic attack (TIA), and cerebral infarction without residual deficits: Secondary | ICD-10-CM | POA: Diagnosis not present

## 2021-06-26 DIAGNOSIS — T17908A Unspecified foreign body in respiratory tract, part unspecified causing other injury, initial encounter: Secondary | ICD-10-CM | POA: Diagnosis not present

## 2021-06-26 DIAGNOSIS — F03918 Unspecified dementia, unspecified severity, with other behavioral disturbance: Secondary | ICD-10-CM | POA: Diagnosis not present

## 2021-06-26 MED ORDER — DIVALPROEX SODIUM 250 MG PO DR TAB
500.0000 mg | DELAYED_RELEASE_TABLET | Freq: Two times a day (BID) | ORAL | Status: DC
  Administered 2021-06-26 – 2021-07-11 (×30): 500 mg via ORAL
  Filled 2021-06-26 (×31): qty 2

## 2021-06-26 MED ORDER — OLANZAPINE 5 MG PO TABS
7.5000 mg | ORAL_TABLET | Freq: Two times a day (BID) | ORAL | Status: DC
  Administered 2021-06-26 – 2021-07-06 (×20): 7.5 mg via ORAL
  Administered 2021-07-06: 2.5 mg via ORAL
  Administered 2021-07-07 – 2021-07-08 (×3): 7.5 mg via ORAL
  Filled 2021-06-26 (×25): qty 1

## 2021-06-26 MED ORDER — AMLODIPINE BESYLATE 5 MG PO TABS
5.0000 mg | ORAL_TABLET | Freq: Every day | ORAL | Status: DC
  Administered 2021-06-26 – 2021-07-08 (×13): 5 mg via ORAL
  Filled 2021-06-26 (×13): qty 1

## 2021-06-26 MED ORDER — DIVALPROEX SODIUM 250 MG PO DR TAB
750.0000 mg | DELAYED_RELEASE_TABLET | Freq: Every day | ORAL | Status: DC
Start: 2021-06-26 — End: 2021-07-11
  Administered 2021-06-26 – 2021-07-10 (×15): 750 mg via ORAL
  Filled 2021-06-26 (×15): qty 3

## 2021-06-26 NOTE — Assessment & Plan Note (Addendum)
Blood pressure now stable. Continue Norvasc.

## 2021-06-26 NOTE — Progress Notes (Signed)
PROGRESS NOTE    Trevor Cook  OOI:[id number] DOB: 10-11-1945 DOA: 06/15/2021 PCP: Aida Puffer, MD   Brief Narrative: Trevor Cook is a 76 y.o. male with a history of dementia, bipolar 1 disorder, epilepsy, CVA, hypothyroidism, Wernicke-Korsakoff syndrome. Patient presented secondary to agitation and aggression. While in the ED, he suffered an aspiration event and was admitted. Psychiatry consulted for discharge recommendations.   Assessment and Plan: * Aspiration into airway, initial encounter- (present on admission) No evidence of pneumonia clinically or on imaging. Aspiration of pills. Speech therapy consulted for recommendations. Started empirically on Unasyn. Per chart history, patient has a history of dysphagia. Unasyn discontinued. No changes in respiratory symptoms.  -SLP recommendations (2/14): Diet recommendations: Dysphagia 3 (mechanical soft);Thin liquid Liquids provided via: Cup;Straw Medication Administration: Whole meds with puree Supervision: Staff to assist with self feeding Compensations: Slow rate;Small sips/bites;Minimize environmental distractions (check for right sided pocketing, pt eats at rapid rate - assure slow rate please) Postural Changes and/or Swallow Maneuvers: Seated upright 90 degrees;Upright 30-60 min after meal.   Lethargy Unsure of etiology. Possibly related to Cogentin. No focal findings. Patient with a listed history of epilepsy, not currently on antiepileptic drugs. No witnessed seizure-like activity, but it is possible he could have been post-ictal; EEG obtained without active evidence of seizure activity. Ammonia normal. Patient was found to be on Sinemet as an outpatient which was not restarted on admission. Cogentin discontinued. Lethargy resolved 2/17.  History of CVA (cerebrovascular accident) Not on antiplatelet therapy. Patient possibly with resultant dysphagia. Noted.  Dementia with behavioral disturbance Complicated by underlying  diagnosis of Wernicke's disease. Patient recently required hospitalization secondary to behavioral disorder. It appears patient was discharged on Depakote 500 mg TID, Zyprexa 5 mg BID, Zyprexa 2.5 mg BID PRN agitation and Cogentin 0.5 mg BID. Patient with recurrent episodes of agitation requiring restraints. -Psychiatry recommendations: Valproic acid 500 mg in AM/PM and 750 mg at night, Zyprexa 7.5 mg PO BID. Holding lithium. Limit use of restraints. -Delirium precautions  Wernicke's disease Patient with recent prolonged hospitalization. Secondary to prolonged alcohol use. 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 Stable. Per recent discharge summary, it appears patient was discharged on carbidopa-levodopa 25-100 mg TID -Continue Sinemet  History of alcohol abuse- (present on admission) No longed drinking alcohol per son.  Hypothyroidism- (present on admission) -Continue Synthroid 75 mcg daily  Essential hypertension- (present on admission) Blood pressure persistently elevated. -Start amlodipine 5 mg daily     DVT prophylaxis: Lovenox Code Status:   Code Status: DNR Family Communication: None at bedside Disposition Plan: Discharge to behavioral hospital vs memory care once bed is available. Medically stable for discharge.   Consultants:  Psychiatry  Procedures:  None  Antimicrobials: Unasyn    Subjective: No issues noted overnight.  Objective: Vitals:   06/25/21 0503 06/25/21 1500 06/25/21 2155 06/26/21 0447  BP: (!) 146/74 (!) 147/82 (!) 150/70 (!) 150/61  Pulse: 71 82 (!) 105 (!) 52  Resp: 16 18 18 16   Temp:  (!) 97.5 F (36.4 C) 98.4 F (36.9 C) 98.5 F (36.9 C)  TempSrc:  Oral Oral Oral  SpO2: 97% 97% 98% 96%    Intake/Output Summary (Last 24 hours) at 06/26/2021 1445 Last data filed at 06/26/2021 1206 Gross per 24 hour  Intake 240 ml  Output 2450 ml  Net -2210 ml    There were no vitals filed for this  visit.  Examination:  General: No distress Respiratory: Unlabored work of breathing.  Data Reviewed: I have personally reviewed following labs and imaging studies  CBC Lab Results  Component Value Date   WBC 7.9 06/20/2021   RBC 3.69 (L) 06/20/2021   HGB 12.0 (L) 06/20/2021   HCT 37.1 (L) 06/20/2021   MCV 100.5 (H) 06/20/2021   MCH 32.5 06/20/2021   PLT 212 06/20/2021   MCHC 32.3 06/20/2021   RDW 12.2 06/20/2021   LYMPHSABS 0.8 06/18/2021   MONOABS 0.7 06/18/2021   EOSABS 0.1 06/18/2021   BASOSABS 0.0 06/18/2021     Last metabolic panel Lab Results  Component Value Date   NA 140 06/20/2021   K 4.5 06/20/2021   CL 105 06/20/2021   CO2 29 06/20/2021   BUN 20 06/20/2021   CREATININE 0.72 06/25/2021   GLUCOSE 94 06/20/2021   GFRNONAA >60 06/25/2021   GFRAA 100 04/07/2020   CALCIUM 8.9 06/20/2021   PHOS 3.4 06/18/2021   PROT 6.0 (L) 06/20/2021   ALBUMIN 3.6 06/20/2021   LABGLOB 2.3 04/07/2020   AGRATIO 1.8 04/07/2020   BILITOT 0.2 (L) 06/20/2021   ALKPHOS 49 06/20/2021   AST 24 06/20/2021   ALT 23 06/20/2021   ANIONGAP 6 06/20/2021    CBG (last 3)  Recent Labs    06/23/21 2210  GLUCAP 103*      GFR: CrCl cannot be calculated (Unknown ideal weight.).  Coagulation Profile: No results for input(s): INR, PROTIME in the last 168 hours.  Recent Results (from the past 240 hour(s))  Resp Panel by RT-PCR (Flu A&B, Covid)     Status: None   Collection Time: 06/18/21  7:34 AM   Specimen: Nasopharyngeal(NP) swabs in vial transport medium  Result Value Ref Range Status   SARS Coronavirus 2 by RT PCR NEGATIVE NEGATIVE Final    Comment: (NOTE) SARS-CoV-2 target nucleic acids are NOT DETECTED.  The SARS-CoV-2 RNA is generally detectable in upper respiratory specimens during the acute phase of infection. The lowest concentration of SARS-CoV-2 viral copies this assay can detect is 138 copies/mL. A negative result does not preclude SARS-Cov-2 infection and  should not be used as the sole basis for treatment or other patient management decisions. A negative result may occur with  improper specimen collection/handling, submission of specimen other than nasopharyngeal swab, presence of viral mutation(s) within the areas targeted by this assay, and inadequate number of viral copies(<138 copies/mL). A negative result must be combined with clinical observations, patient history, and epidemiological information. The expected result is Negative.  Fact Sheet for Patients:  BloggerCourse.com  Fact Sheet for Healthcare Providers:  SeriousBroker.it  This test is no t yet approved or cleared by the [place] FDA and  has been authorized for detection and/or diagnosis of SARS-CoV-2 by FDA under an Emergency Use Authorization (EUA). This EUA will remain  in effect (meaning this test can be used) for the duration of the COVID-19 declaration under Section 564(b)(1) of the Act, 21 U.S.C.section 360bbb-3(b)(1), unless the authorization is terminated  or revoked sooner.       Influenza A by PCR NEGATIVE NEGATIVE Final   Influenza B by PCR NEGATIVE NEGATIVE Final    Comment: (NOTE) The Xpert Xpress SARS-CoV-2/FLU/RSV plus assay is intended as an aid in the diagnosis of influenza from Nasopharyngeal swab specimens and should not be used as a sole basis for treatment. Nasal washings and aspirates are unacceptable for Xpert Xpress SARS-CoV-2/FLU/RSV testing.  Fact Sheet for Patients: BloggerCourse.com  Fact Sheet for Healthcare Providers: SeriousBroker.it  This test is not yet  approved or cleared by the [place] and has been authorized for detection and/or diagnosis of SARS-CoV-2 by FDA under an Emergency Use Authorization (EUA). This EUA will remain in effect (meaning this test can be used) for the duration of the COVID-19 declaration  under Section 564(b)(1) of the Act, 21 U.S.C. section 360bbb-3(b)(1), unless the authorization is terminated or revoked.  Performed at [facility], [address] [place], [place] [address]   Culture, blood (routine x 2)     Status: None   Collection Time: 06/18/21  7:34 AM   Specimen: BLOOD  Result Value Ref Range Status   Specimen Description   Final    BLOOD LEFT ANTECUBITAL Performed at [facility], [address], [place], [place] [address]    Special Requests   Final    BOTTLES DRAWN AEROBIC AND ANAEROBIC Blood Culture adequate volume Performed at [facility], [address] [place], [place] [address]    Culture   Final    NO GROWTH 5 DAYS Performed at [facility], [address] [place], [place] [address]    Report Status 06/23/2021 FINAL  Final  Culture, blood (routine x 2)     Status: None   Collection Time: 06/18/21  7:34 AM   Specimen: BLOOD  Result Value Ref Range Status   Specimen Description   Final    BLOOD RIGHT ANTECUBITAL Performed at [facility], [address], [place], [place] [address]    Special Requests   Final    BOTTLES DRAWN AEROBIC AND ANAEROBIC Blood Culture adequate volume Performed at [facility], [address] [place], [place] [address]    Culture   Final    NO GROWTH 5 DAYS Performed at [facility], [address] [place], [place] [address]    Report Status 06/23/2021 FINAL  Final        Radiology Studies: No results found.      Scheduled Meds:  amLODipine  5 mg Oral Daily   carbidopa-levodopa  1 tablet Oral TID   divalproex  500 mg Oral BID   And   divalproex  750 mg Oral QHS   enoxaparin (LOVENOX) injection  40 mg Subcutaneous Q24H   levothyroxine  75 mcg Oral Q0600   mouth rinse  15 mL Mouth Rinse BID   memantine  5 mg Oral QHS   multivitamin with minerals  1 tablet Oral Daily   OLANZapine  7.5 mg  Oral BID   thiamine  100 mg Oral Daily   Continuous Infusions:   LOS: 6 days     Jacquelin Hawking, MD [facility] 06/26/2021, 2:45 PM  If 7PM-7AM, please contact night-coverage [email or web address]

## 2021-06-26 NOTE — Consult Note (Signed)
Trevor Cook is a 76 y.o. male that has a previous history of bipolar disorder, alcohol use disorder in remission since 1999, Wernicke Korsakoff, and stroke in 2021, who was brought to [facility] via nursing facility due to agitation, increased aggression, and suspected aspiration.  Pt admitted under IVC.  Pt recently discharged from [facility] after a 20-month length of stay.  Patient has also been discharged from multiple ALF facilities due to his aggression and agitation.   During this admission patient has been exhibiting sundowners, not sleeping, restless, psychomotor agitation, and increase in physical aggression which has resulted in restraints.    Patient has history of worsening cognitive impairment, wandering behaviors, and decrease in mental status. Patient is asleep on  on today's exam, apparently had a rough night per sitter.   Spoke with his son who provided ongoing information about his father and being his caretaker for 15 years. Son confirms that his father is able to return to [facility] after medication adjustments. We discussed at length the disease progression, overall prognosis and appropriateness of medication managements/adjustments. Emphasis placed on unnecessary medication dosages that result in sedation of a patient, to minimize or make behaviors obsolete. Son was very informative and well versed to mental health and seemed reasonable. We discussed valproic acid level, up titration of depakote and increase in olanzapine to further target agitation and aggression.   Patients current presentation consistent with known diagnoses of Dementia with behavioral problem, and Wernicke -Korsakoff syndrome rather than psychiatric decompensation. Historically patient has increase in behavioral outburst when Depakote levels are decreased. Current Depakote levels obtained Monday Monday were not within therapeutic range 42. Furthermore due to significant dementia unlikely  to benefit from milieu therapy following 82 length of stay at [facility].   Patient was seen for reassessment, although he was asleep.  Patient continues to experience ongoing sundowners, that results in disruptive, agitation, aggression and behaviors.  As previously noted patient does become agitated when he is unable to ambulate to the restroom.  Patient may benefit from memory care/assisted living facility.  Will psych clear at this time, it is encouraged that we continue current medications and safety measures.   Discussed with his staff that he may experience some confusion, agitation, and restlessness as well as Sundowners this is completely normal behavior in a person of this age. It is felt that patient may need higher level of care due to worsening memory impairment, confusion, wandering behaviors, and restlessness.  Patient behaviors are consistent with a person with dementia and other cognitive impairment. Memory care unit,  ALF, Home Health unit would be appropriate as they are well equipped to help elderly persons with memory loss, maintain cognitive skills and help with quality of life. Patient is able to take care of self, however needs more social interaction and person centered care to help with worsening memory impairment and difficult dementia behaviors.    -Will increase olanzapine 7.5 mg p.o. twice daily.  Recent valproic acid level of 42, will increase Depakote.  Depakote 500 mg p.o. twice daily, 750 mg p.o. nightly.    -Continue with safety sitter, as patient will require frequent re-orientation and redirection.  -Recommend continuing delirium precautions -Continue to limit use of restraint, these tend to make matters worse. -We will continue to target behaviors, aggression, agitation although do suspect disease progression.  Patient should be able to return to memory care unit [facility]) within the next few days.  Psychiatry will sign off at this  time.

## 2021-06-26 NOTE — Progress Notes (Signed)
PT Cancellation Note  Patient Details Name: Trevor Cook MRN: [id number] DOB: 1946-01-15   Cancelled Treatment:    Reason Eval/Treat Not Completed: Medical issues which prohibited therapy,  sitter with patient , in bilateral wrist restraints , assisted to slide up in bed, Not ready for PT eval  due to restraints and safety. Patient calm and  ready to be fed lunch. Will check back when patient is out of restraints. Blanchard Kelch PT Acute Rehabilitation Services Pager [phone] Office [phone]     Rada Hay 06/26/2021, 1:34 PM

## 2021-06-26 NOTE — Plan of Care (Signed)
Pt Aox1, confused and irritable at times. Combative and aggressive over night.  This am, he is following basic commands and cooperative with this RN. He remains in soft restraints. Will evaluate throughout day for possible removal.  Psych following.  TOC working on safe discharge dispo with family.   Problem: Education: Goal: Knowledge of General Education information will improve Description: Including pain rating scale, medication(s)/side effects and non-pharmacologic comfort measures Outcome: Progressing   Problem: Health Behavior/Discharge Planning: Goal: Ability to manage health-related needs will improve Outcome: Progressing   Problem: Clinical Measurements: Goal: Ability to maintain clinical measurements within normal limits will improve Outcome: Progressing Goal: Will remain free from infection Outcome: Progressing Goal: Diagnostic test results will improve Outcome: Progressing Goal: Respiratory complications will improve Outcome: Progressing Goal: Cardiovascular complication will be avoided Outcome: Progressing   Problem: Activity: Goal: Risk for activity intolerance will decrease Outcome: Progressing   Problem: Nutrition: Goal: Adequate nutrition will be maintained Outcome: Progressing   Problem: Coping: Goal: Level of anxiety will decrease Outcome: Progressing   Problem: Elimination: Goal: Will not experience complications related to bowel motility Outcome: Progressing Goal: Will not experience complications related to urinary retention Outcome: Progressing   Problem: Pain Managment: Goal: General experience of comfort will improve Outcome: Progressing   Problem: Safety: Goal: Ability to remain free from injury will improve Outcome: Progressing   Problem: Skin Integrity: Goal: Risk for impaired skin integrity will decrease Outcome: Progressing   Problem: Safety: Goal: Non-violent Restraint(s) Outcome: Progressing

## 2021-06-26 NOTE — TOC Progression Note (Signed)
Transition of Care [facility]) - Progression Note    Patient Details  Name: Trevor Cook MRN: [id number] Date of Birth: Jan 13, 1946  [facility] of [facility]) CM/SW Contact  Ross Ludwig, [place] Phone Number: 06/26/2021, 6:17 PM  Clinical Narrative:    Per psych patient's son spoke to [facility], and can return once medically ready for discharge.  CSW to continue to follow patient's progress throughout discharge planning.   Expected Discharge Plan:  (TBD) Barriers to Discharge: Continued Medical Work up  Expected Discharge Plan and Services Expected Discharge Plan:  (TBD)       Living arrangements for the past 2 months:  ([facility])                                       Social Determinants of Health (SDOH) Interventions    Readmission Risk Interventions No flowsheet data found.

## 2021-06-26 NOTE — Progress Notes (Addendum)
Pt attempting to kick sitter and nurse when trying to obtain VS and give medications. Pt was having a break from ankle restraints, they were replaced so that we could continue with care. Pt given full cup of applesauce and water.

## 2021-06-27 ENCOUNTER — Inpatient Hospital Stay (HOSPITAL_COMMUNITY): Payer: Medicare Other

## 2021-06-27 DIAGNOSIS — T17908A Unspecified foreign body in respiratory tract, part unspecified causing other injury, initial encounter: Secondary | ICD-10-CM | POA: Diagnosis not present

## 2021-06-27 LAB — BASIC METABOLIC PANEL
Anion gap: 7 (ref 5–15)
BUN: 18 mg/dL (ref 8–23)
CO2: 26 mmol/L (ref 22–32)
Calcium: 9 mg/dL (ref 8.9–10.3)
Chloride: 105 mmol/L (ref 98–111)
Creatinine, Ser: 0.7 mg/dL (ref 0.61–1.24)
GFR, Estimated: >60 mL/min (ref >60)
Glucose, Bld: 97 mg/dL (ref 70–99)
Potassium: 4.1 mmol/L (ref 3.5–5.1)
Sodium: 138 mmol/L (ref 135–145)

## 2021-06-27 LAB — CBC
HCT: 39.2 % (ref 39.0–52.0)
Hemoglobin: 12.6 g/dL — ABNORMAL LOW (ref 13.0–17.0)
MCH: 32.2 pg (ref 26.0–34.0)
MCHC: 32.1 g/dL (ref 30.0–36.0)
MCV: 100.3 fL — ABNORMAL HIGH (ref 80.0–100.0)
Platelets: 290 10*3/uL (ref 150–400)
RBC: 3.91 MIL/uL — ABNORMAL LOW (ref 4.22–5.81)
RDW: 12.5 % (ref 11.5–15.5)
WBC: 7.5 10*3/uL (ref 4.0–10.5)
nRBC: 0 % (ref 0.0–0.2)

## 2021-06-27 MED ORDER — SACCHAROMYCES BOULARDII 250 MG PO CAPS
250.0000 mg | ORAL_CAPSULE | Freq: Two times a day (BID) | ORAL | Status: DC
Start: 2021-06-27 — End: 2021-07-11
  Administered 2021-06-28 – 2021-07-11 (×25): 250 mg via ORAL
  Filled 2021-06-27 (×22): qty 1

## 2021-06-27 MED ORDER — SENNOSIDES-DOCUSATE SODIUM 8.6-50 MG PO TABS: 1.0000 | ORAL_TABLET | Freq: Two times a day (BID) | ORAL | Status: DC

## 2021-06-27 MED ORDER — BISACODYL 10 MG RE SUPP: 10.0000 mg | Freq: Every day | RECTAL | Status: DC | PRN

## 2021-06-27 MED ORDER — LOPERAMIDE HCL 2 MG PO CAPS
2.0000 mg | ORAL_CAPSULE | Freq: Once | ORAL | Status: AC
  Administered 2021-06-27: 2 mg via ORAL
  Filled 2021-06-27: qty 1

## 2021-06-27 MED ORDER — PNEUMOCOCCAL VAC POLYVALENT 25 MCG/0.5ML IJ INJ
0.5000 mL | INJECTION | INTRAMUSCULAR | Status: DC
  Filled 2021-06-27: qty 0.5

## 2021-06-27 MED ORDER — INFLUENZA VAC A&B SA ADJ QUAD 0.5 ML IM PRSY
0.5000 mL | PREFILLED_SYRINGE | INTRAMUSCULAR | Status: DC
  Filled 2021-06-27: qty 0.5

## 2021-06-27 NOTE — Evaluation (Signed)
Physical Therapy Evaluation Patient Details Name: Carthel Castille MRN: [id number] DOB: March 11, 1946 Today's Date: 06/27/2021  History of Present Illness  Pt is a 76 y.o. male who presented to the ED secondary to agitation and aggression. While in the ED, he suffered an aspiration event and was admitted. PMH significant for dementia, bipolar 1 disorder, epilepsy, CVA, hypothyroidism, Wernicke-Korsakoff syndrome.   Clinical Impression  Pt is a 77 y.o. male with above HPI resulting in the deficits listed below (see PT Problem List). Pt very pleasant and cooperative throughout session. Pt performed sit to stand transfers with supervision for safety and ambulated total of ~279ft with HHA progressing to close supervision for ~71ft and no HHA required. Pt reports independence without use of AD at baseline. Pt will benefit from additional PT session while in hospital setting in order to progress toward MOD-I to independent goals of mobility, anticipate no further skilled PT needs upon d/c.        Recommendations for follow up therapy are one component of a multi-disciplinary discharge planning process, led by the attending physician.  Recommendations may be updated based on patient status, additional functional criteria and insurance authorization.  Follow Up Recommendations No PT follow up    Assistance Recommended at Discharge Frequent or constant Supervision/Assistance  Patient can return home with the following  A little help with walking and/or transfers;A little help with bathing/dressing/bathroom;Assistance with cooking/housework;Assistance with feeding;Direct supervision/assist for medications management;Direct supervision/assist for financial management;Assist for transportation;Help with stairs or ramp for entrance    Equipment Recommendations None recommended by PT  Recommendations for Other Services       Functional Status Assessment Patient has had a recent decline in their functional  status and demonstrates the ability to make significant improvements in function in a reasonable and predictable amount of time.     Precautions / Restrictions Precautions Precautions: Fall Restrictions Weight Bearing Restrictions: No      Mobility  Bed Mobility Overal bed mobility: Needs Assistance Bed Mobility: Supine to Sit, Sit to Supine     Supine to sit: Supervision Sit to supine: Supervision   General bed mobility comments: Restraints removed for mobility. supervision for safety with cues for progression of mobility.    Transfers Overall transfer level: Needs assistance Equipment used: None Transfers: Sit to/from Stand Sit to Stand: Supervision           General transfer comment: x2; supervison for safety, pt able to follow commands from therapist for safe OOB mobility    Ambulation/Gait Ambulation/Gait assistance: Min guard, Supervision Gait Distance (Feet): 200 Feet Assistive device: 1 person hand held assist, None Gait Pattern/deviations: Step-through pattern, Decreased stride length Gait velocity: decr     General Gait Details: initially HHA required for stability, able to progress to close supervision ~22ft with no HHA. No overt LOB observed, UE tremors observed throughout.  Stairs            Wheelchair Mobility    Modified Rankin (Stroke Patients Only)       Balance Overall balance assessment: Needs assistance Sitting-balance support: Feet supported Sitting balance-Leahy Scale: Good     Standing balance support: No upper extremity supported, During functional activity, Single extremity supported Standing balance-Leahy Scale: Fair Standing balance comment: pt able to stand at sink with no UE support or hand hygiene                             Pertinent Vitals/Pain Pain Assessment  Pain Assessment: No/denies pain    Home Living Family/patient expects to be discharged to:: Assisted living (memory care unit at [facility] per EMR)                   Additional Comments: Pt reports he does not use AD at baseline. Difficult to determine/confirm PLOF/home environment due to baseline cognitive deficits.    Prior Function Prior Level of Function : Patient poor historian/Family not available             Mobility Comments: Reports he is independent with mobility prior to admission ADLs Comments: States he receives assist with dressing, no caregiver or family present to confirm     Hand Dominance        Extremity/Trunk Assessment   Upper Extremity Assessment Upper Extremity Assessment: Overall WFL for tasks assessed (UE tremors observed throughout session)    Lower Extremity Assessment Lower Extremity Assessment: Generalized weakness    Cervical / Trunk Assessment Cervical / Trunk Assessment: Normal  Communication   Communication: Expressive difficulties  Cognition Arousal/Alertness: Awake/alert (initially sleeping, able to arouse with auditory stimulus) Behavior During Therapy: WFL for tasks assessed/performed Overall Cognitive Status: History of cognitive impairments - at baseline                                 General Comments: Pt initiallly stating that he was in [place] prior to admission, but ableto recall that he is now in [place], cues required for orientation to hospital.        General Comments      Exercises     Assessment/Plan    PT Assessment Patient needs continued PT services  PT Problem List Decreased strength;Decreased activity tolerance;Decreased balance;Decreased mobility       PT Treatment Interventions DME instruction;Gait training;Stair training;Functional mobility training;Therapeutic activities;Therapeutic exercise;Balance training;Patient/family education    PT Goals (Current goals can be found in the Care Plan section)  Acute Rehab PT Goals Patient Stated Goal: increased mobility- during session pt eager to mobilize and requesting  further ambulation distance PT Goal Formulation: With patient Time For Goal Achievement: 07/11/21 Potential to Achieve Goals: Good    Frequency Min 3X/week     Co-evaluation               AM-PAC PT "6 Clicks" Mobility  Outcome Measure Help needed turning from your back to your side while in a flat bed without using bedrails?: A Little Help needed moving from lying on your back to sitting on the side of a flat bed without using bedrails?: A Little Help needed moving to and from a bed to a chair (including a wheelchair)?: A Little Help needed standing up from a chair using your arms (e.g., wheelchair or bedside chair)?: A Little Help needed to walk in hospital room?: A Little Help needed climbing 3-5 steps with a railing? : A Little 6 Click Score: 18    End of Session Equipment Utilized During Treatment: Gait belt Activity Tolerance: Patient tolerated treatment well Patient left: in bed;with nursing/sitter in room (RN stating pt able to trial time without restraints, sitter present at end of session with doorway open and RN aware.) Nurse Communication: Mobility status PT Visit Diagnosis: Unsteadiness on feet (R26.81);Muscle weakness (generalized) (M62.81)    Time: 2446-2863 PT Time Calculation (min) (ACUTE ONLY): 23 min   Charges:   PT Evaluation $PT Eval Low Complexity: 1 Low PT Treatments $  Therapeutic Activity: 8-22 mins        Lyman Speller PT, DPT  Acute Rehabilitation Services  Office [phone]  06/27/2021, 1:45 PM

## 2021-06-27 NOTE — Plan of Care (Signed)
°  Problem: Clinical Measurements: Goal: Ability to maintain clinical measurements within normal limits will improve Outcome: Progressing Goal: Will remain free from infection Outcome: Progressing Goal: Diagnostic test results will improve Outcome: Progressing Goal: Respiratory complications will improve Outcome: Progressing Goal: Cardiovascular complication will be avoided Outcome: Progressing   Problem: Activity: Goal: Risk for activity intolerance will decrease Outcome: Progressing   Problem: Nutrition: Goal: Adequate nutrition will be maintained Outcome: Progressing   Problem: Elimination: Goal: Will not experience complications related to bowel motility Outcome: Progressing Goal: Will not experience complications related to urinary retention Outcome: Progressing   Problem: Pain Managment: Goal: General experience of comfort will improve Outcome: Progressing   Problem: Safety: Goal: Ability to remain free from injury will improve Outcome: Progressing   Problem: Skin Integrity: Goal: Risk for impaired skin integrity will decrease Outcome: Progressing   Problem: Safety: Goal: Non-violent Restraint(s) Outcome: Progressing

## 2021-06-27 NOTE — Progress Notes (Signed)
Pt calling tech the "n" word over and over while getting washed up and having bed linens changed. When this nurse came in to help change bed, pt began to try to kick and grab. Pt instructed that he cannot be out of his restraints if he continues to be violent. Pt not following commands.

## 2021-06-27 NOTE — Progress Notes (Signed)
PROGRESS NOTE    Trevor Cook  RNH:[id number] DOB: 1945-05-09 DOA: 06/15/2021 PCP: Aida Puffer, MD   Brief Narrative: Trevor Cook is a 76 y.o. male with a history of dementia, bipolar 1 disorder, epilepsy, CVA, hypothyroidism, Wernicke-Korsakoff syndrome. Patient presented secondary to agitation and aggression. While in the ED, he suffered an aspiration event and was admitted. Psychiatry consulted for discharge recommendations.   Assessment and Plan: * Aspiration into airway, initial encounter- (present on admission) No evidence of pneumonia clinically or on imaging. Aspiration of pills. Speech therapy consulted for recommendations. Started empirically on Unasyn. Per chart history, patient has a history of dysphagia. Unasyn discontinued. No changes in respiratory symptoms.   Lethargy Unsure of etiology. Possibly related to Cogentin. No focal findings. Patient with a listed history of epilepsy, not currently on antiepileptic drugs. No witnessed seizure-like activity, but it is possible he could have been post-ictal; EEG obtained without active evidence of seizure activity. Ammonia normal. Patient was found to be on Sinemet as an outpatient which was not restarted on admission. Cogentin discontinued. Lethargy resolved 2/17.  History of CVA (cerebrovascular accident) Not on antiplatelet therapy. Patient possibly with resultant dysphagia. Noted.  Dementia with behavioral disturbance Complicated by underlying diagnosis of Wernicke's disease. Patient recently required hospitalization secondary to behavioral disorder. It appears patient was discharged on Depakote 500 mg TID, Zyprexa 5 mg BID, Zyprexa 2.5 mg BID PRN agitation and Cogentin 0.5 mg BID. Patient with recurrent episodes of agitation requiring restraints. -Psychiatry recommendations: Valproic acid 500 mg in AM/PM and 750 mg at night, Zyprexa 7.5 mg PO BID. Holding lithium. Limit use of restraints. -Delirium  precautions  Wernicke's disease Patient with recent prolonged hospitalization. Secondary to prolonged alcohol use. 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 Stable. Per recent discharge summary, it appears patient was discharged on carbidopa-levodopa 25-100 mg TID -Continue Sinemet  History of alcohol abuse- (present on admission) No longed drinking alcohol per son.  Hypothyroidism- (present on admission) -Continue Synthroid 75 mcg daily  Essential hypertension- (present on admission) Blood pressure persistently elevated. -Start amlodipine 5 mg daily   DVT prophylaxis: Lovenox Code Status:   Code Status: DNR Family Communication: None at bedside Disposition Plan: Discharge to memory care once bed is available.    Consultants:  Psychiatry  Procedures:  None  Antimicrobials: Unasyn    Subjective: Sleepy.  No acute agitation overnight.  Able to come out of the restraints today.  Had an episode of diarrhea today.  Objective: Vitals:   06/26/21 0447 06/26/21 2112 06/27/21 0628 06/27/21 1420  BP: (!) 150/61 (!) 132/98 (!) 137/101 (!) 144/71  Pulse: (!) 52 99 (!) 59 65  Resp: 16 16 16 20   Temp: 98.5 F (36.9 C) 97.6 F (36.4 C)  (!) 97.4 F (36.3 C)  TempSrc: Oral   Oral  SpO2: 96% 100% 100% 98%   General: Appear in mild distress, no Rash; Oral Mucosa Clear, dry. no Abnormal Neck Mass Or lumps, Conjunctiva normal  Cardiovascular: S1 and S2 Present, no Murmur, Respiratory: good respiratory effort, Bilateral Air entry present and CTA, no Crackles, no wheezes Abdomen: Bowel Sound present, Soft and no tenderness Extremities: no Pedal edema Neurology: alert and oriented to self only. affect appropriate. no new focal deficit Gait not checked due to patient safety concerns   Data Reviewed:  I have Reviewed nursing notes, Vitals, and Lab results since pt's last encounter. Pertinent lab results Depakote level, CBC, BMP I  have reviewed the last note  from psychiatric,   Author:  Lynden Oxford, MD [facility] 06/27/2021  8:27 PM   To reach On-call, see care teams to locate the attending and reach out to them via [email or web address]. If 7PM-7AM, please contact night-coverage If you still have difficulty reaching the attending provider, please page the [facility] (Director on Call) for [facility] on amion for assistance.

## 2021-06-27 NOTE — TOC Progression Note (Signed)
Transition of Care [facility]) - Progression Note    Patient Details  Name: Trevor Cook MRN: [id number] Date of Birth: 03-Oct-1945  [facility]) CM/SW Contact  Ida Rogue, [place] Phone Number: 06/27/2021, 3:03 PM  Clinical Narrative:   Left message for Ms Crystal at [facility] to call me back. [phone]. TOC will continue to follow during the course of hospitalization.     Expected Discharge Plan:  (TBD) Barriers to Discharge: Continued Medical Work up  Expected Discharge Plan and Services Expected Discharge Plan:  (TBD)       Living arrangements for the past 2 months:  ([facility])                                       Social Determinants of Health (SDOH) Interventions    Readmission Risk Interventions No flowsheet data found.

## 2021-06-28 NOTE — Progress Notes (Signed)
°   06/28/21 1152  Mobility  Activity Ambulated with assistance in hallway  Level of Assistance Independent after set-up  Assistive Device Other (Comment) (handheld)  Distance Ambulated (ft) 380 ft  Activity Response Tolerated well  $Mobility charge 1 Mobility   Pt agreeable to mobilize this morning. Ambulated about 352ft in hall with handheld assist/independently, tolerated well. Pt nonverbal, however did not seem to be in pain or discomfort. Left pt in bed, call bell at side. RN/NT notified of session. Sitter present.  Tim[place]o [employer] Mobility Specialist Acute Rehab Services Office: [phone]

## 2021-06-28 NOTE — Progress Notes (Signed)
[facility] PROGRESS NOTE  Patient: Trevor Cook [id number]   PCP: Tamsen Roers, MD DOB: 1946/04/06   DOA: 06/15/2021   DOS: 06/28/2021    Subjective: No nausea no vomiting no fever no chills.  No chest pain abdominal pain.  Ambulating in the hallway multiple times during the day.  Objective:  Vitals:   06/28/21 1339 06/28/21 2030  BP: 129/68 (!) 135/55  Pulse: 66 71  Resp: 16 17  Temp:  98.6 F (37 C)  SpO2: 97% 97%    Clear to auscultation. S1-S2 present. Bowel sound present. Oral mucosa dry.  Assessment and plan: Dementia with behavioral disturbances.  Can continue sitter.  Continue current management. Not a candidate for discharging back to home.  Currently awaiting locked memory care unit placement.  Author: Berle Mull, MD [facility] 06/28/2021 9:01 PM   If 7PM-7AM, please contact night-coverage at [email or web address]

## 2021-06-28 NOTE — TOC Progression Note (Signed)
Transition of Care [facility]) - Progression Note    Patient Details  Name: Trevor Cook MRN: [id number] Date of Birth: Nov 27, 1945  Transition of Care [facility]) CM/SW Contact  Ida Rogue, [place] Phone Number: 06/28/2021, 2:50 PM  Clinical Narrative:   Sherron Monday wto son who confirmed he had spoken with [name] at [facility] who is willing to interview patient to assess for appropriateness for return to facility.  Spoke with [name] who will interview patient virtually today at 3P.  She asked that I email her MAR. Sent. TOC will continue to follow during the course of hospitalization.      Expected Discharge Plan:  (TBD) Barriers to Discharge: Continued Medical Work up  Expected Discharge Plan and Services Expected Discharge Plan:  (TBD)       Living arrangements for the past 2 months:  ([facility])                                       Social Determinants of Health (SDOH) Interventions    Readmission Risk Interventions No flowsheet data found.

## 2021-06-28 NOTE — Plan of Care (Signed)
Pt responding well to commands. No agitation or being combative this shift. Problem: Education: Goal: Knowledge of General Education information will improve Description: Including pain rating scale, medication(s)/side effects and non-pharmacologic comfort measures Outcome: Progressing   Problem: Health Behavior/Discharge Planning: Goal: Ability to manage health-related needs will improve Outcome: Progressing   Problem: Clinical Measurements: Goal: Ability to maintain clinical measurements within normal limits will improve Outcome: Progressing Goal: Will remain free from infection Outcome: Progressing Goal: Diagnostic test results will improve Outcome: Progressing Goal: Respiratory complications will improve Outcome: Progressing Goal: Cardiovascular complication will be avoided Outcome: Progressing   Problem: Activity: Goal: Risk for activity intolerance will decrease Outcome: Progressing   Problem: Nutrition: Goal: Adequate nutrition will be maintained Outcome: Progressing   Problem: Coping: Goal: Level of anxiety will decrease Outcome: Progressing   Problem: Elimination: Goal: Will not experience complications related to bowel motility Outcome: Progressing Goal: Will not experience complications related to urinary retention Outcome: Progressing   Problem: Pain Managment: Goal: General experience of comfort will improve Outcome: Progressing   Problem: Safety: Goal: Ability to remain free from injury will improve Outcome: Progressing   Problem: Skin Integrity: Goal: Risk for impaired skin integrity will decrease Outcome: Progressing   Problem: Safety: Goal: Non-violent Restraint(s) Outcome: Progressing

## 2021-06-29 ENCOUNTER — Other Ambulatory Visit: Payer: Self-pay

## 2021-06-29 NOTE — Progress Notes (Signed)
[facility] PROGRESS NOTE  Patient: Trevor Cook [id number]   PCP: Aida Puffer, MD DOB: 07/14/1945   DOA: 06/15/2021   DOS: 06/29/2021    Subjective: Ambulating in the hallway.  No nausea no vomiting.  No acute behavioral issues.    Objective:  Vitals:   06/29/21 0509 06/29/21 1213  BP: (!) 164/84 139/78  Pulse: 72 74  Resp: 18 20  Temp: 97.6 F (36.4 C) 98.2 F (36.8 C)  SpO2: 99% 97%  Clear to auscultation.  S1-S2 present. Bowel sound present.  Oral mucosa moist.  Assessment and plan: Dementia with behavioral issues. Corporate investment banker. Continue current management with medication.  Author: Lynden Oxford, MD [facility] 06/29/2021 6:52 PM   If 7PM-7AM, please contact night-coverage at [email or web address]

## 2021-06-29 NOTE — TOC Progression Note (Signed)
Transition of Care [facility]) - Progression Note    Patient Details  Name: Trevor Cook MRN: [id number] Date of Birth: 11/06/1945  Transition of Care [facility]) CM/SW Contact  Ida Rogue, [place] Phone Number: 06/29/2021, 3:04 PM  Clinical Narrative:   Spoke with [name], administrator at [facility]. [phone] She interviewed patient remotely yesterday, sent her findings to corporate today and says she will know by Monday what the verdict is on patient returning. TOC will continue to follow during the course of hospitalization.     Expected Discharge Plan:  (TBD) Barriers to Discharge: Continued Medical Work up  Expected Discharge Plan and Services Expected Discharge Plan:  (TBD)       Living arrangements for the past 2 months:  ([facility])                                       Social Determinants of Health (SDOH) Interventions    Readmission Risk Interventions No flowsheet data found.

## 2021-06-29 NOTE — Progress Notes (Signed)
Physical Therapy Treatment and Discharge from Acute PT Patient Details Name: Trevor Cook MRN: [id number] DOB: Oct 20, 1945 Today's Date: 06/29/2021   History of Present Illness Pt is a 76 y.o. male who presented to the ED secondary to agitation and aggression. While in the ED, he suffered an aspiration event and was admitted. PMH significant for dementia, bipolar 1 disorder, epilepsy, CVA, hypothyroidism, Wernicke-Korsakoff syndrome.    PT Comments    Pt able to mobilize around room independently.  Pt also ambulated in hallway without assistive device and had no LOB.  Pt currently with sitter in room who also reports pt is up ambulating in hallways multiple times a day.  Pt no longer requires skilled PT at this time.  PT to sign off.   Recommendations for follow up therapy are one component of a multi-disciplinary discharge planning process, led by the attending physician.  Recommendations may be updated based on patient status, additional functional criteria and insurance authorization.  Follow Up Recommendations  No PT follow up     Assistance Recommended at Discharge    Patient can return home with the following A little help with walking and/or transfers;A little help with bathing/dressing/bathroom;Assistance with cooking/housework;Direct supervision/assist for medications management;Direct supervision/assist for financial management;Assist for transportation;Help with stairs or ramp for entrance;Assistance with feeding   Equipment Recommendations  None recommended by PT    Recommendations for Other Services       Precautions / Restrictions Precautions Precautions: Fall     Mobility  Bed Mobility Overal bed mobility: Modified Independent                  Transfers Overall transfer level: Needs assistance Equipment used: None   Sit to Stand: Supervision                Ambulation/Gait Ambulation/Gait assistance: Supervision Gait Distance (Feet): 400  Feet Assistive device: None Gait Pattern/deviations: Step-through pattern, Decreased stride length Gait velocity: decr     General Gait Details: no assistive device utilized   Chief Strategy Officer    Modified Rankin (Stroke Patients Only)       Balance Overall balance assessment: Mild deficits observed, not formally tested         Standing balance support: No upper extremity supported, During functional activity Standing balance-Leahy Scale: Fair                              Cognition Arousal/Alertness: Awake/alert Behavior During Therapy: WFL for tasks assessed/performed Overall Cognitive Status: History of cognitive impairments - at baseline                                 General Comments: minimal verbalizations today however did express need to have BM end of session        Exercises      General Comments        Pertinent Vitals/Pain Pain Assessment Pain Assessment: Faces Faces Pain Scale: No hurt    Home Living                          Prior Function            PT Goals (current goals can now be found in the care plan section) Progress towards PT goals: Goals met/education  completed, patient discharged from PT    Frequency           PT Plan Other (comment) (d/c from PT)    Co-evaluation              AM-PAC PT "6 Clicks" Mobility   Outcome Measure  Help needed turning from your back to your side while in a flat bed without using bedrails?: None Help needed moving from lying on your back to sitting on the side of a flat bed without using bedrails?: None Help needed moving to and from a bed to a chair (including a wheelchair)?: A Little Help needed standing up from a chair using your arms (e.g., wheelchair or bedside chair)?: A Little Help needed to walk in hospital room?: A Little Help needed climbing 3-5 steps with a railing? : A Little 6 Click Score: 20    End of  Session   Activity Tolerance: Patient tolerated treatment well Patient left:  (in bathroom, sitter in room) Nurse Communication: Mobility status PT Visit Diagnosis: Difficulty in walking, not elsewhere classified (R26.2)     Time: 8590-9311 PT Time Calculation (min) (ACUTE ONLY): 20 min  Charges:  $Gait Training: 8-22 mins                     Arlyce Dice, DPT Acute Rehabilitation Services Pager: [phone] Office: [place] 06/29/2021, 1:18 PM

## 2021-06-30 NOTE — Progress Notes (Signed)
[facility] PROGRESS NOTE  Patient: Trevor Cook [id number]:[id number]   PCP: Aida Puffer, MD DOB: 12/09/1945   DOA: 06/15/2021   DOS: 06/30/2021    Subjective: No nausea no vomiting no fever no chills.  Seen in the hallway ambulating.  Objective:  Vitals:   06/29/21 2358 06/30/21 1400  BP: (!) 151/69 132/64  Pulse: 66 66  Resp:  18  Temp: 97.9 F (36.6 C) 98.4 F (36.9 C)  SpO2: 96% 100%    Assessment and plan: Currently awaiting placement at a locked memory care unit.  Medically stable.  Author: Lynden Oxford, MD [facility] 06/30/2021 8:36 PM   If 7PM-7AM, please contact night-coverage at [email or web address]

## 2021-06-30 NOTE — Progress Notes (Signed)
°   06/30/21 1200  Mobility  Activity Ambulated with assistance in hallway  Level of Assistance Standby assist, set-up cues, supervision of patient - no hands on  Assistive Device None  Distance Ambulated (ft) 450 ft  Activity Response Tolerated well  $Mobility charge 1 Mobility   Pt agreeable to mobilize this afternoon. Ambulated about 42ft in hall with no AD, tolerated well. No complaints. Left pt on BSC in shower with sitter present, call bell at side. RN/NT notified of session.   Timoteo Expose Mobility Specialist Acute Rehab Services Office: [phone]

## 2021-06-30 NOTE — Plan of Care (Signed)
Pt aox1. Cooperative with staff this date.  Safety sitter remains at bedside. Ambulatory in hallway with supervision of sitter.  Medically ready for D/C. TOC working on discharge dispo.    Problem: Education: Goal: Knowledge of General Education information will improve Description: Including pain rating scale, medication(s)/side effects and non-pharmacologic comfort measures Outcome: Progressing   Problem: Health Behavior/Discharge Planning: Goal: Ability to manage health-related needs will improve Outcome: Progressing   Problem: Clinical Measurements: Goal: Ability to maintain clinical measurements within normal limits will improve Outcome: Progressing Goal: Will remain free from infection Outcome: Progressing Goal: Diagnostic test results will improve Outcome: Progressing Goal: Respiratory complications will improve Outcome: Progressing Goal: Cardiovascular complication will be avoided Outcome: Progressing   Problem: Activity: Goal: Risk for activity intolerance will decrease Outcome: Progressing   Problem: Nutrition: Goal: Adequate nutrition will be maintained Outcome: Progressing   Problem: Coping: Goal: Level of anxiety will decrease Outcome: Progressing   Problem: Elimination: Goal: Will not experience complications related to bowel motility Outcome: Progressing Goal: Will not experience complications related to urinary retention Outcome: Progressing   Problem: Pain Managment: Goal: General experience of comfort will improve Outcome: Progressing   Problem: Safety: Goal: Ability to remain free from injury will improve Outcome: Progressing   Problem: Skin Integrity: Goal: Risk for impaired skin integrity will decrease Outcome: Progressing   Problem: Safety: Goal: Non-violent Restraint(s) Outcome: Progressing

## 2021-07-01 MED ORDER — NAPHAZOLINE-GLYCERIN 0.012-0.25 % OP SOLN
1.0000 [drp] | Freq: Four times a day (QID) | OPHTHALMIC | Status: DC | PRN
  Filled 2021-07-01: qty 15

## 2021-07-01 NOTE — Progress Notes (Signed)
[facility] PROGRESS NOTE  Patient: Trevor Cook [id number]   PCP: Tamsen Roers, MD DOB: Nov 17, 1945   DOA: 06/15/2021   DOS: 07/01/2021    Subjective: No nausea or vomiting.  No acute events overnight.  Seen ambulating in the hall multiple times.  Objective:  Vitals:   07/01/21 0627 07/01/21 1320  BP: 140/64 (!) 99/48  Pulse: (!) 52 85  Resp: 18 20  Temp: 98.2 F (36.8 C) 98.1 F (36.7 C)  SpO2: 97% 100%    Assessment and plan: Encourage hydration. Currently awaiting placement in a memory care unit. Medically stable.  Author: Berle Mull, MD [facility] 07/01/2021 8:01 PM   If 7PM-7AM, please contact night-coverage at [email or web address]

## 2021-07-01 NOTE — Plan of Care (Signed)
Pt aox1. Cooperative with staff this date.  Safety sitter remains at bedside. Ambulatory in hallway with supervision of sitter.  Medically ready for D/C. TOC working on discharge dispo.   Problem: Education: Goal: Knowledge of General Education information will improve Description: Including pain rating scale, medication(s)/side effects and non-pharmacologic comfort measures Outcome: Progressing   Problem: Health Behavior/Discharge Planning: Goal: Ability to manage health-related needs will improve Outcome: Progressing   Problem: Clinical Measurements: Goal: Ability to maintain clinical measurements within normal limits will improve Outcome: Progressing Goal: Will remain free from infection Outcome: Progressing Goal: Diagnostic test results will improve Outcome: Progressing Goal: Respiratory complications will improve Outcome: Progressing Goal: Cardiovascular complication will be avoided Outcome: Progressing   Problem: Activity: Goal: Risk for activity intolerance will decrease Outcome: Progressing   Problem: Nutrition: Goal: Adequate nutrition will be maintained Outcome: Progressing   Problem: Coping: Goal: Level of anxiety will decrease Outcome: Progressing   Problem: Elimination: Goal: Will not experience complications related to bowel motility Outcome: Progressing Goal: Will not experience complications related to urinary retention Outcome: Progressing   Problem: Pain Managment: Goal: General experience of comfort will improve Outcome: Progressing   Problem: Safety: Goal: Ability to remain free from injury will improve Outcome: Progressing   Problem: Skin Integrity: Goal: Risk for impaired skin integrity will decrease Outcome: Progressing   Problem: Safety: Goal: Non-violent Restraint(s) Outcome: Progressing

## 2021-07-01 NOTE — Progress Notes (Signed)
Pt is still walking the halls with supervision of sitter, recognizes his own room, carries on conversation. When asked about his sons visit today - he told me about the visit and that his son is a Administrator. The scelera of Johns eyes appear reddened - will request visine type eye drops.

## 2021-07-02 NOTE — Progress Notes (Signed)
Pt awoke about 0225 and was walking the hall dazed - would not listen to the sitter about applying a mask, Trevor Cook stopped at nurses station where he saw me, as pt was drooling and his sitter tried to wipe his drool and Trevor Cook swatted his hand away , preceded to walk down the hallway urinating. Attempts to redirect were unsuccessful. Trevor Cook was aggressive towards staff, not following directions. Once he returned to his bed , snacks and water were offered . When I tried to offer water he swatted it away spilling water on me & the floor. Verbal reminders to "be nice" & "no hitting allowed' were used. Showed him the restraints that could be reapplied if he continued to be aggressive. Lights turned off and relaxation TV turned on.

## 2021-07-02 NOTE — Progress Notes (Signed)
[facility] PROGRESS NOTE  Patient: Trevor Cook [id number]   PCP: Tamsen Roers, MD DOB: 06-24-1945   DOA: 06/15/2021   DOS: 07/02/2021    Subjective: No nausea no vomiting no fever no chills.  No chest pain abdominal pain.  Objective:  Vitals:   07/02/21 1452 07/02/21 1904  BP: 132/61 (!) 132/96  Pulse: 63 65  Resp: 19 20  Temp: 98.4 F (36.9 C) 98.3 F (36.8 C)  SpO2: 100% 99%  S1-S2 present. Clear to auscultation. Bowel sound present. No tenderness.  Assessment and plan: Continue current care.  Author: Berle Mull, MD [facility] 07/02/2021 7:15 PM   If 7PM-7AM, please contact night-coverage at [email or web address]

## 2021-07-02 NOTE — TOC Progression Note (Signed)
Transition of Care [facility]) - Progression Note    Patient Details  Name: Trevor Cook MRN: [id number] Date of Birth: 28-Sep-1945  Transition of Care [facility]) CM/SW Contact  Darleene Cleaver, [place] Phone Number: 07/02/2021, 12:48 PM  Clinical Narrative:     CSW spoke to resident care manager Crystal to follow up on if patient can return back to [facility].  Per [employer] the administrator, who completed the interview with patient on Thursday the 23rd is out of the office today.  Crystal will try to contact her and let CSW know what the decision about patient returning to [facility] memory care ALF.  Crystal said she will call CSW back once she hears something.   Expected Discharge Plan:  (TBD) Barriers to Discharge: Continued Medical Work up  Expected Discharge Plan and Services Expected Discharge Plan:  (TBD)       Living arrangements for the past 2 months:  ([facility])                                       Social Determinants of Health (SDOH) Interventions    Readmission Risk Interventions No flowsheet data found.

## 2021-07-02 NOTE — Plan of Care (Signed)
Pt aox1. Cooperative with staff this date.  °Safety sitter remains at bedside. Ambulatory in hallway with supervision of sitter.  °Medically ready for D/C. °TOC working on discharge dispo.  ° ° °Problem: Education: °Goal: Knowledge of General Education information will improve °Description: Including pain rating scale, medication(s)/side effects and non-pharmacologic comfort measures °Outcome: Progressing °  °Problem: Health Behavior/Discharge Planning: °Goal: Ability to manage health-related needs will improve °Outcome: Progressing °  °Problem: Clinical Measurements: °Goal: Ability to maintain clinical measurements within normal limits will improve °Outcome: Progressing °Goal: Will remain free from infection °Outcome: Progressing °Goal: Diagnostic test results will improve °Outcome: Progressing °Goal: Respiratory complications will improve °Outcome: Progressing °Goal: Cardiovascular complication will be avoided °Outcome: Progressing °  °Problem: Activity: °Goal: Risk for activity intolerance will decrease °Outcome: Progressing °  °Problem: Nutrition: °Goal: Adequate nutrition will be maintained °Outcome: Progressing °  °Problem: Coping: °Goal: Level of anxiety will decrease °Outcome: Progressing °  °Problem: Elimination: °Goal: Will not experience complications related to bowel motility °Outcome: Progressing °Goal: Will not experience complications related to urinary retention °Outcome: Progressing °  °Problem: Pain Managment: °Goal: General experience of comfort will improve °Outcome: Progressing °  °Problem: Safety: °Goal: Ability to remain free from injury will improve °Outcome: Progressing °  °Problem: Skin Integrity: °Goal: Risk for impaired skin integrity will decrease °Outcome: Progressing °  °Problem: Safety: °Goal: Non-violent Restraint(s) °Outcome: Progressing °  °

## 2021-07-02 NOTE — Progress Notes (Signed)
Speech Language Pathology Treatment: Dysphagia  Patient Details Name: Trevor Cook MRN: [id number] DOB: Apr 23, 1946 Today's Date: 07/02/2021 Time: 1455-1510 SLP Time Calculation (min) (ACUTE ONLY): 15 min  Assessment / Plan / Recommendation Clinical Impression  Patient seen by SLP for skilled treatment session focused on dysphagia goals. Per RN, patient has been tolerating meals (Dys 2, thin) well and although he can feed himself, he will often request to be fed. When SLP arrived, patient lying in bed but awake. He was calm and cooperative. He sat edge of bed and SLP observed him consume graham crackers, ice cream and drink thin liquids (water) via straw sips. Mildly prolonged mastication but no oral residuals observed no immediate coughing or throat clearing. He was impulsive with PO intake when feeding self graham crackers. He exhibited one brief delayed throat clear which occured when he laid down immediately after finishing PO's. SLP is recommending to upgrade to Dys 3 solids, continue with thin liquids. No further SLP services needed at this venue of care.    HPI HPI: Patient is a 76 y.o. male with PMH: alcohol abuse, metabolic encephalopathy, alcohol induced persisting dementia, Wernicke's encephalopathy, CVA and hypothyroidism who presented to [facility] ED from [facility] for agitation where it was reported he punched a wall after demanding to leave. In ED, patient alert and oriented only to self, unable to follow commands, making sounds and primative speech. He was made NPO pending speech swallow evaluation.  Follow up indicated to assure po tolerance and assess indication for instrumental swallow evaluation.      SLP Plan  Discharge SLP treatment due to (comment);All goals met      Recommendations for follow up therapy are one component of a multi-disciplinary discharge planning process, led by the attending physician.  Recommendations may be updated based on patient status, additional  functional criteria and insurance authorization.    Recommendations  Diet recommendations: Dysphagia 3 (mechanical soft);Thin liquid Liquids provided via: Cup;Straw Medication Administration: Whole meds with puree Supervision: Staff to assist with self feeding;Full supervision/cueing for compensatory strategies;Patient able to self feed Compensations: Slow rate;Small sips/bites;Minimize environmental distractions Postural Changes and/or Swallow Maneuvers: Seated upright 90 degrees;Upright 30-60 min after meal                Oral Care Recommendations: Oral care BID;Staff/trained caregiver to provide oral care Follow Up Recommendations: No SLP follow up Assistance recommended at discharge: None SLP Visit Diagnosis: Dysphagia, unspecified (R13.10);Dysphagia, oral phase (R13.11) Plan: Discharge SLP treatment due to (comment);All goals met           Sonia Baller, MA, CCC-SLP Speech Therapy

## 2021-07-02 NOTE — Progress Notes (Addendum)
Kelvin had another episode of agitation, aggression and was difficult to redirect. Grunting not forming words to communicate needs leaving staff to guess what he needs and not swallowing his drool.  Refused lab to draw any blood, got more agitated as they were at the doorway.  Secure chat sent to the on call provider - her response was - "noted"

## 2021-07-03 DIAGNOSIS — T17908A Unspecified foreign body in respiratory tract, part unspecified causing other injury, initial encounter: Secondary | ICD-10-CM | POA: Diagnosis not present

## 2021-07-03 NOTE — Progress Notes (Signed)
Progress Note   Patient: Trevor Cook [id number]:[id number] DOB: 08/03/1945 DOA: 06/15/2021     Hospitalization day: 13 DOS: the patient was seen and examined on 07/03/2021   Brief hospital course: Trevor Cook is a 76 y.o. male with a history of dementia, bipolar 1 disorder, epilepsy, CVA, hypothyroidism, Wernicke-Korsakoff syndrome. Patient presented secondary to agitation and aggression. While in the ED, he suffered an aspiration event and was admitted. Psychiatry consulted for discharge recommendations. Medically stable as of 2/28 to be transferred to memory care unit at ALF.  Assessment and Plan: * Aspiration into airway, initial encounter- (present on admission) Patient presented secondary to agitation and aggression. While in the ED, he suffered an aspiration event and was admitted. No evidence of pneumonia clinically or on imaging. Started empirically on Unasyn. Speech therapy consulted Per chart history, patient has a history of dysphagia. Unasyn discontinued. Speech therapy recommends dysphagia 3 diet thin liquid, assist with feeding.  Lethargy- (present on admission) Unsure of etiology. Possibly related to Cogentin. No focal findings. Patient with a listed history of epilepsy, not currently on antiepileptic drugs. No witnessed seizure-like activity, but it is possible he could have been post-ictal; EEG obtained without active evidence of seizure activity. Ammonia normal. Patient was found to be on Sinemet as an outpatient which was not restarted on admission. Cogentin discontinued. Lethargy resolved 2/17.  Dementia with behavioral disturbance- (present on admission) Complicated by underlying diagnosis of Wernicke's Korsakoff disease. Patient recently required hospitalization secondary to behavioral disorder. It appears patient was discharged on Depakote 500 mg TID, Zyprexa 5 mg BID, Zyprexa 2.5 mg BID PRN agitation and Cogentin 0.5 mg BID.  Patient presented with agitation and  aggression. Intermittently requiring restrained due to agitation. Has been out of the restraints since 2/22. Ambulating multiple times in the hallway with a sitter. Currently plan is to transition to locked memory care unit. -Psychiatry was consulted, recommendations: Zyprexa 7.5 mg twice daily,  Depakote 500 mg a.m. p.m. and 750 mg at night. Cogentin discontinued.  Lithium discontinued.  Wernicke's disease- (present on admission) Patient with recent prolonged hospitalization. Secondary to prolonged alcohol use. 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 Stable Continue carbidopa-levodopa 25-100 mg TID  History of CVA (cerebrovascular accident) Not on antiplatelet therapy. Patient possibly with resultant dysphagia. Noted.  Hypothyroidism- (present on admission) -Continue Synthroid 75 mcg daily  Essential hypertension- (present on admission) Blood pressure now stable. Continue Norvasc.  Subjective: No nausea no vomiting.  No fever no chills.  Walking in the hallway multiple times during the day.  Hydrating himself.  Somewhat agitated last night as was not able to feed himself and was frustrated with tremors.  Physical Exam: Vitals:   07/01/21 2157 07/02/21 1452 07/02/21 1904 07/03/21 1501  BP: (!) 164/88 132/61 (!) 132/96 128/67  Pulse: 64 63 65 66  Resp: 19 19 20 18   Temp: 97.8 F (36.6 C) 98.4 F (36.9 C) 98.3 F (36.8 C) 97.8 F (36.6 C)  TempSrc: Oral Axillary Oral Oral  SpO2: 99% 100% 99% 97%   General: Appear in mild distress; no visible Abnormal Neck Mass Or lumps, Conjunctiva normal Cardiovascular: S1 and S2 Present, no Murmur, Respiratory: good respiratory effort, Bilateral Air entry present and CTA, no Crackles, no wheezes Abdomen: Bowel Sound present Extremities: no Pedal edema Neurology: alert and oriented to self. Gait not checked due to patient safety concerns   Data Reviewed:  I have ordered test including  CBC and BMP    Family Communication:  None at bedside.  Disposition: Status is: Inpatient Remains inpatient appropriate because: Unsafe discharge.  Requires one-to-one sitter.  Need memory care unit  Author: Lynden Oxford, MD 07/03/2021 6:19 PM  For on call review [email or web address].

## 2021-07-03 NOTE — TOC Progression Note (Addendum)
Transition of Care [facility]) - Progression Note    Patient Details  Name: Trevor Cook MRN: [id number] Date of Birth: 16-Apr-1946  [facility]) CM/SW Contact  Darleene Cleaver, [place] Phone Number: 07/03/2021, 12:34 PM  Clinical Narrative:     CSW attempted to contact administrator Trevor Cook at [place], [phone] had to leave a message on voice mail.  4:03pm  CSW tried calling [facility] again at [phone], left another voice mail waiting for a call back.   Expected Discharge Plan:  (TBD) Barriers to Discharge: Continued Medical Work up  Expected Discharge Plan and Services Expected Discharge Plan:  (TBD)       Living arrangements for the past 2 months:  ([facility])                                       Social Determinants of Health (SDOH) Interventions    Readmission Risk Interventions No flowsheet data found.

## 2021-07-03 NOTE — Progress Notes (Signed)
At 2115 last night when I entered room for assessment and to give Trevor Cook his meds- he took the pills easily but then kept saying to me " fucking Bitch" so I left the room to avoid any further agitation.  The sitter at the bedside did not report any further concerns, however [name] did not leave his room to do any hallway walking like he normally does. Will cont to monitor and attempt giving  6am meds

## 2021-07-03 NOTE — Progress Notes (Signed)
°   07/03/21 1200  Mobility  Activity Ambulated with assistance in hallway  Level of Assistance Standby assist, set-up cues, supervision of patient - no hands on  Assistive Device None  Distance Ambulated (ft) 1100 ft  Activity Response Tolerated well  $Mobility charge 1 Mobility   Pt agreeable to mobilize this morning. Ambulated about 1153ft in hall with no AD, tolerated well. No complaints. Left pt in bed, call bell at side. RN/NT notified of session. Sitter present.  Timoteo Expose Mobility Specialist Acute Rehab Services Office: [phone]

## 2021-07-04 DIAGNOSIS — T17908A Unspecified foreign body in respiratory tract, part unspecified causing other injury, initial encounter: Secondary | ICD-10-CM | POA: Diagnosis not present

## 2021-07-04 LAB — BASIC METABOLIC PANEL
Anion gap: 5 (ref 5–15)
BUN: 15 mg/dL (ref 8–23)
CO2: 28 mmol/L (ref 22–32)
Calcium: 8.9 mg/dL (ref 8.9–10.3)
Chloride: 105 mmol/L (ref 98–111)
Creatinine, Ser: 0.78 mg/dL (ref 0.61–1.24)
GFR, Estimated: >60 mL/min (ref >60)
Glucose, Bld: 83 mg/dL (ref 70–99)
Potassium: 4.3 mmol/L (ref 3.5–5.1)
Sodium: 138 mmol/L (ref 135–145)

## 2021-07-04 LAB — CBC
HCT: 39.1 % (ref 39.0–52.0)
Hemoglobin: 12.4 g/dL — ABNORMAL LOW (ref 13.0–17.0)
MCH: 32.1 pg (ref 26.0–34.0)
MCHC: 31.7 g/dL (ref 30.0–36.0)
MCV: 101.3 fL — ABNORMAL HIGH (ref 80.0–100.0)
Platelets: 246 10*3/uL (ref 150–400)
RBC: 3.86 MIL/uL — ABNORMAL LOW (ref 4.22–5.81)
RDW: 12.5 % (ref 11.5–15.5)
WBC: 6.7 10*3/uL (ref 4.0–10.5)
nRBC: 0 % (ref 0.0–0.2)

## 2021-07-04 MED ORDER — DIVALPROEX SODIUM 500 MG PO DR TAB: 500.0000 mg | DELAYED_RELEASE_TABLET | Freq: Two times a day (BID) | ORAL | 0 refills | Status: DC

## 2021-07-04 MED ORDER — DIVALPROEX SODIUM 250 MG PO DR TAB: 750.0000 mg | DELAYED_RELEASE_TABLET | Freq: Every day | ORAL | 0 refills | Status: DC

## 2021-07-04 MED ORDER — OLANZAPINE 7.5 MG PO TABS: 7.5000 mg | ORAL_TABLET | Freq: Two times a day (BID) | ORAL | 0 refills | Status: DC

## 2021-07-04 MED ORDER — BENZTROPINE MESYLATE 0.5 MG PO TABS: 0.5000 mg | ORAL_TABLET | Freq: Two times a day (BID) | ORAL | Status: DC

## 2021-07-04 MED ORDER — ACETAMINOPHEN 325 MG PO TABS: 650.0000 mg | ORAL_TABLET | Freq: Four times a day (QID) | ORAL | Status: AC | PRN

## 2021-07-04 MED ORDER — AMLODIPINE BESYLATE 5 MG PO TABS: 5.0000 mg | ORAL_TABLET | Freq: Every day | ORAL | Status: DC

## 2021-07-04 MED ORDER — MEMANTINE HCL 5 MG PO TABS: 5.0000 mg | ORAL_TABLET | Freq: Every day | ORAL | 0 refills | Status: DC

## 2021-07-04 NOTE — [place] FL2 (Signed)
?[place] MEDICAID FL2 LEVEL OF CARE SCREENING TOOL  ?  ? ?IDENTIFICATION  ?Patient Name: ?Trevor Cook Birthdate: 21-Apr-1946 Sex: male Admission Date (Current Location): ?06/15/2021  ?[place]: ? [place] ?  Facility and Address:  ?[facility],  [address], [place] [address] ?     Provider Number: ?[id number]  ?Attending Physician Name and Address:  ?Rhetta Mura, MD ? Relative Name and Phone Number:  ?  ?   ?Current Level of Care: ?Hospital Recommended Level of Care: ?Memory Care Prior Approval Number: ?  ? ?Date Approved/Denied: ?  PASRR Number: ?  ? ?Discharge Plan: ?Other (Comment) (memory care at [facility]) ?  ? ?Current Diagnoses: ?Patient Active Problem List  ? Diagnosis Date Noted  ? Lethargy 06/21/2021  ? Wernicke's disease 06/20/2021  ? Dementia with behavioral disturbance 06/20/2021  ? History of CVA (cerebrovascular accident) 06/20/2021  ? Aspiration into airway, initial encounter 06/18/2021  ? Macrocytic anemia 06/18/2021  ? Hyperglycemia 06/18/2021  ? 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HCC) 06/18/2021  ? Acute metabolic encephalopathy 05/13/2020  ? Encephalopathy 05/13/2020  ? Toxic metabolic encephalopathy 05/13/2020  ? Drug-induced parkinsonism (HCC)   ? Fall   ? History of alcohol abuse 04/07/2020  ? Tremor 09/20/2013  ? Essential hypertension 09/20/2013  ? Hypothyroidism 09/20/2013  ? 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 ? ?Orientation RESPIRATION BLADDER Height & Weight   ?  ?Self ? Normal Incontinent Weight:   ?Height:     ?BEHAVIORAL SYMPTOMS/MOOD NEUROLOGICAL BOWEL NUTRITION STATUS  ?    Incontinent Diet (regular and finger foods)  ?AMBULATORY STATUS COMMUNICATION OF NEEDS Skin   ?Limited Assist Verbally Normal ?  ?  ?  ?    ?     ?     ? ? ?Personal Care Assistance Level of Assistance  ?Bathing, Feeding, Dressing Bathing Assistance: Limited assistance ?Feeding assistance: Limited assistance ?Dressing Assistance: Limited assistance ?    ? ?Functional Limitations Info  ?Sight, Hearing, Speech Sight Info: Adequate ?Hearing Info: Adequate ?Speech Info: Adequate  ? ? ?SPECIAL CARE FACTORS FREQUENCY  ?    ?  ?  ?  ?  ?  ?  ?   ? ? ?Contractures Contractures Info: Not present  ? ? ?Additional Factors Info  ?Code Status Code Status Info: DNR ?  ?  ?  ?  ?   ? ?Current Medications (07/04/2021):  This is the current hospital active medication list ?Current Facility-Administered Medications  ?Medication Dose Route Frequency Provider Last Rate Last Admin  ? acetaminophen (TYLENOL) tablet 650 mg  650 mg Oral Q6H PRN Bobette Mo, MD      ? Or  ? acetaminophen (TYLENOL) suppository 650 mg  650 mg Rectal Q6H PRN Bobette Mo, MD      ? amLODipine (NORVASC) tablet 5 mg  5 mg Oral Daily Narda Bonds, MD   5 mg at 07/04/21 1004  ? bisacodyl (DULCOLAX) suppository 10 mg  10 mg Rectal Daily PRN Rolly Salter, MD      ? carbidopa-levodopa (SINEMET IR) 25-100 MG per tablet immediate release 1 tablet  1 tablet Oral TID Maryagnes Amos, FNP   1 tablet at 07/04/21 1004  ? divalproex (DEPAKOTE) DR tablet 500 mg  500 mg Oral BID Maryagnes Amos, FNP   500 mg at 07/04/21 0813  ? And  ? divalproex (DEPAKOTE) DR tablet 750 mg  750 mg Oral QHS Starkes-Perry, Juel Burrow, FNP   750 mg  at 07/03/21 2150  ? enoxaparin (LOVENOX) injection 40 mg  40 mg Subcutaneous Q24H Bobette Mo, MD   40 mg at 07/02/21 1719  ? influenza vaccine adjuvanted (FLUAD) injection 0.5 mL  0.5 mL Intramuscular Tomorrow-1000 Narda Bonds, MD      ? levothyroxine (SYNTHROID) tablet 75 mcg  75 mcg Oral Q0600 Narda Bonds, MD   75 mcg at 07/04/21 4970  ? MEDLINE mouth rinse  15 mL Mouth Rinse BID Narda Bonds, MD   15 mL at 07/03/21 2200  ? memantine (NAMENDA) tablet 5 mg  5 mg Oral QHS Bobette Mo, MD   5 mg at 07/03/21 2150  ? multivitamin with minerals tablet 1 tablet  1 tablet Oral Daily Bobette Mo, MD   1 tablet at 07/04/21 1004  ?  naphazoline-glycerin (CLEAR EYES REDNESS) ophth solution 1-2 drop  1-2 drop Both Eyes QID PRN Jimmye Norman, NP      ? OLANZapine (ZYPREXA) tablet 7.5 mg  7.5 mg Oral BID Maryagnes Amos, FNP   7.5 mg at 07/04/21 1004  ? ondansetron (ZOFRAN) tablet 4 mg  4 mg Oral Q6H PRN Bobette Mo, MD      ? Or  ? ondansetron [facility]) injection 4 mg  4 mg Intravenous Q6H PRN Bobette Mo, MD      ? pneumococcal 23 valent vaccine (PNEUMOVAX-23) injection 0.5 mL  0.5 mL Intramuscular Tomorrow-1000 Narda Bonds, MD      ? saccharomyces boulardii (FLORASTOR) capsule 250 mg  250 mg Oral BID Rolly Salter, MD   250 mg at 07/04/21 1004  ? thiamine tablet 100 mg  100 mg Oral Daily Bobette Mo, MD   100 mg at 07/04/21 1004  ? ? ? ?Discharge Medications: ?Please see discharge summary for a list of discharge medications. ? ?Relevant Imaging Results: ? ?Relevant Lab Results: ? ? ?Additional Information ?SSN; [id number] ? ?Golda Acre, RN ? ? ? ? ?
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
## 2021-07-04 NOTE — TOC Transition Note (Addendum)
Transition of Care (TOC) - CM/SW Discharge Note ? ? ?Patient Details  ?Name: Priest Lockridge ?MRN: [id number] ?Date of Birth: Apr 14, 1946 ? ?Transition of Care [facility]) CM/SW Contact:  ?Golda Acre, RN ?Phone Number: ?07/04/2021, 9:10 AM ? ? ?Clinical Narrative:    ?Tct-Dee at [facility] at [phone]. Messge left for her to call back pt is dcd to return. ?Tct to [place] at [facility] at main number wcb in 20-230 minutes to see if can take patient v=back. ?Fl2 and discharge summary faxed to dee for consideration. ? Per Crystal will not take patient bck off the lithium.  Discussion with md's on case.  Medical director at [facility] to call psychiatrist here.  Number given.  Also requesting severa ldays of notes-faxed to Crystal at [facility]. ?Tct-son-made aware of why fathewr has not returned to the memory care unit. ?Barriers to Discharge: Continued Medical Work up ? ? ?Patient Goals and CMS Choice ?Patient states their goals for this hospitalization and ongoing recovery are:: Unable to answer ?  ?  ? ?Discharge Placement ?  ?           ?  ?  ?  ?  ? ?Discharge Plan and Services ?  ?  ?           ?  ?  ?  ?  ?  ?  ?  ?  ?  ?  ? ?Social Determinants of Health (SDOH) Interventions ?  ? ? ?Readmission Risk Interventions ?No flowsheet data found. ? ? ? ? ?

## 2021-07-04 NOTE — Progress Notes (Signed)
?   07/04/21 1100  ?Mobility  ?Activity Ambulated with assistance in hallway  ?Level of Assistance Standby assist, set-up cues, supervision of patient - no hands on  ?Assistive Device None  ?Distance Ambulated (ft) 1000 ft  ?Activity Response Tolerated well  ?$Mobility charge 1 Mobility  ? ?Pt agreeable to mobilize this morning. Ambulated about 1026ft in hall with no AD, tolerated well. No complaints. Left pt in bed, call bell at side. RN/NT notified of session.  ? ?Timoteo Expose. ?Mobility Specialist ?Acute Rehab Services ?Office: [phone] ? ?

## 2021-07-05 NOTE — Plan of Care (Signed)

## 2021-07-05 NOTE — Progress Notes (Signed)
?   07/05/21 1200  ?Mobility  ?Activity Refused mobility  ? ?Checked on pt twice. Both times, pt was asleep. RN stated to let the pt rest today. ? ?Timoteo Expose. ?Mobility Specialist ?Acute Rehab Services ?Office: [phone] ? ?

## 2021-07-05 NOTE — Progress Notes (Signed)
Patient seen and examined in no distress-awaiting discharge to ALF ?Son is aware of barriers to discharge as per social worker ? ?No charge ? ?Pleas Koch, MD ?[facility] ?3:31 PM ? ?

## 2021-07-06 LAB — CREATININE, SERUM
Creatinine, Ser: 0.81 mg/dL (ref 0.61–1.24)
GFR, Estimated: >60 mL/min (ref >60)

## 2021-07-06 MED ORDER — ENOXAPARIN SODIUM 40 MG/0.4ML IJ SOSY
40.0000 mg | PREFILLED_SYRINGE | INTRAMUSCULAR | Status: DC
  Administered 2021-07-06 – 2021-07-10 (×5): 40 mg via SUBCUTANEOUS
  Filled 2021-07-06 (×5): qty 0.4

## 2021-07-06 NOTE — TOC Progression Note (Signed)
Transition of Care (TOC) - Progression Note  ? ? ?Patient Details  ?Name: Trevor Cook ?MRN: [id number] ?Date of Birth: 09-02-1945 ? ?Transition of Care (TOC) CM/SW Contact  ?Ida Rogue, LCSW ?Phone Number: ?07/06/2021, 12:11 PM ? ?Clinical Narrative:   I spoke with [name] at [facility] yesterday who told me they were concerned about patient not being on Lithium, and that they needed their psychiatrist to review.  I sent her all the psych notes via e-mail and gave her psychiatrist number to give to their psychiatrist.  Later, I was informed that our psychiatrist and theirs were unable to speak directly yesterday, and they would try again today.I also spoke with son, who reiterated his belief that his father needs increased medication because of "high tolerance," and that he believes his father would be better served returning to [facility]. [facility] will continue to follow during the course of hospitalization. ?TOC will continue to follow during the course of hospitalization. ? ? ? ? ?Expected Discharge Plan:  (TBD) ?Barriers to Discharge: Continued Medical Work up ? ?Expected Discharge Plan and Services ?Expected Discharge Plan:  (TBD) ?  ?  ?  ?Living arrangements for the past 2 months:  ([facility]) ?Expected Discharge Date: 07/04/21               ?  ?  ?  ?  ?  ?  ?  ?  ?  ?  ? ? ?Social Determinants of Health (SDOH) Interventions ?  ? ?Readmission Risk Interventions ?No flowsheet data found. ? ?

## 2021-07-06 NOTE — Progress Notes (Signed)
This shift pt ambulated hall prior to receiving night meds. Once received pt slept well. No complaints ,AROSE AT 0500 to ambulate hall briefly and returned to room with morning coffee. No agitation or outbursts ?

## 2021-07-06 NOTE — Progress Notes (Addendum)
Spoke to patient's son, he stated that he prefers his father go to [place] and not [place].  ?

## 2021-07-06 NOTE — Progress Notes (Signed)
Patient seen and examined in no distress ?He is ambulatory, hasnt needed a sitter and is euthymic. ? ?Son is aware of barriers to discharge as per social worker ? ?We will see formally again over weekend. ? ?Labs in am ? ? ?Pleas Koch, MD ?[facility] ?4:38 PM ? ?

## 2021-07-06 NOTE — Progress Notes (Signed)
No safety sitter for 7A-1900.  ?

## 2021-07-07 DIAGNOSIS — T17908A Unspecified foreign body in respiratory tract, part unspecified causing other injury, initial encounter: Secondary | ICD-10-CM | POA: Diagnosis not present

## 2021-07-07 LAB — CBC WITH DIFFERENTIAL/PLATELET
Abs Immature Granulocytes: 0.02 10*3/uL (ref 0.00–0.07)
Basophils Absolute: 0.1 10*3/uL (ref 0.0–0.1)
Basophils Relative: 1 %
Eosinophils Absolute: 0.1 10*3/uL (ref 0.0–0.5)
Eosinophils Relative: 1 %
HCT: 36.5 % — ABNORMAL LOW (ref 39.0–52.0)
Hemoglobin: 11.6 g/dL — ABNORMAL LOW (ref 13.0–17.0)
Immature Granulocytes: 0 %
Lymphocytes Relative: 53 %
Lymphs Abs: 3.6 10*3/uL (ref 0.7–4.0)
MCH: 32 pg (ref 26.0–34.0)
MCHC: 31.8 g/dL (ref 30.0–36.0)
MCV: 100.8 fL — ABNORMAL HIGH (ref 80.0–100.0)
Monocytes Absolute: 0.8 10*3/uL (ref 0.1–1.0)
Monocytes Relative: 11 %
Neutro Abs: 2.3 10*3/uL (ref 1.7–7.7)
Neutrophils Relative %: 34 %
Platelets: 205 10*3/uL (ref 150–400)
RBC: 3.62 MIL/uL — ABNORMAL LOW (ref 4.22–5.81)
RDW: 12.3 % (ref 11.5–15.5)
WBC: 6.8 10*3/uL (ref 4.0–10.5)
nRBC: 0 % (ref 0.0–0.2)

## 2021-07-07 LAB — COMPREHENSIVE METABOLIC PANEL
ALT: 13 U/L (ref 0–44)
AST: 21 U/L (ref 15–41)
Albumin: 3.4 g/dL — ABNORMAL LOW (ref 3.5–5.0)
Alkaline Phosphatase: 45 U/L (ref 38–126)
Anion gap: 5 (ref 5–15)
BUN: 17 mg/dL (ref 8–23)
CO2: 30 mmol/L (ref 22–32)
Calcium: 9 mg/dL (ref 8.9–10.3)
Chloride: 101 mmol/L (ref 98–111)
Creatinine, Ser: 0.74 mg/dL (ref 0.61–1.24)
GFR, Estimated: >60 mL/min (ref >60)
Glucose, Bld: 86 mg/dL (ref 70–99)
Potassium: 4 mmol/L (ref 3.5–5.1)
Sodium: 136 mmol/L (ref 135–145)
Total Bilirubin: 0.3 mg/dL (ref 0.3–1.2)
Total Protein: 6 g/dL — ABNORMAL LOW (ref 6.5–8.1)

## 2021-07-07 MED ORDER — CARBIDOPA-LEVODOPA 25-100 MG PO TABS
0.5000 | ORAL_TABLET | Freq: Three times a day (TID) | ORAL | Status: DC
  Administered 2021-07-07 – 2021-07-11 (×12): 0.5 via ORAL
  Filled 2021-07-07 (×12): qty 1

## 2021-07-07 NOTE — Progress Notes (Signed)
?Progress Note ? ? ?Patient: Trevor Cook [id number] DOB: 22-Aug-1945 DOA: 06/15/2021     Hospitalization day: 17 ?DOS: the patient was seen and examined on 07/07/2021 ?  ?Brief hospital course: ? ?76 y.o. male with a history of dementia, bipolar 1 disorder, epilepsy, CVA, hypothyroidism, Wernicke-Korsakoff syndrome.  ?Patient presented secondary to agitation and aggression. While in the ED, he suffered an aspiration event and was admitted. Psychiatry consulted for discharge recommendations. ?Medically stable as of 2/28 to be transferred to memory care unit at ALF. ? ?Psych H/o ?Jan 2021 was less responsive--went to rehab for 2 mo ?Went to [facility]--didn't get Depakote--became belligerent, he had been admitted several x at [facility].   ?~ 4 months ago from [facility] and went to [place] psych home--he went to [facility] and was there 24 hours and became belligerent ? ?Assessment and Plan: ?Aspiration into airway, initial encounter ?Patient presented secondary to agitation and aggression. While in the ED, he suffered an aspiration event and was admitted. ?No evidence of pneumonia clinically or on imaging. ?Completed Unasyn 2/2 history of dysphagia.  ?Speech therapy recommends dysphagia 3 diet thin liquid, assist with feeding. ? ?Lethargy ?related to Cogentin. No focal findings. ?Patient with a listed history of epilepsy- EEG obtained without active evidence of seizure activity. Ammonia normal. ?Cogentin discontinued. Lethargy resolved 2/17. ? ?History of CVA (cerebrovascular accident) ?Not on antiplatelet therapy. Patient possibly with resultant dysphagia. Noted. ? ?Dementia with behavioral disturbance Complicated by underlying diagnosis of Wernicke's Korsakoff disease. ?Patient recently required hospitalization secondary to behavioral disorder. ?was discharged on Depakote 500 mg TID, Zyprexa 5 mg BID, Zyprexa 2.5 mg BID PRN agitation and Cogentin 0.5 mg BID.  ?Patient presented with  agitation and aggression-requiring restrained due to agitation. ?Has been out of the restraints since 2/22. ?Currently plan is to transition to locked memory care unit. ?Psychiatry was consulted, recommendations: ?Zyprexa 7.5 mg twice daily,  ?Depakote 500 mg a.m. p.m. and 750 mg at night. ?Cogentin discontinued.  Lithium discontinued. ? ?Have re: consulted psych to see patient for escalating behaviours ? ?Wernicke's disease ?Patient with recent prolonged hospitalization. Secondary to prolonged alcohol use. ?-Continue MVI, thiamine ? ?Drug-induced parkinsonism (HCC) ?Noted. Stable ?Cut back carbidopa-levodopa to 1/2 tab 25-100 mg tid ? ?Hypothyroidism ?-Continue Synthroid 75 mcg daily ? ?Essential hypertension ?Blood pressure now stable. ?Continue Norvasc. ? ?Subjective:  ? ?Coherent but trembly ?Re-directable ?No sitter follows commands ?Difficult to communicat e verbally with him but he understand swell ? ?Physical Exam: ?Vitals:  ? 07/06/21 0500 07/06/21 1956 07/07/21 0730 07/07/21 1343  ?BP: (!) 106/52 (!) 145/73 (!) 162/88 127/74  ?Pulse: (!) 57 73 70 79  ?Resp: 16 18 18 17   ?Temp: 98.1 ?F (36.7 ?C) 97.7 ?F (36.5 ?C) 98.3 ?F (36.8 ?C) (!) 97.3 ?F (36.3 ?C)  ?TempSrc: Oral Oral Oral Oral  ?SpO2: 97% 100% 98% 95%  ?Weight:      ?Height:      ? ?Awake coherent  ?Mumbling ?Cta b no rales no rhonchi ?S1 S2 no m/r/g ?Abd sfot nt nd no rebound ?Some tremor ? ?Data Reviewed: ? I have ordered test including CBC and BMP   ? 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 ?Disposition: ?Status is: Inpatient ?Remains inpatient appropriate because: Unsafe discharge.  Requires one-to-one sitter.  Need memory care unit ? ?Author: ?Rhetta Mura, MD ?07/07/2021 2:28 PM ? ?For on call review [email or web address] ?

## 2021-07-07 NOTE — TOC Progression Note (Signed)
Transition of Care (TOC) - Progression Note  ? ? ?Patient Details  ?Name: Trevor Cook ?MRN: [id number] ?Date of Birth: 1946-01-14 ? ?Transition of Care (TOC) CM/SW Contact  ?Darleene Cleaver, LCSW ?Phone Number: ?07/07/2021, 5:44 PM ? ?Clinical Narrative:    ? ?CSW spoke to patient's son to discuss status of discharge.  CSW informed him that [facility] Psychiatrist wants to speak to the psychiatrist at the hospital to discuss patient's psych medications.  CSW informed patient's son, that psych was not able to get in touch with [facility]  Per patient's son, he is hopeful that [place] can accept patient back.  He is also interested in having patient go to a geropsychiatry facility to get patient's medications corrected.  CSW informed patient's son, that the hospital can try to make a referral again to geropsychiatry to see if any will take patient voluntarily.  Patient's son said that he is willing to drive patient to [facility] geropsychiatry's ED department to see if he can get him placed there.  CSW informed him they would have to have a bed available and he would have to meet criteria for placement.  Patient's son said he has also been working with [facility] at [facility] to make sure things are in place for patient to go to memory care.  CSW to continue to follow patient's progress throughout discharge planning. ? ? ?Expected Discharge Plan:  (TBD) ?Barriers to Discharge: Continued Medical Work up ? ?Expected Discharge Plan and Services ?Expected Discharge Plan:  (TBD) ?  ?  ?  ?Living arrangements for the past 2 months:  ([facility]) ?Expected Discharge Date: 07/04/21               ?  ?  ?  ?  ?  ?  ?  ?  ?  ?  ? ? ?Social Determinants of Health (SDOH) Interventions ?  ? ?Readmission Risk Interventions ?No flowsheet data found. ? ?

## 2021-07-08 DIAGNOSIS — T17908A Unspecified foreign body in respiratory tract, part unspecified causing other injury, initial encounter: Secondary | ICD-10-CM | POA: Diagnosis not present

## 2021-07-08 MED ORDER — OLANZAPINE 5 MG PO TABS
5.0000 mg | ORAL_TABLET | Freq: Three times a day (TID) | ORAL | Status: DC
  Administered 2021-07-08 – 2021-07-11 (×8): 5 mg via ORAL
  Filled 2021-07-08 (×9): qty 1

## 2021-07-08 MED ORDER — PROPRANOLOL HCL 10 MG PO TABS
10.0000 mg | ORAL_TABLET | Freq: Two times a day (BID) | ORAL | Status: DC
Start: 2021-07-08 — End: 2021-07-11
  Administered 2021-07-08 – 2021-07-11 (×7): 10 mg via ORAL
  Filled 2021-07-08 (×7): qty 1

## 2021-07-08 MED ORDER — OLANZAPINE 5 MG PO TABS: 5.0000 mg | ORAL_TABLET | Freq: Two times a day (BID) | ORAL | Status: DC

## 2021-07-08 NOTE — TOC Progression Note (Signed)
Transition of Care (TOC) - Progression Note  ? ? ?Patient Details  ?Name: Trevor Cook ?MRN: [id number] ?Date of Birth: 08-16-1945 ? ?Transition of Care (TOC) CM/SW Contact  ?Darleene Cleaver, LCSW ?Phone Number: ?07/08/2021, 3:38 PM ? ?Clinical Narrative:    ? ?Patient's son is interested in inpatient psyh under voluntary admission, preferably [place].  CSW faxed updated clinicals to the following Geripsych facilities: ? ?[facility]       [phone] ?[facility]         [phone] ?[facility]         [phone] or [phone] ?[facility]            [phone] ?[facility]          [phone] ?[facility]                      [phone] ?[facility]     [phone] ?[place]             [phone] ?[facility]         [phone] ?[place]       [phone] ?[facility]                 [phone] or [phone] ?[place]               [phone] ?[facility]           [phone] ?[facility]        [phone] ?Center ?[place]                  [phone]. [phone] ?[facility]       [phone] ?[place]               [phone] ?  ? ?Expected Discharge Plan:  (TBD) ?Barriers to Discharge: Continued Medical Work up ? ?Expected Discharge Plan and Services ?Expected Discharge Plan:  (TBD) ?  ?  ?  ?Living arrangements for the past 2 months:  ([facility]) ?Expected Discharge Date: 07/04/21               ?  ?  ?  ?  ?  ?  ?  ?  ?  ?  ? ? ?Social Determinants of Health (SDOH) Interventions ?  ? ?Readmission Risk Interventions ?No flowsheet data found. ? ?

## 2021-07-08 NOTE — Progress Notes (Signed)
?  Progress Note ? ? ?Patient: Trevor Cook OEV:[id number] DOB: [birth year] DOA: 06/15/2021     Hospitalization day: 18 ?DOS: the patient was seen and examined on 07/08/2021 ?  ?Brief hospital course: ? ?76 y.o. male with a history of dementia, bipolar 1 disorder, epilepsy, CVA, hypothyroidism, Wernicke-Korsakoff syndrome.  ?Patient presented secondary to agitation and aggression. While in the ED, he suffered an aspiration event and was admitted. Psychiatry consulted for discharge recommendations. ?Medically stable as of 2/28 to be transferred to memory care unit at ALF. ? ?Psych H/o ?Jan 2021 was less responsive--went to rehab for 2 mo ?Went to [facility]--didn't get Depakote--became belligerent, he had been admitted several x at [facility].   ?~ 4 months ago from [facility] and went to [place] psych home--he went to [facility] and was there 24 hours and became belligerent ? ?Assessment and Plan: ?Aspiration into airway, initial encounter ?Patient presented secondary to agitation and aggression. While in the ED, he suffered an aspiration event and was admitted. ?No evidence of pneumonia clinically or on imaging. ?Completed Unasyn 2/2 history of dysphagia.  ?Speech therapy recommends dysphagia 3 diet thin liquid, assist with feeding. ? ?Lethargy ?related to Cogentin. No focal findings. ?Patient with a listed history of epilepsy- EEG obtained without active evidence of seizure activity. Ammonia normal. ?Cogentin discontinued. Lethargy resolved 2/17. ? ?History of CVA (cerebrovascular accident) ?Not on antiplatelet therapy. Patient possibly with resultant dysphagia. Noted. ? ?Dementia with behavioral disturbance Complicated by underlying diagnosis of Wernicke's Korsakoff disease. ?Patient recently required hospitalization secondary to behavioral disorder. ?was discharged on Depakote 500 mg TID, Zyprexa 5 mg BID, Zyprexa 2.5 mg BID PRN agitation and Cogentin 0.5 mg BID.  ?Patient presented with  agitation and aggression-requiring restrained due to agitation. ?Has been out of the restraints since 2/22. ?Currently plan is to transition to locked memory care unit. ?Psychiatry was consulted, recommendations: ?Zyprexa 7.5 mg twice daily,  ?Depakote 500 mg a.m. p.m. and 750 mg at night. ?Cogentin discontinued.  Lithium discontinued. ? ?Have re: consulted psych to see patient for escalating behaviours ? ?Wernicke's disease ?Patient with recent prolonged hospitalization. Secondary to prolonged alcohol use. ?-Continue MVI, thiamine ?Drug-induced parkinsonism (HCC) ?Noted. Stable ?Cut back carbidopa-levodopa to 1/2 tab 25-100 mg tid ?Starting propanolol 10 bid for tremor--consider amantadine  ? ?Hypothyroidism ?-Continue Synthroid 75 mcg daily ? ?Essential hypertension ?Blood pressure now stable. ?Stop norvasc ? ?Subjective:  ? ?Coherent but trembly ?Otherwsie re-directable ?Awaiting facility ?Long conversation + neuro--appreciated ? ?Physical Exam: ?Vitals:  ? 07/07/21 0730 07/07/21 1343 07/07/21 2056 07/08/21 1353  ?BP: (!) 162/88 127/74 124/70 (!) 127/59  ?Pulse: 70 79 66 72  ?Resp: 18 17 18 17   ?Temp: 98.3 ?F (36.8 ?C) (!) 97.3 ?F (36.3 ?C) 97.7 ?F (36.5 ?C) (!) 97.5 ?F (36.4 ?C)  ?TempSrc: Oral Oral Oral Oral  ?SpO2: 98% 95% 98% 96%  ?Weight:      ?Height:      ? ?Awake coherent  ?Mumbling ?Cta b no rales no rhonchi ? ?Data Reviewed: ? I have ordered test including CBC and BMP   ? ?Family Communication: Long discussion on the phone with patient's son Chris-on 3/4 ? ?Disposition: ?Status is: Inpatient ?Remains inpatient appropriate because: Unsafe discharge.  Requires one-to-one sitter.  Need memory care unit ? ?Author: ?5/4, MD ?07/08/2021 4:51 PM ? ?For on call review [email or web address] ?

## 2021-07-08 NOTE — Progress Notes (Signed)
?   07/08/21 1356  ?Mobility  ?Activity Refused mobility  ? ?Pt stated he did not want to walk right now. Kept asking for his shoes, stating he left them "in the keys". Notified RN. ? ?Timoteo Expose. ?Mobility Specialist ?Acute Rehab Services ?Office: [phone] ? ?

## 2021-07-08 NOTE — Plan of Care (Signed)
?  Problem: Health Behavior/Discharge Planning: ?Goal: Ability to manage health-related needs will improve ?Outcome: Progressing ?  ?Problem: Clinical Measurements: ?Goal: Ability to maintain clinical measurements within normal limits will improve ?Outcome: Progressing ?Goal: Will remain free from infection ?Outcome: Progressing ?Goal: Diagnostic test results will improve ?Outcome: Progressing ?Goal: Respiratory complications will improve ?Outcome: Progressing ?Goal: Cardiovascular complication will be avoided ?Outcome: Progressing ?  ?Problem: Nutrition: ?Goal: Adequate nutrition will be maintained ?Outcome: Progressing ?  ?Problem: Coping: ?Goal: Level of anxiety will decrease ?Outcome: Progressing ?  ?Problem: Elimination: ?Goal: Will not experience complications related to bowel motility ?Outcome: Progressing ?Goal: Will not experience complications related to urinary retention ?Outcome: Progressing ?  ?Problem: Safety: ?Goal: Ability to remain free from injury will improve ?Outcome: Progressing ?  ?Problem: Skin Integrity: ?Goal: Risk for impaired skin integrity will decrease ?Outcome: Progressing ?  ?Problem: Education: ?Goal: Knowledge of General Education information will improve ?Description: Including pain rating scale, medication(s)/side effects and non-pharmacologic comfort measures ?Outcome: Progressing ?  ?Problem: Health Behavior/Discharge Planning: ?Goal: Ability to manage health-related needs will improve ?Outcome: Progressing ?  ?Problem: Activity: ?Goal: Risk for activity intolerance will decrease ?Outcome: Progressing ?  ?Problem: Nutrition: ?Goal: Adequate nutrition will be maintained ?Outcome: Progressing ?  ?Problem: Pain Managment: ?Goal: General experience of comfort will improve ?Outcome: Progressing ?  ?Problem: Education: ?Goal: Ability to demonstrate appropriate child care will improve ?Outcome: Not Applicable ?Goal: Ability to verbalize an understanding of newborn treatment and  procedures will improve ?Outcome: Not Applicable ?Goal: Ability to demonstrate an understanding of appropriate nutrition and feeding will improve ?Outcome: Not Applicable ?Goal: Individualized Educational Video(s) ?Outcome: Not Applicable ?  ?Problem: Skin Integrity: ?Goal: Risk for impaired skin integrity will decrease ?Outcome: Not Applicable ?Goal: Demonstrates signs of wound healing without infection ?Outcome: Not Applicable ?  ?

## 2021-07-08 NOTE — Consult Note (Signed)
NEUROLOGY CONSULTATION NOTE   Date of service: July 08, 2021 Patient Name: Trevor Cook MRN:  [id number] DOB:  12/03/1945 Reason for consult: "Parkinson" Requesting Provider: Rhetta Mura, MD _ _ _   _ __   _ __ _ _  __ __   _ __   __ _  History of Present Illness   Trevor Cook is a 76 y.o. male  with history of dementia, bipolar 1 disorder, epilepsy, CVA, hypothyroidism, Wernicke-Korsakoff syndrome. Patient presented secondary to agitation and aggression. Has been dismissed from 7 different long term facilities. Per primary progress notes:  Jan 2021 was less responsive--went to rehab for 2 mo. Went to [facility]--didn't get Depakote--became belligerent, he had been admitted several x at [facility]. ~ 4 months ago from [facility] and went to [facility]--he went to [facility] and was there 24 hours and became belligerent and was released.   Seen today resting in the bed, Mr. Trevor Cook awakened to my voice and said good morning. However, when I began asking him why he was admitted he became agitated and began shaking. He began mumbling and started having tremors.    Charge nurse says Mr. Jelinski becomes agitated quickly and does not like to be confined. The staff have closed the double doors and allow him to wander the halls. He has signage on his room door so that he can find it. She reports that patient has been intermittently agitated and it is situational and heightens quickly. For example Mr Mccamish could not open his ice cream and threw it across the room. Staff now help him feed himself due to the tremors and agitation with food containers. He is aggressive with staff only if they try to confine him per staff.   ROS   Unable to obtain detailed ROS 2/2 underlying psychosis and confusion.  Past History   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HCC)    Bipolar 1 disorder (HCC)    Cerebellar disorder     Epilepsy (HCC)    Hypothyroid    Insomnia    Other postablative hypothyroidism    Stutter    Tremor    Unspecified condition of brain    Wernicke's encephalopathy    Past Surgical History:  Procedure Laterality Date   NO PAST SURGERIES     Family History  Problem Relation Age of Onset   Congestive Heart Failure Mother    Cancer Father    Social History   Socioeconomic History   Marital status: Single    Spouse name: Not on file   Number of children: 1   Years of education: n/a   Highest education level: Not on file  Occupational History   Occupation: Disabled  Tobacco Use   Smoking status: Former   Smokeless tobacco: Never  Substance and Sexual Activity   Alcohol use: Not Currently    Comment: quit 1999   Drug use: No   Sexual activity: Not on file  Other Topics Concern   Not on file  Social History Narrative   Lives with son   Right handed   Caffeine use: daily use   Social Determinants of Health   Financial Resource Strain: Not on file  Food Insecurity: Not on file  Transportation Needs: Not on file  Physical Activity: Not on file  Stress: Not on file  Social Connections: Not on file   No Known Allergies  Medications   Medications Prior to Admission  Medication Sig Dispense Refill Last Dose   carbidopa-levodopa (SINEMET IR) 25-100 MG tablet Take 1 tablet by mouth 3 (three) times daily.      diclofenac Sodium (VOLTAREN) 1 % GEL Apply 4 g topically daily. Apply to right knee and affected joints      divalproex (DEPAKOTE) 500 MG DR tablet Take 500 mg by mouth 3 (three) times daily.      folic acid (FOLVITE) 1 MG tablet Take 1 mg by mouth daily.      lactobacillus acidophilus (BACID) TABS tablet Take 2 tablets by mouth 3 (three) times daily.      levothyroxine (SYNTHROID) 75 MCG tablet Take 75 mcg by mouth daily before breakfast.      lithium carbonate 300 MG capsule Take 300 mg by mouth 2 (two) times daily with a meal.      melatonin 3 MG TABS  tablet Take 3 mg by mouth at bedtime.      Multiple Vitamin (MULTIVITAMIN WITH MINERALS) TABS tablet Take 1 tablet by mouth daily.      OLANZapine (ZYPREXA) 5 MG tablet Take 5 mg by mouth 2 (two) times daily.      OLANZapine zydis (ZYPREXA) 5 MG disintegrating tablet Take 2.5 mg by mouth 2 (two) times daily as needed for psychosis or agitation.      polyethylene glycol (MIRALAX / GLYCOLAX) 17 g packet Take 17 g by mouth daily.      thiamine 100 MG tablet Take 1 tablet (100 mg total) by mouth daily.      [DISCONTINUED] benztropine (COGENTIN) 0.5 MG tablet Take 1 tablet (0.5 mg total) by mouth 2 (two) times daily.      levothyroxine (SYNTHROID, LEVOTHROID) 50 MCG tablet Take 50 mcg by mouth daily after supper. (Patient not taking: Reported on 06/21/2021)   Not Taking   risperiDONE (RISPERDAL) 0.5 MG tablet Take 1 tablet (0.5 mg total) by mouth 2 (two) times daily. (Patient not taking: Reported on 06/21/2021)   Not Taking     Vitals   Vitals:   07/07/21 0730 07/07/21 1343 07/07/21 2056 07/08/21 1353  BP: (!) 162/88 127/74 124/70 (!) 127/59  Pulse: 70 79 66 72  Resp: 18 17 18 17   Temp: 98.3 F (36.8 C) (!) 97.3 F (36.3 C) 97.7 F (36.5 C) (!) 97.5 F (36.4 C)  TempSrc: Oral Oral Oral Oral  SpO2: 98% 95% 98% 96%  Weight:      Height:         Body mass index is 33.8 kg/m.  Physical Exam   General: sitting in the sofa next to bed; in no acute distress. HENT: Normal oropharynx and mucosa. Normal external appearance of ears and nose. Neck: Supple, no pain or tenderness CV: No JVD. No peripheral edema. Pulmonary: Symmetric Chest rise. Normal respiratory effort. Abdomen: Soft to touch, non-tender. Ext: No cyanosis, edema, or deformity Skin: No rash. Normal palpation of skin.  Musculoskeletal: Normal digits and nails by inspection. No clubbing.  Neurologic Examination  Mental status/Cognition: Alert, oriented to self. Wants to eat his lunch. Bradyphrenic. Speech/language: Non fluent,  comprehension intact to simple commands. Cranial nerves:   CN II Pupils equal and reactive to light, no VF deficits   CN III,IV,VI EOM intact, no gaze preference or deviation, no nystagmus   CN V normal sensation in V1, V2, and V3 segments bilaterally   CN VII no asymmetry, no nasolabial fold flattening. Has mask life faces.   CN VIII normal hearing to speech  CN IX & X normal palatal elevation, no uvular deviation   CN XI 5/5 head turn and 5/5 shoulder shrug bilaterally   CN XII midline tongue protrusion   Motor:  Muscle bulk: poor, tone increased. Tremors: noted to have BL parkinsonian tremors. Declined to participate with full strength testing but is walking around the room and towards the end of the exam, goes to the bathroom and washing dishes.  Reflexes: Politely declined to let me assess his reflexes.  Sensation:  Light touch Intact throughout   Pin prick    Temperature    Vibration   Proprioception    Coordination/Complex Motor:  - Finger to Nose with ataxia. - Rapid alternating movement are slowed. - Gait: shuffles somewhat. Stride length short. Arm swing poor. Base width narrow.  Labs   CBC:  Recent Labs  Lab 07/04/21 0503 07/07/21 0511  WBC 6.7 6.8  NEUTROABS  --  2.3  HGB 12.4* 11.6*  HCT 39.1 36.5*  MCV 101.3* 100.8*  PLT 246 205    Basic Metabolic Panel:  Lab Results  Component Value Date   NA 136 07/07/2021   K 4.0 07/07/2021   CO2 30 07/07/2021   GLUCOSE 86 07/07/2021   BUN 17 07/07/2021   CREATININE 0.74 07/07/2021   CALCIUM 9.0 07/07/2021   GFRNONAA >60 07/07/2021   GFRAA 100 04/07/2020   Lipid Panel: No results found for: LDLCALC HgbA1c:  Lab Results  Component Value Date   HGBA1C 4.4 (L) 06/18/2021   Urine Drug Screen:     Component Value Date/Time   LABOPIA NONE DETECTED 05/13/2020 0326   COCAINSCRNUR NONE DETECTED 05/13/2020 0326   LABBENZ POSITIVE (A) 05/13/2020 0326   AMPHETMU NONE DETECTED 05/13/2020 0326   THCU NONE  DETECTED 05/13/2020 0326   LABBARB NONE DETECTED 05/13/2020 0326    Alcohol Level     Component Value Date/Time   ETH <10 06/15/2021 1412    CT Head without contrast(Personally reviewed): 06/17/21. No acute intracranial abnormality.  Impression   Carolin GuernseyJohn Goswami is a 76 y.o. male with PMH significant for dementia with behavioral disturbance, bipolar 1 disorder, epilepsy, CVA, hypothyroidism, Wernicke-Korsakoff syndrome. Patient presented secondary to agitation and aggression. Has been dismissed from 7 different long term facilities. We were asked to evaluate him for assistance with management of tremors and parkinsonism which is suspect is partially due to depakote. Attempts have been made in the past to wean him off Depakote, but have been unsuccessful due to agitaiton. Depakote seems to be very effective at controlling his agitation but is also contributing to his tremors and parkinsonism. I think we can try to treat his tremors with some propranolol. Another option is amantadine which would help with tremor and parkinsonism. [facility]  Recommendations  - Start propranolol 10mg  BID. Can increase by 10mg  every week to 10mg  QID if he can tolerate it without significant bradycardia or orthostasis. - Would do Amantadine next if his tremors an parkinsonism still continue to be a problem despite Propranolol. Would recommend starting out at 100mg  BID and then after a week, increase to 100mg  TID. - He would benefit from being on Nuplazid outpatient which would help with his parkinsonian symptoms without worsening his psychosis. - follow up with outpatient neurologist or with a movement disorder specialist. - We will be available on an as needed basis. _____________________________________________________________________  Plan discussed with Dr. Mahala MenghiniSamtani.   Thank you for the opportunity to take part in the care of this  patient. If you have any further questions,  please contact the neurology consultation attending.  Signed,  Erick Blinks [facility] Pager Number [phone] _ _ _   _ __   _ __ _ _  __ __   _ __   __ _

## 2021-07-08 NOTE — Consult Note (Signed)
[facility] Face-to-Face Psychiatry Consult   Reason for Consult:  night time agitation Referring Physician:  Rhetta Mura, MD  Patient Identification: Trevor Cook MRN:  [id number] Principal Diagnosis: Aspiration into airway, initial encounter Diagnosis:  Principal Problem:   Aspiration into airway, initial encounter Active Problems:   Essential hypertension   Hypothyroidism   Korsakoff syndrome (HCC)   History of alcohol abuse   Drug-induced parkinsonism (HCC)   Macrocytic anemia   Hyperglycemia   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HCC)   Wernicke's disease   Dementia with behavioral disturbance   History of CVA (cerebrovascular accident)   Lethargy   Total Time Spent in Direct Patient Care:  I personally spent 35 minutes on the unit in direct patient care. The direct patient care time included face-to-face time with the patient, reviewing the patient's chart, communicating with other professionals, and coordinating care. Greater than 50% of this time was spent in counseling or coordinating care with the patient regarding goals of hospitalization, psycho-education, and discharge planning needs.   HPI:  Renier Chaddock is a 76 y.o. male patient admitted with a history of dementia, bipolar 1 disorder, epilepsy, CVA, hypothyroidism, Wernicke-Korsakoff syndrome.  Patient presented secondary to agitation and aggression. While in the ED, he suffered an aspiration event and was admitted. Psychiatry consulted for discharge recommendations. Medically stable as of 2/28 to be transferred to memory care unit at ALF.Marland Kitchen  At time of assessment, patient is resting quietly.  He attempts to sit up and engage in conversation, however speech is garbled.  Nursing is at bedside and states that patient has had "a good weekend."  She states that last weekend he was aggressive, but has been calmer this weekend.  He has increased his p.o. intake of both food and water, and is able to express his needs.   They have allowed him to "wander in the hallway", and as long as door to the elevators as Close, he paces up and down his hall.  He has signs on his door to help him recognize his room.  He has been noted to enter other patient's rooms, but will notice someone in the bed and acknowledge that it is not his room.  He has been redirectable and last agitated.  Nursing notes that after 4 PM he does tend to be a little bit more frustrated in particular to his tremor and being unable to open things.  Nursing staff notes that he is awake in the morning early and will take 1 nap during the day.  This does not seem to affect his sleep overnight.  Past Psychiatric History: Bipolar 1 disorder, Warneke Korsakoff syndrome, dementia  Risk to Self: Yes, when agitated Risk to Others: Yes, when agitated   Past Medical History:  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HCC)    Bipolar 1 disorder (HCC)    Cerebellar disorder    Epilepsy (HCC)    Hypothyroid    Insomnia    Other postablative hypothyroidism    Stutter    Tremor    Unspecified condition of brain    Wernicke's encephalopathy     Past Surgical History:  Procedure Laterality Date   NO PAST SURGERIES     Family History:  Family History  Problem Relation Age of Onset   Congestive Heart Failure Mother    Cancer Father    Family Psychiatric  History: None Social History:  Social History   Substance and Sexual Activity  Alcohol Use Not Currently  Comment: quit 1999     Social History   Substance and Sexual Activity  Drug Use No    Social History   Socioeconomic History   Marital status: Single    Spouse name: Not on file   Number of children: 1   Years of education: n/a   Highest education level: Not on file  Occupational History   Occupation: Disabled  Tobacco Use   Smoking status: Former   Smokeless tobacco: Never  Substance and Sexual Activity   Alcohol use: Not Currently     Comment: quit 1999   Drug use: No   Sexual activity: Not on file  Other Topics Concern   Not on file  Social History Narrative   Lives with son   Right handed   Caffeine use: daily use   Social Determinants of Health   Financial Resource Strain: Not on file  Food Insecurity: Not on file  Transportation Needs: Not on file  Physical Activity: Not on file  Stress: Not on file  Social Connections: Not on file   Additional Social History:    Allergies:  No Known Allergies  Labs:  Results for orders placed or performed during the hospital encounter of 06/15/21 (from the past 48 hour(s))  Creatinine, serum     Status: None   Collection Time: 07/06/21  9:26 PM  Result Value Ref Range   Creatinine, Ser 0.81 0.61 - 1.24 mg/dL   GFR, Estimated >68 >34 mL/min    Comment: (NOTE) Calculated using the CKD-EPI Creatinine Equation (2021) Performed at [facility], [address], [place], [place] [address]   Comprehensive metabolic panel     Status: Abnormal   Collection Time: 07/07/21  5:11 AM  Result Value Ref Range   Sodium 136 135 - 145 mmol/L   Potassium 4.0 3.5 - 5.1 mmol/L   Chloride 101 98 - 111 mmol/L   CO2 30 22 - 32 mmol/L   Glucose, Bld 86 70 - 99 mg/dL    Comment: Glucose reference range applies only to samples taken after fasting for at least 8 hours.   BUN 17 8 - 23 mg/dL   Creatinine, Ser 2.97 0.61 - 1.24 mg/dL   Calcium 9.0 8.9 - 98.9 mg/dL   Total Protein 6.0 (L) 6.5 - 8.1 g/dL   Albumin 3.4 (L) 3.5 - 5.0 g/dL   AST 21 15 - 41 U/L   ALT 13 0 - 44 U/L   Alkaline Phosphatase 45 38 - 126 U/L   Total Bilirubin 0.3 0.3 - 1.2 mg/dL   GFR, Estimated >21 >19 mL/min    Comment: (NOTE) Calculated using the CKD-EPI Creatinine Equation (2021)    Anion gap 5 5 - 15    Comment: Performed at [facility], [address], [place], [place] [address]  CBC with Differential/Platelet     Status: Abnormal   Collection Time: 07/07/21  5:11  AM  Result Value Ref Range   WBC 6.8 4.0 - 10.5 K/uL   RBC 3.62 (L) 4.22 - 5.81 MIL/uL   Hemoglobin 11.6 (L) 13.0 - 17.0 g/dL   HCT 81.4 (L) 48.1 - 85.6 %   MCV 100.8 (H) 80.0 - 100.0 fL   MCH 32.0 26.0 - 34.0 pg   MCHC 31.8 30.0 - 36.0 g/dL   RDW 31.4 97.0 - 26.3 %   Platelets 205 150 - 400 K/uL   nRBC 0.0 0.0 - 0.2 %   Neutrophils Relative % 34 %  Neutro Abs 2.3 1.7 - 7.7 K/uL   Lymphocytes Relative 53 %   Lymphs Abs 3.6 0.7 - 4.0 K/uL   Monocytes Relative 11 %   Monocytes Absolute 0.8 0.1 - 1.0 K/uL   Eosinophils Relative 1 %   Eosinophils Absolute 0.1 0.0 - 0.5 K/uL   Basophils Relative 1 %   Basophils Absolute 0.1 0.0 - 0.1 K/uL   Immature Granulocytes 0 %   Abs Immature Granulocytes 0.02 0.00 - 0.07 K/uL    Comment: Performed at [facility], [address] [place] [address] [place], [place] [address]    Current Facility-Administered Medications  Medication Dose Route Frequency Provider Last Rate Last Admin   acetaminophen (TYLENOL) tablet 650 mg  650 mg Oral Q6H PRN Bobette Mo, MD   650 mg at 07/08/21 1622   Or   acetaminophen (TYLENOL) suppository 650 mg  650 mg Rectal Q6H PRN Bobette Mo, MD       bisacodyl (DULCOLAX) suppository 10 mg  10 mg Rectal Daily PRN Rolly Salter, MD       carbidopa-levodopa (SINEMET IR) 25-100 MG per tablet immediate release 0.5 tablet  0.5 tablet Oral TID Rhetta Mura, MD   0.5 tablet at 07/08/21 1621   divalproex (DEPAKOTE) DR tablet 500 mg  500 mg Oral BID Maryagnes Amos, FNP   500 mg at 07/08/21 1521   And   divalproex (DEPAKOTE) DR tablet 750 mg  750 mg Oral QHS Maryagnes Amos, FNP   750 mg at 07/07/21 2206   enoxaparin (LOVENOX) injection 40 mg  40 mg Subcutaneous Q24H Rhetta Mura, MD   40 mg at 07/07/21 2207   influenza vaccine adjuvanted (FLUAD) injection 0.5 mL  0.5 mL Intramuscular Tomorrow-1000 Narda Bonds, MD       levothyroxine (SYNTHROID) tablet 75 mcg  75 mcg Oral  Q0600 Narda Bonds, MD   75 mcg at 07/08/21 6045   MEDLINE mouth rinse  15 mL Mouth Rinse BID Narda Bonds, MD   15 mL at 07/08/21 1100   memantine (NAMENDA) tablet 5 mg  5 mg Oral QHS Bobette Mo, MD   5 mg at 07/07/21 2207   multivitamin with minerals tablet 1 tablet  1 tablet Oral Daily Bobette Mo, MD   1 tablet at 07/08/21 4098   naphazoline-glycerin (CLEAR EYES REDNESS) ophth solution 1-2 drop  1-2 drop Both Eyes QID PRN Jimmye Norman, NP       OLANZapine (ZYPREXA) tablet 5 mg  5 mg Oral TID Mariel Craft, MD       ondansetron [facility]) tablet 4 mg  4 mg Oral Q6H PRN Bobette Mo, MD       Or   ondansetron [facility]) injection 4 mg  4 mg Intravenous Q6H PRN Bobette Mo, MD       pneumococcal 23 valent vaccine (PNEUMOVAX-23) injection 0.5 mL  0.5 mL Intramuscular Tomorrow-1000 Narda Bonds, MD       propranolol (INDERAL) tablet 10 mg  10 mg Oral BID Erick Blinks, MD   10 mg at 07/08/21 1521   saccharomyces boulardii (FLORASTOR) capsule 250 mg  250 mg Oral BID Rolly Salter, MD   250 mg at 07/08/21 1100   thiamine tablet 100 mg  100 mg Oral Daily Bobette Mo, MD   100 mg at 07/08/21 1191    Musculoskeletal: Strength & Muscle Tone:  Not assessed Gait & Station:  In bed on  time of assessment Patient leans: N/A            Psychiatric Specialty Exam:  Presentation  General Appearance: Casual Eye Contact:Minimal Speech:Garbled (sleepy) Speech Volume:Normal Handedness:No data recorded  Mood and Affect  Mood:Euthymic Affect:Congruent  Thought Process  Thought Processes:-- (unable to assess) Descriptions of Associations:-- (unable to assess)  Orientation:-- (unable to assess)  Thought Content:-- (unable to assess)  History of Schizophrenia/Schizoaffective disorder:No data recorded Duration of Psychotic Symptoms:No data recorded Hallucinations:Hallucinations: -- (not responding to internal  stimuli)  Ideas of Reference:None  Suicidal Thoughts:Suicidal Thoughts: No  Homicidal Thoughts:Homicidal Thoughts: No   Sensorium  Memory:-- (unable to assess) Judgment:No data recorded Insight:-- (unable to assess)  Executive Functions  Concentration:Poor Attention Span:Poor Recall:Poor Fund of Knowledge:-- (unable to assess) Language:Fair  Psychomotor Activity  Psychomotor Activity:Psychomotor Activity: Normal  Assets  Assets:Social Support  Sleep  Sleep:Sleep: Good  Physical Exam: Physical Exam Vitals and nursing note reviewed.  Cardiovascular:     Rate and Rhythm: Normal rate.  Pulmonary:     Effort: Pulmonary effort is normal. No respiratory distress.  Neurological:     Comments: tremor   Review of Systems  Unable to perform ROS: Other   sleepy at time of assessment Blood pressure (!) 127/59, pulse 72, temperature (!) 97.5 F (36.4 C), temperature source Oral, resp. rate 17, height 5\' 7"  (1.702 m), weight 97.9 kg, SpO2 96 %. Body mass index is 33.8 kg/m.  Treatment Plan Summary: Daily contact with patient to assess and evaluate symptoms and progress in treatment and Medication management   Changed Zyprexa to 5 mg TID to target times of patient's greatest agitation:  10 AM, 4 PM and 10 PM. Continue other medications at current doses to evenly manage dopamine depletion with dopamine replacement.  Appreciate social work assistance in identifying inpatient geriatric psychiatry facility to further manage patient's medications.  Disposition: Recommend psychiatric Inpatient admission when medically cleared.  Mariel CraftSHEILA M Schwanda Zima, MD 07/08/2021 6:38 PM

## 2021-07-09 DIAGNOSIS — T17908A Unspecified foreign body in respiratory tract, part unspecified causing other injury, initial encounter: Secondary | ICD-10-CM | POA: Diagnosis not present

## 2021-07-09 NOTE — TOC Progression Note (Signed)
Transition of Care (TOC) - Progression Note  ? ? ?Patient Details  ?Name: Trevor Cook ?MRN: [id number] ?Date of Birth: 11-26-1945 ? ?Transition of Care (TOC) CM/SW Contact  ?Ida Rogue, LCSW ?Phone Number: ?07/09/2021, 10:58 AM ? ?Clinical Narrative:   Spoke with [name] at [facility] who is now saying that she plans to come deliver an immediate d/c notice to patient as she will not receive him back.  She continues to cite the fact that he is no longer on [facility] as the reason.  I am unclear as to whether the psychiatrists ever talked to each other as she was evasive about that information.  According to her, we knew last Friday that they would not take him back despite a note by my co-worker in [facility] who spoke with son over weekend. See note dated 3/4. Spoke with son to give him this infomration.  He felt blind sided.  Stated he had already called over to [facility] this AM but was unable to speak with her.  He will try again. TOC will continue to follow during the course of hospitalization. ? ? ? ? ?Expected Discharge Plan:  (TBD) ?Barriers to Discharge: Continued Medical Work up ? ?Expected Discharge Plan and Services ?Expected Discharge Plan:  (TBD) ?  ?  ?  ?Living arrangements for the past 2 months:  ([facility]) ?Expected Discharge Date: 07/04/21               ?  ?  ?  ?  ?  ?  ?  ?  ?  ?  ? ? ?Social Determinants of Health (SDOH) Interventions ?  ? ?Readmission Risk Interventions ?No flowsheet data found. ? ?

## 2021-07-09 NOTE — Progress Notes (Signed)
?   07/09/21 1400  ?Mobility  ?Activity Ambulated independently in hallway  ?Level of Assistance Independent  ?Assistive Device None  ?Distance Ambulated (ft) 300 ft  ?Activity Response Tolerated well  ?$Mobility charge 1 Mobility  ? ?Pt agreeable to mobilize this afternoon. Ambulated about 317ft in hall with no AD, tolerated well. No complaints. Left pt in bed, call bell at side. RN/NT notified of session.  ? ?[facility] [employer]. ?Mobility Specialist ?Acute Rehab Services ?Office: [phone] ? ? ?

## 2021-07-09 NOTE — Progress Notes (Signed)
?Progress Note ? ? ?Patient: Trevor Cook OHY:[id number] DOB: 01-21-1946 DOA: 06/15/2021     Hospitalization day: 19 ?DOS: the patient was seen and examined on 07/09/2021 ?  ?Brief hospital course: ? ?76 y.o. male with a history of dementia, bipolar 1 disorder, epilepsy, CVA, hypothyroidism, Wernicke-Korsakoff syndrome.  ?Patient presented secondary to agitation and aggression. While in the ED, he suffered an aspiration event and was admitted. Psychiatry consulted for discharge recommendations. ?Medically stable as of 2/28 to be transferred to memory care unit at ALF. ? ?Psych H/o ?Jan 2021 was less responsive--went to rehab for 2 mo ?Went to [facility]--didn't get Depakote--became belligerent, he had been admitted several x at [facility].   ?~ 4 months ago from [facility] and went to [facility]--he went to [facility] and was there 24 hours and apparently became belligerent ? ?Assessment and Plan: ?Aspiration into airway, initial encounter ?Patient presented secondary to agitation and aggression. While in the ED, he suffered an aspiration event and was admitted. ?No pneumonia clinically or on imaging. ?Completed Unasyn 2/2 history of dysphagia.  ?Speech therapy recommends dysphagia 3 diet thin liquid, assist with feeding. ? ?Lethargy ?related to Cogentin. No focal findings. ?Patient with a listed history of epilepsy- EEG obtained without active evidence of seizure activity. Ammonia normal. ?Cogentin discontinued. Lethargy resolved 2/17. ? ?History of CVA (cerebrovascular accident) ?Not on antiplatelet therapy. Patient possibly with resultant dysphagia. Noted. ? ?Dementia with behavioral disturbance Complicated by underlying diagnosis of Wernicke's Korsakoff disease. ?Patient recently required hospitalization secondary to behavioral disorder. ?was discharged on Depakote 500 mg TID, Zyprexa 5 mg BID, Zyprexa 2.5 mg BID PRN agitation and Cogentin 0.5 mg BID.  ?Patient presented with  agitation and aggression-requiring restrained due to agitation. ?Has been out of the restraints since 2/22. ?Currently plan is unclear at this time with regards to disposition-see below ?Psychiatry was re-consulted 3/7, recommendations: ?Zyprexa 5 mg 3 times daily ?Depakote 500 mg a.m. p.m. and 750 mg at night. ?Cogentin discontinued.  Lithium discontinued. ?I have requested that they follow the patient every 2 to 3 days if possible to adjust meds ? ?Wernicke's disease ?Patient with recent prolonged hospitalization. Secondary to prolonged alcohol use. ?-Continue MVI, thiamine ?Drug-induced parkinsonism (HCC) ?Noted. Stable ?Cut back carbidopa-levodopa to 1/2 tab 25-100 mg tid ?Discussed with Dr. Derry Lory who saw the patient on 3/5-we started  ?propanolol 10 bid for tremor--consider amantadine ?  Versus other Parkinson's meds Nuplazid which is an outpatient medical only ? ?Hypothyroidism ?-Continue Synthroid 75 mcg daily ? ?Essential hypertension ?Blood pressure now stable. ?Stop norvasc ? ?Subjective:  ? ?Pleasant awake alert more interactive still has tremors no distress ? ? ?Physical Exam: ?Vitals:  ? 07/08/21 1353 07/08/21 2006 07/09/21 9485 07/09/21 0929  ?BP: (!) 127/59 125/77 (!) 142/76 130/61  ?Pulse: 72 (!) 59 (!) 54 63  ?Resp: 17 20 20    ?Temp: (!) 97.5 ?F (36.4 ?C) 98.2 ?F (36.8 ?C) 98.3 ?F (36.8 ?C)   ?TempSrc: Oral Oral Oral   ?SpO2: 96% 97% 97% 97%  ?Weight:      ?Height:      ? ?EOMI NCAT no focal deficit  ?Tremor ?Slight drooling ?ROM intact ?CTA B no added sound rales rhonchi ?Abdomen soft nontender no rebound no guarding ?Neurologically intact no focal deficit to power however does have intention tremor ? ?Data Reviewed: ? I have ordered test including CBC and BMP   ? ?Family Communication: Long discussion on the phone with patient's son Chris-on 3/4 ?Left detailed  voicemail with Ms. DD of [facility] at the phone number given to me by social worker-I have explained that the patient is  hemodynamically stable cognitively as close to his normal as he can be and that he has not been cantankerous nor has he been agitated nor aggressive-await placement per social worker in terms of other places to send him--Hospital CMO has been made aware of barriers to discharge ? ?Disposition: ?Status is: Inpatient ?Remains inpatient appropriate because: Unsafe discharge.  Requires one-to-one sitter.  Need memory care unit ? ?Author: ?Rhetta Mura, MD ?07/09/2021 4:25 PM ? ?For on call review [email or web address]. ?

## 2021-07-10 DIAGNOSIS — T17908A Unspecified foreign body in respiratory tract, part unspecified causing other injury, initial encounter: Secondary | ICD-10-CM | POA: Diagnosis not present

## 2021-07-10 MED ORDER — MEMANTINE HCL 5 MG PO TABS: 5.0000 mg | ORAL_TABLET | Freq: Every day | ORAL | 0 refills | Status: DC

## 2021-07-10 MED ORDER — DIVALPROEX SODIUM 250 MG PO DR TAB: 250.0000 mg | DELAYED_RELEASE_TABLET | Freq: Every day | ORAL | 0 refills | Status: DC

## 2021-07-10 MED ORDER — CARBIDOPA-LEVODOPA 25-100 MG PO TABS: 0.5000 | ORAL_TABLET | Freq: Three times a day (TID) | ORAL | Status: AC

## 2021-07-10 MED ORDER — DIVALPROEX SODIUM 500 MG PO DR TAB: 500.0000 mg | DELAYED_RELEASE_TABLET | Freq: Two times a day (BID) | ORAL | 0 refills | Status: DC

## 2021-07-10 MED ORDER — PROPRANOLOL HCL 10 MG PO TABS
10.0000 mg | ORAL_TABLET | Freq: Two times a day (BID) | ORAL | Status: DC
Start: 2021-07-10 — End: 2022-05-07

## 2021-07-10 MED ORDER — OLANZAPINE 5 MG PO TABS: 5.0000 mg | ORAL_TABLET | Freq: Three times a day (TID) | ORAL | Status: DC

## 2021-07-10 NOTE — TOC Progression Note (Signed)
Transition of Care (TOC) - Progression Note  ? ? ?Patient Details  ?Name: Trevor Cook ?MRN: [id number] ?Date of Birth: January 31, 1946 ? ?Transition of Care (TOC) CM/SW Contact  ?Ida Rogue, LCSW ?Phone Number: ?07/10/2021, 3:32 PM ? ?Clinical Narrative:  Called Ms [place] in [place], left message.  Received call from her a little after noon stating she was here in patient's room to evaluate and she was having a hard time getting him to wake up.  She was hoping to see him ambulate.  I alerted nursing staff and asked them to assist. ?Received another call from [facility] asking for updated d/c summary and FL2 with specifics to type of facility they are-"[facility] secured unit, memory care." Sent.  Received call from [place] who pointed out inconsistencies re: meds.  Addressed and resent.  Awaiting call back. TOC will continue to follow during the course of hospitalization. ? ? ? ? ?Expected Discharge Plan:  (TBD) ?Barriers to Discharge: Continued Medical Work up ? ?Expected Discharge Plan and Services ?Expected Discharge Plan:  (TBD) ?  ?  ?  ?Living arrangements for the past 2 months:  ([facility]) ?Expected Discharge Date: 07/10/21               ?  ?  ?  ?  ?  ?  ?  ?  ?  ?  ? ? ?Social Determinants of Health (SDOH) Interventions ?  ? ?Readmission Risk Interventions ?No flowsheet data found. ? ?

## 2021-07-10 NOTE — Discharge Summary (Addendum)
. Physician Discharge Summary   Patient: Trevor Cook MRN: [id number] DOB: 04/18/1946  Admit date:     06/15/2021  Discharge date: 07/10/21  Discharge Physician: Nita Sells   PCP: Tamsen Roers, MD   Recommendations at discharge:   See [facility] for med changes Ensure that patient on aspiration precautions because of his risk for aspiration pneumonia Please have him follow-up with neurology for review of his history of CVA as well as drug-induced parkinsonism   continue Depakote He will need Chem-12, CBC in about 1 week and further neurocognitive testing as per Dr. 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 *   Hospital Course:  76 y.o. 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 Krista Blue of neurology-he has had a previous CVA 04/24/2020   patient presented secondary to agitation and aggression 2/10 to the [facility] long ED--- patient reportedly got upset at the facility punched the wall with his left hand and had laceration repairs,  While still in the ED on 2/13 he suffered an aspiration event and was admitted.  Medically stable as of 2/28 to be transferred to memory care unit at ALF.  Assessment and Plan: * Aspiration into airway, initial encounter- (present on admission) Patient presented secondary to Unasyn was started but without  evidence of actual aspiration pneumonia this was discontinued placed on dysphagia 3 diet Speech therapy should continue at the facility  Lethargy- (present on admission) Unsure of etiology. ? 2/2 Cogentin. No focal findings. Patient with a listed history of epilepsy--no seizure activity felt?  Postictal EEG obtained without active evidence of seizure activity. Ammonia normal. Lethargy resolved 2/17.  History of CVA (cerebrovascular accident) Not on antiplatelet therapy. Patient possibly with resultant dysphagia. Noted.  Dementia with behavioral disturbance- (present on admission) Complicated recent hospitalization secondary to behavioral disorder. was discharged on Depakote 500 mg TID, Zyprexa 5 mg BID, Zyprexa 2.5 mg BID PRN agitation and Cogentin 0.5 mg BID.   out of the restraints since 2/22. Ambulating multiple times in the hallway with a sitter. Has been out of the restraints since 2/22. Psychiatry was re-consulted 3/7, recommendations: Zyprexa 5 mg 3 times daily Depakote 500 mg a.m. p.m. and 750 mg at night. Cogentin discontinued.  Lithium discontinued.   Wernicke's disease- (present on admission) Patient with recent prolonged hospitalization. Secondary to prolonged alcohol use. -Continue MVI, thiamine  Drug-induced parkinsonism ([place])- (present on admission) Noted. Stable Continue carbidopa-levodopa 25-100 mg TID  Hypothyroidism- (present on admission) -Continue Synthroid 75 mcg daily  Essential hypertension- (present on admission) Blood pressure now stable. Started on Norvasc 5 given high blood pressures this hospital stay   Consultants: neuro/psych Procedures performed:   Disposition: Assisted living Diet recommendation:  Discharge Diet Orders (From admission, onward)     Start     Ordered   07/04/21 0000  Diet - low sodium heart healthy        07/04/21 0848  Cardiac and Carb modified diet  DISCHARGE MEDICATION: Allergies as of 07/10/2021   No  Known Allergies      Medication List     STOP taking these medications    benztropine 0.5 MG tablet Commonly known as: COGENTIN   diclofenac Sodium 1 % Gel Commonly known as: VOLTAREN   folic acid 1 MG tablet Commonly known as: FOLVITE   lithium carbonate 300 MG capsule   melatonin 3 MG Tabs tablet   polyethylene glycol 17 g packet Commonly known as: MIRALAX / GLYCOLAX   risperiDONE 0.5 MG tablet Commonly known as: RISPERDAL       TAKE these medications    acetaminophen 325 MG tablet Commonly known as: TYLENOL Take 2 tablets (650 mg total) by mouth every 6 (six) hours as needed for mild pain (or Fever >/= 101).   amLODipine 5 MG tablet Commonly known as: NORVASC Take 1 tablet (5 mg total) by mouth daily.   carbidopa-levodopa 25-100 MG tablet Commonly known as: SINEMET IR Take 0.5 tablets by mouth 3 (three) times daily. What changed: how much to take   divalproex 500 MG DR tablet Commonly known as: DEPAKOTE Take 1 tablet (500 mg total) by mouth 2 (two) times daily. What changed: when to take this   divalproex 250 MG DR tablet Commonly known as: DEPAKOTE Take 1 tablet (250 mg total) by mouth at bedtime. What changed: You were already taking a medication with the same name, and this prescription was added. Make sure you understand how and when to take each.   lactobacillus acidophilus Tabs tablet Take 2 tablets by mouth 3 (three) times daily.   levothyroxine 75 MCG tablet Commonly known as: SYNTHROID Take 75 mcg by mouth daily before breakfast. What changed: Another medication with the same name was removed. Continue taking this medication, and follow the directions you see here.   memantine 5 MG tablet Commonly known as: NAMENDA Take 1 tablet (5 mg total) by mouth at bedtime.   multivitamin with minerals Tabs tablet Take 1 tablet by mouth daily.   OLANZapine 5 MG tablet Commonly known as: ZYPREXA Take 1 tablet (5 mg total) by mouth 3 (three)  times daily for 18 doses. What changed:  when to take this Another medication with the same name was removed. Continue taking this medication, and follow the directions you see here.   propranolol 10 MG tablet Commonly known as: INDERAL Take 1 tablet (10 mg total) by mouth 2 (two) times daily.   thiamine 100 MG tablet Take 1 tablet (100 mg total) by mouth daily.         Discharge Exam: Filed Weights   07/05/21 0035  Weight: 97.9 kg   Awake coherent but somewhat slurred speech no distress Able to sit up Some drool Chest clear Abdomen soft ROM intact No lower extremity edema No wound No skin tear   Condition at discharge: fair  The results of significant diagnostics from this hospitalization (including imaging, microbiology, ancillary and laboratory) are listed below for reference.   Imaging Studies: DG Wrist Complete Left  Result Date: 06/15/2021 CLINICAL DATA:  Possible retained glass after trauma. EXAM: LEFT WRIST - COMPLETE 3+ VIEW COMPARISON:  None. FINDINGS: There is no evidence of fracture or dislocation. There is no evidence of arthropathy or other focal bone abnormality. Soft tissues are unremarkable. No radiopaque foreign body is noted. IMPRESSION: Negative. Electronically Signed   By: Marijo Conception M.D.   On: 06/15/2021 15:00   CT HEAD  WO CONTRAST (5MM)  Result Date: 06/17/2021 CLINICAL DATA:  Mental status changes. EXAM: CT HEAD WITHOUT CONTRAST TECHNIQUE: Contiguous axial images were obtained from the base of the skull through the vertex without intravenous contrast. RADIATION DOSE REDUCTION: This exam was performed according to the departmental dose-optimization program which includes automated exposure control, adjustment of the mA and/or kV according to patient size and/or use of iterative reconstruction technique. COMPARISON:  03/22/2021 FINDINGS: Brain: There is no evidence for acute hemorrhage, hydrocephalus, mass lesion, or abnormal extra-axial fluid  collection. No definite CT evidence for acute infarction. Diffuse loss of parenchymal volume is consistent with atrophy. Patchy low attenuation in the deep hemispheric and periventricular white matter is nonspecific, but likely reflects chronic microvascular ischemic demyelination. Stable left cerebellar lacunar infarct. Vascular: Choose 1 Skull: No evidence for fracture. No worrisome lytic or sclerotic lesion. Sinuses/Orbits: The visualized paranasal sinuses and mastoid air cells are clear. Visualized portions of the globes and intraorbital fat are unremarkable. Other: None. IMPRESSION: 1. No acute intracranial abnormality. 2. Atrophy with chronic small vessel ischemic disease. Electronically Signed   By: Misty Stanley M.D.   On: 06/17/2021 10:24   DG Chest Port 1 View  Result Date: 06/18/2021 CLINICAL DATA:  Shortness of breath EXAM: PORTABLE CHEST 1 VIEW COMPARISON:  11/09/2020 FINDINGS: Cardiomegaly and aortic tortuosity. There is no edema, consolidation, effusion, or pneumothorax. IMPRESSION: No evidence of acute disease. Electronically Signed   By: Jorje Guild M.D.   On: 06/18/2021 07:27   DG Abd Portable 1V  Result Date: 06/27/2021 CLINICAL DATA:  Abdominal pain and discomfort EXAM: PORTABLE ABDOMEN - 1 VIEW COMPARISON:  None. FINDINGS: Scattered large and small bowel gas is noted. Fecal material is noted throughout colon consistent with a degree of colonic constipation. No obstructive changes are noted. No free air is seen. Mild degenerative changes of lumbar spine are noted. Aortic calcifications are seen as well. IMPRESSION: Mild colonic constipation. Electronically Signed   By: Inez Catalina M.D.   On: 06/27/2021 20:14   EEG adult  Result Date: 06/21/2021 Greta Doom, MD     06/21/2021  6:19 PM History: 76 yo M being evaluated for lethargy Sedation: none Technique: This EEG was acquired with electrodes placed according to the International 10-20 electrode system (including Fp1,  Fp2, F3, F4, C3, C4, P3, P4, O1, O2, T3, T4, T5, T6, A1, A2, Fz, Cz, Pz). The following electrodes were missing or displaced: none. Background: The background consists of intermixed alpha and beta activities. There is a well defined posterior dominant rhythm of 9 Hz that attenuates with eye opening. With drowsiness there is anterior shifting of the PDR, but sleep is not recorded. Photic stimulation: Physiologic driving is not performed EEG Abnormalities: none Clinical Interpretation: This normal EEG is recorded in the waking and drowsy state. There was no seizure or seizure predisposition recorded on this study. Please note that lack of epileptiform activity on EEG does not preclude the possibility of epilepsy. Roland Rack, MD [facility] [phone] If 7pm- 7am, please page neurology on call as listed in [place].    Microbiology: Results for orders placed or performed during the hospital encounter of 06/15/21  Resp Panel by RT-PCR (Flu A&B, Covid) Nasopharyngeal Swab     Status: None   Collection Time: 06/15/21  2:37 PM   Specimen: Nasopharyngeal Swab; Nasopharyngeal(NP) swabs in vial transport medium  Result Value Ref Range Status   SARS Coronavirus 2 by RT PCR NEGATIVE NEGATIVE Final    Comment: (NOTE)  SARS-CoV-2 target nucleic acids are NOT DETECTED.  The SARS-CoV-2 RNA is generally detectable in upper respiratory specimens during the acute phase of infection. The lowest concentration of SARS-CoV-2 viral copies this assay can detect is 138 copies/mL. A negative result does not preclude SARS-Cov-2 infection and should not be used as the sole basis for treatment or other patient management decisions. A negative result may occur with  improper specimen collection/handling, submission of specimen other than nasopharyngeal swab, presence of viral mutation(s) within the areas targeted by this assay, and inadequate number of viral copies(<138 copies/mL). A negative result must be  combined with clinical observations, patient history, and epidemiological information. The expected result is Negative.  Fact Sheet for Patients:  EntrepreneurPulse.com.au  Fact Sheet for Healthcare Providers:  IncredibleEmployment.be  This test is no t yet approved or cleared by the [place] FDA and  has been authorized for detection and/or diagnosis of SARS-CoV-2 by FDA under an Emergency Use Authorization (EUA). This EUA will remain  in effect (meaning this test can be used) for the duration of the COVID-19 declaration under Section 564(b)(1) of the Act, 21 U.S.C.section 360bbb-3(b)(1), unless the authorization is terminated  or revoked sooner.       Influenza A by PCR NEGATIVE NEGATIVE Final   Influenza B by PCR NEGATIVE NEGATIVE Final    Comment: (NOTE) The Xpert Xpress SARS-CoV-2/FLU/RSV plus assay is intended as an aid in the diagnosis of influenza from Nasopharyngeal swab specimens and should not be used as a sole basis for treatment. Nasal washings and aspirates are unacceptable for Xpert Xpress SARS-CoV-2/FLU/RSV testing.  Fact Sheet for Patients: EntrepreneurPulse.com.au  Fact Sheet for Healthcare Providers: IncredibleEmployment.be  This test is not yet approved or cleared by the [place] FDA and has been authorized for detection and/or diagnosis of SARS-CoV-2 by FDA under an Emergency Use Authorization (EUA). This EUA will remain in effect (meaning this test can be used) for the duration of the COVID-19 declaration under Section 564(b)(1) of the Act, 21 U.S.C. section 360bbb-3(b)(1), unless the authorization is terminated or revoked.  Performed at [facility], [place] [address] [place], [place] [address]   Resp Panel by RT-PCR (Flu A&B, Covid)     Status: None   Collection Time: 06/18/21  7:34 AM   Specimen: Nasopharyngeal(NP) swabs in vial transport medium   Result Value Ref Range Status   SARS Coronavirus 2 by RT PCR NEGATIVE NEGATIVE Final    Comment: (NOTE) SARS-CoV-2 target nucleic acids are NOT DETECTED.  The SARS-CoV-2 RNA is generally detectable in upper respiratory specimens during the acute phase of infection. The lowest concentration of SARS-CoV-2 viral copies this assay can detect is 138 copies/mL. A negative result does not preclude SARS-Cov-2 infection and should not be used as the sole basis for treatment or other patient management decisions. A negative result may occur with  improper specimen collection/handling, submission of specimen other than nasopharyngeal swab, presence of viral mutation(s) within the areas targeted by this assay, and inadequate number of viral copies(<138 copies/mL). A negative result must be combined with clinical observations, patient history, and epidemiological information. The expected result is Negative.  Fact Sheet for Patients:  EntrepreneurPulse.com.au  Fact Sheet for Healthcare Providers:  IncredibleEmployment.be  This test is no t yet approved or cleared by the [place] FDA and  has been authorized for detection and/or diagnosis of SARS-CoV-2 by FDA under an Emergency Use Authorization (EUA). This EUA will remain  in effect (meaning this test can be  used) for the duration of the COVID-19 declaration under Section 564(b)(1) of the Act, 21 U.S.C.section 360bbb-3(b)(1), unless the authorization is terminated  or revoked sooner.       Influenza A by PCR NEGATIVE NEGATIVE Final   Influenza B by PCR NEGATIVE NEGATIVE Final    Comment: (NOTE) The Xpert Xpress SARS-CoV-2/FLU/RSV plus assay is intended as an aid in the diagnosis of influenza from Nasopharyngeal swab specimens and should not be used as a sole basis for treatment. Nasal washings and aspirates are unacceptable for Xpert Xpress SARS-CoV-2/FLU/RSV testing.  Fact Sheet for  Patients: EntrepreneurPulse.com.au  Fact Sheet for Healthcare Providers: IncredibleEmployment.be  This test is not yet approved or cleared by the [place] FDA and has been authorized for detection and/or diagnosis of SARS-CoV-2 by FDA under an Emergency Use Authorization (EUA). This EUA will remain in effect (meaning this test can be used) for the duration of the COVID-19 declaration under Section 564(b)(1) of the Act, 21 U.S.C. section 360bbb-3(b)(1), unless the authorization is terminated or revoked.  Performed at [facility], [place] [address], [place], [place] [address]   Culture, blood (routine x 2)     Status: None   Collection Time: 06/18/21  7:34 AM   Specimen: BLOOD  Result Value Ref Range Status   Specimen Description   Final    BLOOD LEFT ANTECUBITAL Performed at [place] [address], [place], [place] [address]    Special Requests   Final    BOTTLES DRAWN AEROBIC AND ANAEROBIC Blood Culture adequate volume Performed at [place] [address], [place], [place] [address]    Culture   Final    NO GROWTH 5 DAYS Performed at [facility], [place] [address], [place], [place] [address]    Report Status 06/23/2021 FINAL  Final  Culture, blood (routine x 2)     Status: None   Collection Time: 06/18/21  7:34 AM   Specimen: BLOOD  Result Value Ref Range Status   Specimen Description   Final    BLOOD RIGHT ANTECUBITAL Performed at [place] [address] [place], [place] [address]    Special Requests   Final    BOTTLES DRAWN AEROBIC AND ANAEROBIC Blood Culture adequate volume Performed at [place] [address], [place], [place] [address]    Culture   Final    NO GROWTH 5 DAYS Performed at [facility], [place] [address] [place], [place] [address]    Report Status 06/23/2021 FINAL  Final     Labs: CBC: Recent Labs  Lab 07/04/21 0503 07/07/21 0511  WBC 6.7 6.8  NEUTROABS  --  2.3  HGB 12.4* 11.6*  HCT 39.1 36.5*  MCV 101.3* 100.8*  PLT 246 [id number]    Basic Metabolic Panel: Recent Labs  Lab 07/04/21 0503 07/06/21 2126 07/07/21 0511  NA 138  --  136  K 4.3  --  4.0  CL 105  --  101  CO2 28  --  30  GLUCOSE 83  --  86  BUN 15  --  17  CREATININE 0.78 0.81 0.74  CALCIUM 8.9  --  9.0    Liver Function Tests: Recent Labs  Lab 07/07/21 0511  AST 21  ALT 13  ALKPHOS 45  BILITOT 0.3  PROT 6.0*  ALBUMIN 3.4*   CBG: No results for input(s): GLUCAP in the last 168 hours.  Discharge time spent: greater  than 30 minutes.  Signed: Nita Sells, MD [facility] 07/10/2021

## 2021-07-10 NOTE — Plan of Care (Signed)
No acute events overnight.    Problem: Education: Goal: Knowledge of General Education information will improve Description: Including pain rating scale, medication(s)/side effects and non-pharmacologic comfort measures Outcome: Progressing   Problem: Health Behavior/Discharge Planning: Goal: Ability to manage health-related needs will improve Outcome: Progressing   Problem: Clinical Measurements: Goal: Ability to maintain clinical measurements within normal limits will improve Outcome: Progressing Goal: Will remain free from infection Outcome: Progressing Goal: Diagnostic test results will improve Outcome: Progressing Goal: Respiratory complications will improve Outcome: Progressing Goal: Cardiovascular complication will be avoided Outcome: Progressing   Problem: Activity: Goal: Risk for activity intolerance will decrease Outcome: Progressing   Problem: Nutrition: Goal: Adequate nutrition will be maintained Outcome: Progressing   Problem: Coping: Goal: Level of anxiety will decrease Outcome: Progressing   Problem: Elimination: Goal: Will not experience complications related to bowel motility Outcome: Progressing Goal: Will not experience complications related to urinary retention Outcome: Progressing   Problem: Pain Managment: Goal: General experience of comfort will improve Outcome: Progressing   Problem: Safety: Goal: Ability to remain free from injury will improve Outcome: Progressing   Problem: Skin Integrity: Goal: Risk for impaired skin integrity will decrease Outcome: Progressing   Problem: Education: Goal: Knowledge of General Education information will improve Description: Including pain rating scale, medication(s)/side effects and non-pharmacologic comfort measures Outcome: Progressing   Problem: Health Behavior/Discharge Planning: Goal: Ability to manage health-related needs will improve Outcome: Progressing   Problem: Clinical  Measurements: Goal: Ability to maintain clinical measurements within normal limits will improve Outcome: Progressing Goal: Will remain free from infection Outcome: Progressing Goal: Diagnostic test results will improve Outcome: Progressing Goal: Respiratory complications will improve Outcome: Progressing Goal: Cardiovascular complication will be avoided Outcome: Progressing   Problem: Activity: Goal: Risk for activity intolerance will decrease Outcome: Progressing   Problem: Nutrition: Goal: Adequate nutrition will be maintained Outcome: Progressing   Problem: Coping: Goal: Level of anxiety will decrease Outcome: Progressing   Problem: Elimination: Goal: Will not experience complications related to bowel motility Outcome: Progressing Goal: Will not experience complications related to urinary retention Outcome: Progressing   Problem: Pain Managment: Goal: General experience of comfort will improve Outcome: Progressing   Problem: Safety: Goal: Ability to remain free from injury will improve Outcome: Progressing   Problem: Skin Integrity: Goal: Risk for impaired skin integrity will decrease Outcome: Progressing   

## 2021-07-10 NOTE — NC FL2 (Signed)
?[place] MEDICAID FL2 LEVEL OF CARE SCREENING TOOL  ?  ? ?IDENTIFICATION  ?Patient Name: ?Trevor Cook Birthdate: 10-28-1945 Sex: male Admission Date (Current Location): ?06/15/2021  ?[place] and [place] Number: ? [place] ?  Facility and Address:  ?[facility],  [address], [place] ?     Provider Number: ?[id number]  ?Attending Physician Name and Address:  ?Nita Sells, MD ? Relative Name and Phone Number:  ?Meshulem, Dunagan)   [phone] ( ?   ?Current Level of Care: ?Hospital Recommended Level of Care: ?Other (Comment) ([facility]) Prior Approval Number: ?  ? ?Date Approved/Denied: ?  PASRR Number: ?  ? ?Discharge Plan: ?Other (Comment) (memory care at [facility]) ?  ? ?Current Diagnoses: ?Patient Active Problem List  ? Diagnosis Date Noted  ? Lethargy 06/21/2021  ? Wernicke's disease 06/20/2021  ? Dementia with behavioral disturbance 06/20/2021  ? History of CVA (cerebrovascular accident) 06/20/2021  ? Aspiration into airway, initial encounter 06/18/2021  ? Macrocytic anemia 06/18/2021  ? Hyperglycemia 06/18/2021  ? 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 Acute metabolic encephalopathy 123XX123  ? Encephalopathy 05/13/2020  ? Toxic metabolic encephalopathy 123XX123  ? Drug-induced parkinsonism ([place])   ? Fall   ? History of alcohol abuse 04/07/2020  ? Tremor 09/20/2013  ? Essential hypertension 09/20/2013  ? Hypothyroidism 09/20/2013  ? 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 ? ?Orientation RESPIRATION BLADDER Height & Weight   ?  ?Self, Place ? Normal Incontinent Weight: 97.9 kg ?Height:  5\' 7"  (170.2 cm)  ?BEHAVIORAL SYMPTOMS/MOOD NEUROLOGICAL BOWEL NUTRITION STATUS  ?    Continent Diet (Mechanical Soft with feeding supervision)  ?AMBULATORY STATUS COMMUNICATION OF NEEDS Skin   ?Supervision Verbally Other (Comment) (Skin tear, L Hand, posterior) ?  ?  ?  ?    ?     ?     ? ? ?Personal Care Assistance Level of Assistance   ?Bathing, Feeding, Dressing Bathing Assistance: Limited assistance ?Feeding assistance: Limited assistance ?Dressing Assistance: Limited assistance ?   ? ?Functional Limitations Info  ?Sight, Hearing, Speech Sight Info: Adequate ?Hearing Info: Adequate ?Speech Info: Adequate  ? ? ?SPECIAL CARE FACTORS FREQUENCY  ?    ?  ?  ?  ?  ?  ?  ?   ? ? ?Contractures Contractures Info: Not present  ? ? ?Additional Factors Info  ?Code Status Code Status Info: DNR ?  ?  ?  ?  ?   ? ? ? ?Discharge Medications: ?acetaminophen 325 MG tablet ?Commonly known as: TYLENOL ?Take 2 tablets (650 mg total) by mouth every 6 (six) hours as needed for mild pain (or Fever >/= 101).          ?Icon medications to start taking   amLODipine 5 MG tablet ?Commonly known as: NORVASC ?Take 1 tablet (5 mg total) by mouth daily.         ? benztropine 0.5 MG tablet ?Commonly known as: COGENTIN ?Take 1 tablet (0.5 mg total) by mouth 2 (two) times daily for 20 days.         ?Icon medications to change how you take   carbidopa-levodopa 25-100 MG tablet ?Commonly known as: SINEMET IR ?Take 0.5 tablets by mouth 3 (three) times daily. ?What changed: how much to take         ?Icon medications to change how you take   * divalproex 500 MG DR tablet ?Commonly known as: DEPAKOTE ?Take 1 tablet (500 mg total) by  mouth 2 (two) times daily. ?What changed: when to take this         ?Icon medications to change how you take   * divalproex 250 MG DR tablet ?Commonly known as: DEPAKOTE ?Take 1 tablet (250 mg total) by mouth at bedtime. ?What changed: You were already taking a medication with the same name, and this prescription was added. Make sure you understand how and when to take each.         ? lactobacillus acidophilus Tabs tablet ?Take 2 tablets by mouth 3 (three) times daily.         ?Icon medications to change how you take   levothyroxine 75 MCG tablet ?Commonly known as: SYNTHROID ?Take 75 mcg by mouth daily before breakfast. ?What changed: Another medication  with the same name was removed. Continue taking this medication, and follow the directions you see here.         ?Icon medications to start taking   memantine 5 MG tablet ?Commonly known as: NAMENDA ?Take 1 tablet (5 mg total) by mouth at bedtime.         ? multivitamin with minerals Tabs tablet ?Take 1 tablet by mouth daily.         ?Icon medications to change how you take   OLANZapine 5 MG tablet ?Commonly known as: ZYPREXA ?Take 1 tablet (5 mg total) by mouth 3 (three) times daily for 18 doses. ?What changed:  ?when to take this ?Another medication with the same name was removed. Continue taking this medication, and follow the directions you see here.         ?Icon medications to start taking   propranolol 10 MG tablet ?Commonly known as: INDERAL ?Take 1 tablet (10 mg total) by mouth 2 (two) times daily.         ? thiamine 100 MG tablet ?Take 1 tablet (100 mg total) by mouth daily.         ? ? ? ? ?Relevant Imaging Results: ? ?Relevant Lab Results: ? ? ?Additional Information ?SSN; [id number] ? ?Trish Mage, LCSW ? ? ? ? ?

## 2021-07-10 NOTE — NC FL2 (Signed)
?[place] MEDICAID FL2 LEVEL OF CARE SCREENING TOOL  ?  ? ?IDENTIFICATION  ?Patient Name: ?Trevor Cook Birthdate: Sep 16, 1945 Sex: male Admission Date (Current Location): ?06/15/2021  ?[place] Number: ? [place] ?  Facility and Address:  ?[facility],  [address], [place] [address] ?     Provider Number: ?[id number]  ?Attending Physician Name and Address:  ?Rhetta Mura, MD ? Relative Name and Phone Number:  ?Sani, Madariaga)   [phone] ( ?   ?Current Level of Care: ?Hospital Recommended Level of Care: ?Other (Comment) ([facility]) Prior Approval Number: ?  ? ?Date Approved/Denied: ?  PASRR Number: ?  ? ?Discharge Plan: ?Other (Comment) (memory care at [facility]) ?  ? ?Current Diagnoses: ?Patient Active Problem List  ? Diagnosis Date Noted  ? Lethargy 06/21/2021  ? Wernicke's disease 06/20/2021  ? Dementia with behavioral disturbance 06/20/2021  ? History of CVA (cerebrovascular accident) 06/20/2021  ? Aspiration into airway, initial encounter 06/18/2021  ? Macrocytic anemia 06/18/2021  ? Hyperglycemia 06/18/2021  ? 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[facility]) 06/18/2021  ? Acute metabolic encephalopathy 05/13/2020  ? Encephalopathy 05/13/2020  ? Toxic metabolic encephalopathy 05/13/2020  ? Drug-induced parkinsonism ([facility])   ? Fall   ? History of alcohol abuse 04/07/2020  ? Tremor 09/20/2013  ? Essential hypertension 09/20/2013  ? Hypothyroidism 09/20/2013  ? 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 ? ?Orientation RESPIRATION BLADDER Height & Weight   ?  ?Self, Place ? Normal Incontinent Weight: 97.9 kg ?Height:  5\' 7"  (170.2 cm)  ?BEHAVIORAL SYMPTOMS/MOOD NEUROLOGICAL BOWEL NUTRITION STATUS  ?    Continent Diet (Mechanical Soft with feeding supervision)  ?AMBULATORY STATUS COMMUNICATION OF NEEDS Skin   ?Supervision Verbally Other (Comment) (Skin tear, L Hand, posterior) ?  ?  ?  ?    ?     ?     ? ? ?Personal Care Assistance Level of Assistance   ?Bathing, Feeding, Dressing Bathing Assistance: Limited assistance ?Feeding assistance: Limited assistance ?Dressing Assistance: Limited assistance ?   ? ?Functional Limitations Info  ?Sight, Hearing, Speech Sight Info: Adequate ?Hearing Info: Adequate ?Speech Info: Adequate  ? ? ?SPECIAL CARE FACTORS FREQUENCY  ?    ?  ?  ?  ?  ?  ?  ?   ? ? ?Contractures Contractures Info: Not present  ? ? ?Additional Factors Info  ?Code Status Code Status Info: DNR ?  ?  ?  ?  ?   ? ? ?Discharge Medications: ? acetaminophen 325 MG tablet ?Commonly known as: TYLENOL ?Take 2 tablets (650 mg total) by mouth every 6 (six) hours as needed for mild pain (or Fever >/= 101).         ?Icon medications to start taking   amLODipine 5 MG tablet ?Commonly known as: NORVASC ?Take 1 tablet (5 mg total) by mouth daily.         ?Icon medications to change how you take   carbidopa-levodopa 25-100 MG tablet ?Commonly known as: SINEMET IR ?Take 0.5 tablets by mouth 3 (three) times daily. ?What changed: how much to take         ?Icon medications to change how you take   * divalproex 500 MG DR tablet ?Commonly known as: DEPAKOTE ?Take 1 tablet (500 mg total) by mouth 2 (two) times daily. ?What changed: when to take this         ?Icon medications to change how you take   * divalproex 250 MG DR  tablet ?Commonly known as: DEPAKOTE ?Take 1 tablet (250 mg total) by mouth at bedtime. ?What changed: You were already taking a medication with the same name, and this prescription was added. Make sure you understand how and when to take each.         ? lactobacillus acidophilus Tabs tablet ?Take 2 tablets by mouth 3 (three) times daily.         ?Icon medications to change how you take   levothyroxine 75 MCG tablet ?Commonly known as: SYNTHROID ?Take 75 mcg by mouth daily before breakfast. ?What changed: Another medication with the same name was removed. Continue taking this medication, and follow the directions you see here.         ?Icon medications to start  taking   memantine 5 MG tablet ?Commonly known as: NAMENDA ?Take 1 tablet (5 mg total) by mouth at bedtime.         ? multivitamin with minerals Tabs tablet ?Take 1 tablet by mouth daily.         ?Icon medications to change how you take   OLANZapine 5 MG tablet ?Commonly known as: ZYPREXA ?Take 1 tablet (5 mg total) by mouth 3 (three) times daily for 18 doses. ?What changed:  ?when to take this ?Another medication with the same name was removed. Continue taking this medication, and follow the directions you see here.         ?Icon medications to start taking   propranolol 10 MG tablet ?Commonly known as: INDERAL ?Take 1 tablet (10 mg total) by mouth 2 (two) times daily.         ? thiamine 100 MG tablet ?Take 1 tablet (100 mg total) by mouth daily.         ? ? ? ?Relevant Imaging Results: ? ?Relevant Lab Results: ? ? ?Additional Information ?SSN; [id number] ? ?Ida Rogue, LCSW ? ? ? ? ?

## 2021-07-11 DIAGNOSIS — Y92128 Other place in nursing home as the place of occurrence of the external cause: Secondary | ICD-10-CM | POA: Diagnosis not present

## 2021-07-11 DIAGNOSIS — W25XXXA Contact with sharp glass, initial encounter: Secondary | ICD-10-CM | POA: Diagnosis not present

## 2021-07-11 DIAGNOSIS — X58XXXA Exposure to other specified factors, initial encounter: Secondary | ICD-10-CM | POA: Diagnosis not present

## 2021-07-11 DIAGNOSIS — Z23 Encounter for immunization: Secondary | ICD-10-CM | POA: Diagnosis present

## 2021-07-11 NOTE — Progress Notes (Signed)
Patient seen and examined this morning.  Alert awake able to tell me his name, tremulous. ?Discussed with social worker ?Patient can return back to the facility today ?Discharge date 07/11/2021 ?Discharge condition is stable ?Refer to discharge summary from 2/7 ? ? ?

## 2021-07-11 NOTE — Progress Notes (Signed)
Called report to [employer] at [facility]  ?

## 2021-07-11 NOTE — TOC Transition Note (Signed)
Transition of Care (TOC) - CM/SW Discharge Note ? ? ?Patient Details  ?Name: Wilmore Holsomback ?MRN: [id number] ?Date of Birth: 06/27/1945 ? ?Transition of Care (TOC) CM/SW Contact:  ?Ida Rogue, LCSW ?Phone Number: ?07/11/2021, 10:24 AM ? ? ?Clinical Narrative:   Patient who is medically stable will transfer back to [facility].  Family alerted.  PTAR arranged.  Nursing, please call report to [phone], ask for Crystal. No further needs identified.  TOC sign off. ? ? ? ?Final next level of care: Assisted Living ?Barriers to Discharge: Barriers Resolved ? ? ?Patient Goals and CMS Choice ?Patient states their goals for this hospitalization and ongoing recovery are:: Unable to answer ?  ?  ? ?Discharge Placement ?  ?           ?  ?  ?  ?  ? ?Discharge Plan and Services ?  ?  ?           ?  ?  ?  ?  ?  ?  ?  ?  ?  ?  ? ?Social Determinants of Health (SDOH) Interventions ?  ? ? ?Readmission Risk Interventions ?No flowsheet data found. ? ? ? ? ?

## 2022-04-18 ENCOUNTER — Emergency Department (HOSPITAL_COMMUNITY): Payer: Medicare Other

## 2022-04-18 ENCOUNTER — Encounter (HOSPITAL_COMMUNITY): Payer: Self-pay | Admitting: Internal Medicine

## 2022-04-18 ENCOUNTER — Other Ambulatory Visit: Payer: Self-pay

## 2022-04-18 ENCOUNTER — Inpatient Hospital Stay (HOSPITAL_COMMUNITY)
Admission: EM | Admit: 2022-04-18 | Discharge: 2022-05-07 | DRG: 312 | Disposition: A | Discharge status: Skilled Nursing Facility | Payer: Medicare Other | Source: Skilled Nursing Facility | Attending: Internal Medicine | Admitting: Internal Medicine

## 2022-04-18 DIAGNOSIS — R400 Somnolence: Secondary | ICD-10-CM | POA: Diagnosis present

## 2022-04-18 DIAGNOSIS — Z79890 Hormone replacement therapy (postmenopausal): Secondary | ICD-10-CM

## 2022-04-18 DIAGNOSIS — R55 Syncope and collapse: Secondary | ICD-10-CM | POA: Diagnosis not present

## 2022-04-18 DIAGNOSIS — G20A1 Parkinson's disease without dyskinesia, without mention of fluctuations: Secondary | ICD-10-CM | POA: Diagnosis present

## 2022-04-18 DIAGNOSIS — R471 Dysarthria and anarthria: Secondary | ICD-10-CM | POA: Diagnosis present

## 2022-04-18 DIAGNOSIS — E039 Hypothyroidism, unspecified: Secondary | ICD-10-CM | POA: Diagnosis present

## 2022-04-18 DIAGNOSIS — Y92129 Unspecified place in nursing home as the place of occurrence of the external cause: Secondary | ICD-10-CM

## 2022-04-18 DIAGNOSIS — I951 Orthostatic hypotension: Principal | ICD-10-CM | POA: Diagnosis present

## 2022-04-18 DIAGNOSIS — Z8249 Family history of ischemic heart disease and other diseases of the circulatory system: Secondary | ICD-10-CM

## 2022-04-18 DIAGNOSIS — S0083XA Contusion of other part of head, initial encounter: Secondary | ICD-10-CM

## 2022-04-18 DIAGNOSIS — Z87891 Personal history of nicotine dependence: Secondary | ICD-10-CM

## 2022-04-18 DIAGNOSIS — Z515 Encounter for palliative care: Secondary | ICD-10-CM

## 2022-04-18 DIAGNOSIS — E512 Wernicke's encephalopathy: Secondary | ICD-10-CM | POA: Diagnosis present

## 2022-04-18 DIAGNOSIS — F1097 Alcohol use, unspecified with alcohol-induced persisting dementia: Secondary | ICD-10-CM | POA: Diagnosis present

## 2022-04-18 DIAGNOSIS — Z8673 Personal history of transient ischemic attack (TIA), and cerebral infarction without residual deficits: Secondary | ICD-10-CM

## 2022-04-18 DIAGNOSIS — F0283 Dementia in other diseases classified elsewhere, unspecified severity, with mood disturbance: Secondary | ICD-10-CM | POA: Diagnosis present

## 2022-04-18 DIAGNOSIS — G4700 Insomnia, unspecified: Secondary | ICD-10-CM | POA: Diagnosis present

## 2022-04-18 DIAGNOSIS — Z781 Physical restraint status: Secondary | ICD-10-CM

## 2022-04-18 DIAGNOSIS — G40909 Epilepsy, unspecified, not intractable, without status epilepticus: Secondary | ICD-10-CM | POA: Diagnosis present

## 2022-04-18 DIAGNOSIS — R1310 Dysphagia, unspecified: Secondary | ICD-10-CM | POA: Diagnosis present

## 2022-04-18 DIAGNOSIS — Z1152 Encounter for screening for COVID-19: Secondary | ICD-10-CM

## 2022-04-18 DIAGNOSIS — W19XXXA Unspecified fall, initial encounter: Secondary | ICD-10-CM | POA: Diagnosis present

## 2022-04-18 DIAGNOSIS — F02811 Dementia in other diseases classified elsewhere, unspecified severity, with agitation: Secondary | ICD-10-CM | POA: Diagnosis present

## 2022-04-18 DIAGNOSIS — Z66 Do not resuscitate: Secondary | ICD-10-CM | POA: Diagnosis present

## 2022-04-18 DIAGNOSIS — Z79899 Other long term (current) drug therapy: Secondary | ICD-10-CM

## 2022-04-18 DIAGNOSIS — F319 Bipolar disorder, unspecified: Secondary | ICD-10-CM | POA: Diagnosis present

## 2022-04-18 LAB — CBC
HCT: 37 % — ABNORMAL LOW (ref 39.0–52.0)
Hemoglobin: 12.5 g/dL — ABNORMAL LOW (ref 13.0–17.0)
MCH: 33.4 pg (ref 26.0–34.0)
MCHC: 33.8 g/dL (ref 30.0–36.0)
MCV: 98.9 fL (ref 80.0–100.0)
Platelets: 224 10*3/uL (ref 150–400)
RBC: 3.74 MIL/uL — ABNORMAL LOW (ref 4.22–5.81)
RDW: 12.7 % (ref 11.5–15.5)
WBC: 7.7 10*3/uL (ref 4.0–10.5)
nRBC: 0 % (ref 0.0–0.2)

## 2022-04-18 LAB — BASIC METABOLIC PANEL
Anion gap: 6 (ref 5–15)
BUN: 21 mg/dL (ref 8–23)
CO2: 30 mmol/L (ref 22–32)
Calcium: 9.3 mg/dL (ref 8.9–10.3)
Chloride: 103 mmol/L (ref 98–111)
Creatinine, Ser: 0.54 mg/dL — ABNORMAL LOW (ref 0.61–1.24)
GFR, Estimated: >60 mL/min (ref >60)
Glucose, Bld: 98 mg/dL (ref 70–99)
Potassium: 4 mmol/L (ref 3.5–5.1)
Sodium: 139 mmol/L (ref 135–145)

## 2022-04-18 LAB — VALPROIC ACID LEVEL: Valproic Acid Lvl: 40 ug/mL — ABNORMAL LOW (ref 50.0–100.0)

## 2022-04-18 NOTE — ED Notes (Addendum)
RN called family to update about the patient.

## 2022-04-18 NOTE — ED Triage Notes (Signed)
Patient BIB EMS from SNF [facility]) c/o Unwitnessed fall. Per report pt was found laying on his right side in the floor. Pt report pain on his right side of the face and head. Patient not taking blood thinners. Pt hx of dementia.

## 2022-04-18 NOTE — ED Provider Notes (Signed)
[facility] Provider Note   CSN: [id number] Arrival date & time: 04/18/22  1608     History  Chief Complaint  Patient presents with   Marletta LorFall    Trevor GuernseyJohn Cook is a 76 y.o. male.   Fall   Patient presented to the ED for evaluation after a fall.  Patient has history of bipolar disorder, alcohol induced dementia, tremors, Warnicke's encephalopathy, epilepsy, prior strokes who presents to the ED for evaluation after fall.  Patient resides at a skilled nursing facility.  He had an unwitnessed fall.  They found the patient lying on his right side on the floor.  Patient reports some pain on the right side of his face and head.  Patient is able to answer some questions but with his chronic neurologic abnormalities it is difficult to understand him.  Family was able to provide additional history when they arrived.  They indicated that the patient states he passed out today.      Home Medications Prior to Admission medications   Medication Sig Start Date End Date Taking? Authorizing Provider  acetaminophen (TYLENOL) 325 MG tablet Take 2 tablets (650 mg total) by mouth every 6 (six) hours as needed for mild pain (or Fever >/= 101). 07/04/21   Rhetta MuraSamtani, Jai-Gurmukh, MD  amLODipine (NORVASC) 5 MG tablet Take 1 tablet (5 mg total) by mouth daily. 07/04/21   Rhetta MuraSamtani, Jai-Gurmukh, MD  carbidopa-levodopa (SINEMET IR) 25-100 MG tablet Take 0.5 tablets by mouth 3 (three) times daily. 07/10/21   Rhetta MuraSamtani, Jai-Gurmukh, MD  divalproex (DEPAKOTE) 250 MG DR tablet Take 1 tablet (250 mg total) by mouth at bedtime. 07/10/21   Rhetta MuraSamtani, Jai-Gurmukh, MD  divalproex (DEPAKOTE) 500 MG DR tablet Take 1 tablet (500 mg total) by mouth 2 (two) times daily. 07/10/21   Rhetta MuraSamtani, Jai-Gurmukh, MD  lactobacillus acidophilus (BACID) TABS tablet Take 2 tablets by mouth 3 (three) times daily.    [provider]  levothyroxine (SYNTHROID) 75 MCG tablet Take 75 mcg by mouth daily before  breakfast. 06/15/21   [provider]  memantine (NAMENDA) 5 MG tablet Take 1 tablet (5 mg total) by mouth at bedtime. 07/10/21   Rhetta MuraSamtani, Jai-Gurmukh, MD  Multiple Vitamin (MULTIVITAMIN WITH MINERALS) TABS tablet Take 1 tablet by mouth daily.    [provider]  OLANZapine (ZYPREXA) 5 MG tablet Take 1 tablet (5 mg total) by mouth 3 (three) times daily for 18 doses. 07/10/21 07/16/21  Rhetta MuraSamtani, Jai-Gurmukh, MD  propranolol (INDERAL) 10 MG tablet Take 1 tablet (10 mg total) by mouth 2 (two) times daily. 07/10/21   Rhetta MuraSamtani, Jai-Gurmukh, MD  thiamine 100 MG tablet Take 1 tablet (100 mg total) by mouth daily. 05/17/20   Joseph ArtVann, Jessica U, DO      Allergies    Patient has no known allergies.    Review of Systems   Review of Systems  Physical Exam Updated Vital Signs BP 118/78 (BP Location: Left Arm)   Pulse (!) 58   Temp 97.8 F (36.6 C) (Oral)   Resp 18   SpO2 100%  Physical Exam Vitals and nursing note reviewed.  Constitutional:      Appearance: He is well-developed. He is not diaphoretic.  HENT:     Head: Normocephalic.     Comments: Contusion right cheek, mild tenderness palpation    Right Ear: External ear normal.     Left Ear: External ear normal.  Eyes:     General: No scleral icterus.  Right eye: No discharge.        Left eye: No discharge.     Conjunctiva/sclera: Conjunctivae normal.  Neck:     Trachea: No tracheal deviation.  Cardiovascular:     Rate and Rhythm: Normal rate and regular rhythm.  Pulmonary:     Effort: Pulmonary effort is normal. No respiratory distress.     Breath sounds: Normal breath sounds. No stridor. No wheezing or rales.  Abdominal:     General: Bowel sounds are normal. There is no distension.     Palpations: Abdomen is soft.     Tenderness: There is no abdominal tenderness. There is no guarding or rebound.  Musculoskeletal:        General: No tenderness or deformity.     Cervical back: Neck supple. No tenderness.     Thoracic  back: No tenderness.     Lumbar back: No tenderness.     Comments: Patient in a cervical collar  Skin:    General: Skin is warm and dry.     Findings: No rash.  Neurological:     General: No focal deficit present.     Mental Status: He is alert.     Cranial Nerves: Dysarthria present. No facial asymmetry.     Sensory: No sensory deficit.     Motor: Weakness and tremor present. No abnormal muscle tone or seizure activity.     Coordination: Coordination normal.     Comments: Generalized weakness, movements low, patient does follow commands speech is hard to understand but does answer questions  Psychiatric:        Mood and Affect: Mood normal.     ED Results / Procedures / Treatments   Labs (all labs ordered are listed, but only abnormal results are displayed) Labs Reviewed  CBC - Abnormal; Notable for the following components:      Result Value   RBC 3.74 (*)    Hemoglobin 12.5 (*)    HCT 37.0 (*)    All other components within normal limits  BASIC METABOLIC PANEL - Abnormal; Notable for the following components:   Creatinine, Ser 0.54 (*)    All other components within normal limits  VALPROIC ACID LEVEL - Abnormal; Notable for the following components:   Valproic Acid Lvl 40 (*)    All other components within normal limits    EKG EKG Interpretation  Date/Time:  Thursday April 18 2022 16:30:01 EST Ventricular Rate:  62 PR Interval:  193 QRS Duration: 90 QT Interval:  404 QTC Calculation: 411 R Axis:   41 Text Interpretation: Sinus rhythm Atrial premature complex Since last tracing rate faster Confirmed by Linwood Dibbles [id number]) on 04/18/2022 4:40:33 PM  Radiology CT Maxillofacial Wo Contrast  Result Date: 04/18/2022 CLINICAL DATA:  Facial trauma, blunt; Head trauma, moderate-severe; Neck trauma (Age >= 65y) EXAM: CT HEAD WITHOUT CONTRAST CT MAXILLOFACIAL WITHOUT CONTRAST CT CERVICAL SPINE WITHOUT CONTRAST TECHNIQUE: Multidetector CT imaging of the head, cervical  spine, and maxillofacial structures were performed using the standard protocol without intravenous contrast. Multiplanar CT image reconstructions of the cervical spine and maxillofacial structures were also generated. RADIATION DOSE REDUCTION: This exam was performed according to the departmental dose-optimization program which includes automated exposure control, adjustment of the mA and/or kV according to patient size and/or use of iterative reconstruction technique. COMPARISON:  CT head 06/17/2021, CT cervical spine 05/12/2020 FINDINGS: CT HEAD FINDINGS Brain: Cerebral ventricle sizes are concordant with the degree of cerebral volume loss. Patchy and confluent areas of  decreased attenuation are noted throughout the deep and periventricular white matter of the cerebral hemispheres bilaterally, compatible with chronic microvascular ischemic disease. No evidence of large-territorial acute infarction. No parenchymal hemorrhage. No mass lesion. No extra-axial collection. No mass effect or midline shift. No hydrocephalus. Basilar cisterns are patent. Vascular: No hyperdense vessel. Atherosclerotic calcifications are present within the cavernous internal carotid and vertebral arteries. Skull: No acute fracture or focal lesion. Other: None. CT MAXILLOFACIAL FINDINGS Osseous: No fracture or mandibular dislocation. No destructive process. Sinuses/Orbits: Bilateral maxillary mucosal thickening. Otherwise paranasal sinuses and mastoid air cells are clear. Bilateral lens replacement. Otherwise the orbits are unremarkable. Soft tissues: Right maxillary subcutaneus soft tissue hematoma formation. CT CERVICAL SPINE FINDINGS Alignment: Straightening of the normal cervical lordosis likely due to positioning and degenerative changes. Stable grade 1 anterolisthesis of C7 on T1. Stable grade 1 anterolisthesis of C2 on C3. Skull base and vertebrae: Multilevel moderate degenerative changes of the spine. Associated at least moderate  multilevel osseous neural foraminal stenosis. No severe osseous central canal stenosis. No acute fracture. No aggressive appearing focal osseous lesion or focal pathologic process. Soft tissues and spinal canal: No prevertebral fluid or swelling. No visible canal hematoma. Upper chest: Unremarkable. Other: Atherosclerotic plaque of the carotid arteries within the neck. IMPRESSION: 1. No acute intracranial abnormality. 2. No acute displaced facial fracture. 3. No acute displaced fracture or traumatic listhesis of the cervical spine. 4. 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 Associated at least moderate multilevel osseous neural foraminal stenosis. Electronically Signed   By: Tish Frederickson M.D.   On: 04/18/2022 18:42   CT Head Wo Contrast  Result Date: 04/18/2022 CLINICAL DATA:  Facial trauma, blunt; Head trauma, moderate-severe; Neck trauma (Age >= 65y) EXAM: CT HEAD WITHOUT CONTRAST CT MAXILLOFACIAL WITHOUT CONTRAST CT CERVICAL SPINE WITHOUT CONTRAST TECHNIQUE: Multidetector CT imaging of the head, cervical spine, and maxillofacial structures were performed using the standard protocol without intravenous contrast. Multiplanar CT image reconstructions of the cervical spine and maxillofacial structures were also generated. RADIATION DOSE REDUCTION: This exam was performed according to the departmental dose-optimization program which includes automated exposure control, adjustment of the mA and/or kV according to patient size and/or use of iterative reconstruction technique. COMPARISON:  CT head 06/17/2021, CT cervical spine 05/12/2020 FINDINGS: CT HEAD FINDINGS Brain: Cerebral ventricle sizes are concordant with the degree of cerebral volume loss. Patchy and confluent areas of decreased attenuation are noted throughout the deep and periventricular white matter of the cerebral hemispheres bilaterally, compatible with chronic microvascular ischemic disease. No evidence of large-territorial acute  infarction. No parenchymal hemorrhage. No mass lesion. No extra-axial collection. No mass effect or midline shift. No hydrocephalus. Basilar cisterns are patent. Vascular: No hyperdense vessel. Atherosclerotic calcifications are present within the cavernous internal carotid and vertebral arteries. Skull: No acute fracture or focal lesion. Other: None. CT MAXILLOFACIAL FINDINGS Osseous: No fracture or mandibular dislocation. No destructive process. Sinuses/Orbits: Bilateral maxillary mucosal thickening. Otherwise paranasal sinuses and mastoid air cells are clear. Bilateral lens replacement. Otherwise the orbits are unremarkable. Soft tissues: Right maxillary subcutaneus soft tissue hematoma formation. CT CERVICAL SPINE FINDINGS Alignment: Straightening of the normal cervical lordosis likely due to positioning and degenerative changes. Stable grade 1 anterolisthesis of C7 on T1. Stable grade 1 anterolisthesis of C2 on C3. Skull base and vertebrae: Multilevel moderate degenerative changes of the spine. Associated at least moderate multilevel osseous neural foraminal stenosis. No severe osseous central canal stenosis. No acute fracture. No aggressive appearing focal osseous lesion or focal  pathologic process. Soft tissues and spinal canal: No prevertebral fluid or swelling. No visible canal hematoma. Upper chest: Unremarkable. Other: Atherosclerotic plaque of the carotid arteries within the neck. IMPRESSION: 1. No acute intracranial abnormality. 2. No acute displaced facial fracture. 3. No acute displaced fracture or traumatic listhesis of the cervical spine. 4. 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 Associated at least moderate multilevel osseous neural foraminal stenosis. Electronically Signed   By: Tish Frederickson M.D.   On: 04/18/2022 18:42   CT Cervical Spine Wo Contrast  Result Date: 04/18/2022 CLINICAL DATA:  Facial trauma, blunt; Head trauma, moderate-severe; Neck trauma (Age >= 65y) EXAM:  CT HEAD WITHOUT CONTRAST CT MAXILLOFACIAL WITHOUT CONTRAST CT CERVICAL SPINE WITHOUT CONTRAST TECHNIQUE: Multidetector CT imaging of the head, cervical spine, and maxillofacial structures were performed using the standard protocol without intravenous contrast. Multiplanar CT image reconstructions of the cervical spine and maxillofacial structures were also generated. RADIATION DOSE REDUCTION: This exam was performed according to the departmental dose-optimization program which includes automated exposure control, adjustment of the mA and/or kV according to patient size and/or use of iterative reconstruction technique. COMPARISON:  CT head 06/17/2021, CT cervical spine 05/12/2020 FINDINGS: CT HEAD FINDINGS Brain: Cerebral ventricle sizes are concordant with the degree of cerebral volume loss. Patchy and confluent areas of decreased attenuation are noted throughout the deep and periventricular white matter of the cerebral hemispheres bilaterally, compatible with chronic microvascular ischemic disease. No evidence of large-territorial acute infarction. No parenchymal hemorrhage. No mass lesion. No extra-axial collection. No mass effect or midline shift. No hydrocephalus. Basilar cisterns are patent. Vascular: No hyperdense vessel. Atherosclerotic calcifications are present within the cavernous internal carotid and vertebral arteries. Skull: No acute fracture or focal lesion. Other: None. CT MAXILLOFACIAL FINDINGS Osseous: No fracture or mandibular dislocation. No destructive process. Sinuses/Orbits: Bilateral maxillary mucosal thickening. Otherwise paranasal sinuses and mastoid air cells are clear. Bilateral lens replacement. Otherwise the orbits are unremarkable. Soft tissues: Right maxillary subcutaneus soft tissue hematoma formation. CT CERVICAL SPINE FINDINGS Alignment: Straightening of the normal cervical lordosis likely due to positioning and degenerative changes. Stable grade 1 anterolisthesis of C7 on T1.  Stable grade 1 anterolisthesis of C2 on C3. Skull base and vertebrae: Multilevel moderate degenerative changes of the spine. Associated at least moderate multilevel osseous neural foraminal stenosis. No severe osseous central canal stenosis. No acute fracture. No aggressive appearing focal osseous lesion or focal pathologic process. Soft tissues and spinal canal: No prevertebral fluid or swelling. No visible canal hematoma. Upper chest: Unremarkable. Other: Atherosclerotic plaque of the carotid arteries within the neck. IMPRESSION: 1. No acute intracranial abnormality. 2. No acute displaced facial fracture. 3. No acute displaced fracture or traumatic listhesis of the cervical spine. 4. 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 Associated at least moderate multilevel osseous neural foraminal stenosis. Electronically Signed   By: Tish Frederickson M.D.   On: 04/18/2022 18:42   DG Chest Portable 1 View  Result Date: 04/18/2022 CLINICAL DATA:  fall EXAM: PORTABLE CHEST 1 VIEW COMPARISON:  Chest x-ray 06/18/2021 FINDINGS: The heart and mediastinal contours are unchanged. Aortic calcification. No focal consolidation. No pulmonary edema. No pleural effusion. No pneumothorax. No acute osseous abnormality. IMPRESSION: 1. No active disease. 2.  Aortic Atherosclerosis (ICD10-I70.0). Electronically Signed   By: Tish Frederickson M.D.   On: 04/18/2022 17:21    Procedures Procedures    Medications Ordered in ED Medications - No data to display  ED Course/ Medical Decision Making/ A&P Clinical Course as of  04/18/22 2004  Thu Apr 18, 2022  1814 Valproic acid level(!) Valproic acid level low [JK]  1814 Basic metabolic panel(!) CBC metabolic panel without acute abnormalities [JK]  1909 CBC(!) CBC with slight decrease in hemoglobin to 12.5, similar to previous values [JK]  1910 Head and facial CT without acute findings [JK]  1938 D/w Dr Margo Aye [JK]    Clinical Course User Index [JK] Linwood Dibbles, MD                            Medical Decision Making Problems Addressed: Contusion of face, initial encounter: acute illness or injury that poses a threat to life or bodily functions Syncope, unspecified syncope type: acute illness or injury that poses a threat to life or bodily functions  Amount and/or Complexity of Data Reviewed Labs: ordered. Decision-making details documented in ED Course. Radiology: ordered and independent interpretation performed.  Risk Decision regarding hospitalization.   Patient presented to the ED for evaluation of an unwitnessed fall at the nursing facility.  Patient had obvious facial contusion.  Concerned about the possibility of facial bone fracture, cervical spine fracture, cerebral hemorrhage.  Fortunately no acute findings noted on the CT scan.  Etiology of his fall unclear if this was mechanical versus syncopal.  Patient told family that he thinks he passed out.  With his comorbidities and prior stroke I do think it is reasonable to bring him to the hospital for further evaluation including cardiac monitoring, possible recurrent seizure stroke workup       Final Clinical Impression(s) / ED Diagnoses Final diagnoses:  Syncope, unspecified syncope type  Contusion of face, initial encounter    Rx / DC Orders ED Discharge Orders     None         Linwood Dibbles, MD 04/18/22 2004

## 2022-04-19 ENCOUNTER — Observation Stay (HOSPITAL_COMMUNITY): Payer: Medicare Other

## 2022-04-19 DIAGNOSIS — R531 Weakness: Secondary | ICD-10-CM | POA: Diagnosis not present

## 2022-04-19 DIAGNOSIS — Z8673 Personal history of transient ischemic attack (TIA), and cerebral infarction without residual deficits: Secondary | ICD-10-CM | POA: Diagnosis not present

## 2022-04-19 DIAGNOSIS — F319 Bipolar disorder, unspecified: Secondary | ICD-10-CM | POA: Diagnosis present

## 2022-04-19 DIAGNOSIS — I951 Orthostatic hypotension: Secondary | ICD-10-CM | POA: Diagnosis present

## 2022-04-19 DIAGNOSIS — F02811 Dementia in other diseases classified elsewhere, unspecified severity, with agitation: Secondary | ICD-10-CM | POA: Diagnosis present

## 2022-04-19 DIAGNOSIS — R55 Syncope and collapse: Secondary | ICD-10-CM | POA: Diagnosis present

## 2022-04-19 DIAGNOSIS — Z7189 Other specified counseling: Secondary | ICD-10-CM | POA: Diagnosis not present

## 2022-04-19 DIAGNOSIS — Y92129 Unspecified place in nursing home as the place of occurrence of the external cause: Secondary | ICD-10-CM | POA: Diagnosis not present

## 2022-04-19 DIAGNOSIS — Z79890 Hormone replacement therapy (postmenopausal): Secondary | ICD-10-CM | POA: Diagnosis not present

## 2022-04-19 DIAGNOSIS — Z87891 Personal history of nicotine dependence: Secondary | ICD-10-CM | POA: Diagnosis not present

## 2022-04-19 DIAGNOSIS — S0083XA Contusion of other part of head, initial encounter: Secondary | ICD-10-CM | POA: Diagnosis present

## 2022-04-19 DIAGNOSIS — R1310 Dysphagia, unspecified: Secondary | ICD-10-CM | POA: Diagnosis present

## 2022-04-19 DIAGNOSIS — Z8249 Family history of ischemic heart disease and other diseases of the circulatory system: Secondary | ICD-10-CM | POA: Diagnosis not present

## 2022-04-19 DIAGNOSIS — W19XXXA Unspecified fall, initial encounter: Secondary | ICD-10-CM | POA: Diagnosis present

## 2022-04-19 DIAGNOSIS — Z515 Encounter for palliative care: Secondary | ICD-10-CM | POA: Diagnosis not present

## 2022-04-19 DIAGNOSIS — R471 Dysarthria and anarthria: Secondary | ICD-10-CM | POA: Diagnosis present

## 2022-04-19 DIAGNOSIS — G40909 Epilepsy, unspecified, not intractable, without status epilepticus: Secondary | ICD-10-CM | POA: Diagnosis present

## 2022-04-19 DIAGNOSIS — Z66 Do not resuscitate: Secondary | ICD-10-CM | POA: Diagnosis present

## 2022-04-19 DIAGNOSIS — Z781 Physical restraint status: Secondary | ICD-10-CM | POA: Diagnosis not present

## 2022-04-19 DIAGNOSIS — Z1152 Encounter for screening for COVID-19: Secondary | ICD-10-CM | POA: Diagnosis not present

## 2022-04-19 DIAGNOSIS — F1097 Alcohol use, unspecified with alcohol-induced persisting dementia: Secondary | ICD-10-CM | POA: Diagnosis present

## 2022-04-19 DIAGNOSIS — Z79899 Other long term (current) drug therapy: Secondary | ICD-10-CM | POA: Diagnosis not present

## 2022-04-19 DIAGNOSIS — G20A1 Parkinson's disease without dyskinesia, without mention of fluctuations: Secondary | ICD-10-CM | POA: Diagnosis present

## 2022-04-19 DIAGNOSIS — R400 Somnolence: Secondary | ICD-10-CM | POA: Diagnosis present

## 2022-04-19 DIAGNOSIS — F03918 Unspecified dementia, unspecified severity, with other behavioral disturbance: Secondary | ICD-10-CM

## 2022-04-19 DIAGNOSIS — F0283 Dementia in other diseases classified elsewhere, unspecified severity, with mood disturbance: Secondary | ICD-10-CM | POA: Diagnosis present

## 2022-04-19 DIAGNOSIS — E039 Hypothyroidism, unspecified: Secondary | ICD-10-CM | POA: Diagnosis present

## 2022-04-19 DIAGNOSIS — G4700 Insomnia, unspecified: Secondary | ICD-10-CM | POA: Diagnosis present

## 2022-04-19 DIAGNOSIS — E512 Wernicke's encephalopathy: Secondary | ICD-10-CM | POA: Diagnosis present

## 2022-04-19 LAB — MAGNESIUM: Magnesium: 2.2 mg/dL (ref 1.7–2.4)

## 2022-04-19 LAB — COMPREHENSIVE METABOLIC PANEL
ALT: 22 U/L (ref 0–44)
AST: 17 U/L (ref 15–41)
Albumin: 3.7 g/dL (ref 3.5–5.0)
Alkaline Phosphatase: 48 U/L (ref 38–126)
Anion gap: 6 (ref 5–15)
BUN: 16 mg/dL (ref 8–23)
CO2: 29 mmol/L (ref 22–32)
Calcium: 9.7 mg/dL (ref 8.9–10.3)
Chloride: 108 mmol/L (ref 98–111)
Creatinine, Ser: 0.52 mg/dL — ABNORMAL LOW (ref 0.61–1.24)
GFR, Estimated: >60 mL/min (ref >60)
Glucose, Bld: 86 mg/dL (ref 70–99)
Potassium: 3.8 mmol/L (ref 3.5–5.1)
Sodium: 143 mmol/L (ref 135–145)
Total Bilirubin: 0.7 mg/dL (ref 0.3–1.2)
Total Protein: 6.3 g/dL — ABNORMAL LOW (ref 6.5–8.1)

## 2022-04-19 LAB — ECHOCARDIOGRAM COMPLETE
Area-P 1/2: 3.89 cm2
Calc EF: 62.1 %
S' Lateral: 2.6 cm
Single Plane A2C EF: 62.1 %
Single Plane A4C EF: 64 %

## 2022-04-19 LAB — CBC
HCT: 36 % — ABNORMAL LOW (ref 39.0–52.0)
Hemoglobin: 12.1 g/dL — ABNORMAL LOW (ref 13.0–17.0)
MCH: 33.2 pg (ref 26.0–34.0)
MCHC: 33.6 g/dL (ref 30.0–36.0)
MCV: 98.9 fL (ref 80.0–100.0)
Platelets: 216 10*3/uL (ref 150–400)
RBC: 3.64 MIL/uL — ABNORMAL LOW (ref 4.22–5.81)
RDW: 12.4 % (ref 11.5–15.5)
WBC: 8.2 10*3/uL (ref 4.0–10.5)
nRBC: 0 % (ref 0.0–0.2)

## 2022-04-19 LAB — URINALYSIS, ROUTINE W REFLEX MICROSCOPIC
Bilirubin Urine: NEGATIVE
Glucose, UA: NEGATIVE mg/dL
Hgb urine dipstick: NEGATIVE
Ketones, ur: 5 mg/dL — AB
Leukocytes,Ua: NEGATIVE
Nitrite: NEGATIVE
Protein, ur: NEGATIVE mg/dL
Specific Gravity, Urine: 1.012 (ref 1.005–1.030)
pH: 7 (ref 5.0–8.0)

## 2022-04-19 LAB — RESP PANEL BY RT-PCR (RSV, FLU A&B, COVID)  RVPGX2
Influenza A by PCR: NEGATIVE
Influenza B by PCR: NEGATIVE
Resp Syncytial Virus by PCR: NEGATIVE
SARS Coronavirus 2 by RT PCR: NEGATIVE

## 2022-04-19 LAB — PHOSPHORUS: Phosphorus: 3.2 mg/dL (ref 2.5–4.6)

## 2022-04-19 LAB — MRSA NEXT GEN BY PCR, NASAL: MRSA by PCR Next Gen: NOT DETECTED

## 2022-04-19 MED ORDER — DIVALPROEX SODIUM 250 MG PO DR TAB
250.0000 mg | DELAYED_RELEASE_TABLET | Freq: Every day | ORAL | Status: DC
  Administered 2022-04-19 – 2022-04-21 (×3): 250 mg via ORAL
  Filled 2022-04-19 (×3): qty 1

## 2022-04-19 MED ORDER — LORAZEPAM 2 MG/ML IJ SOLN
0.5000 mg | Freq: Once | INTRAMUSCULAR | Status: AC
  Administered 2022-04-19: 0.5 mg via INTRAVENOUS
  Filled 2022-04-19: qty 1

## 2022-04-19 MED ORDER — VALPROATE SODIUM 100 MG/ML IV SOLN
500.0000 mg | Freq: Two times a day (BID) | INTRAVENOUS | Status: DC
  Administered 2022-04-19 (×2): 500 mg via INTRAVENOUS
  Filled 2022-04-19 (×3): qty 5

## 2022-04-19 MED ORDER — DEXTROSE IN LACTATED RINGERS 5 % IV SOLN: INTRAVENOUS | Status: AC

## 2022-04-19 MED ORDER — OLANZAPINE 5 MG PO TABS
7.5000 mg | ORAL_TABLET | Freq: Two times a day (BID) | ORAL | Status: DC
  Administered 2022-04-19 – 2022-05-07 (×34): 7.5 mg via ORAL
  Filled 2022-04-19 (×34): qty 2

## 2022-04-19 MED ORDER — LEVOTHYROXINE SODIUM 100 MCG/5ML IV SOLN: 56.0000 ug | Freq: Every day | INTRAVENOUS | Status: DC

## 2022-04-19 MED ORDER — CARBIDOPA-LEVODOPA 25-100 MG PO TABS
0.5000 | ORAL_TABLET | Freq: Three times a day (TID) | ORAL | Status: DC
  Administered 2022-04-19 – 2022-05-07 (×52): 0.5 via ORAL
  Filled 2022-04-19 (×53): qty 1

## 2022-04-19 MED ORDER — ENOXAPARIN SODIUM 40 MG/0.4ML IJ SOSY
40.0000 mg | PREFILLED_SYRINGE | Freq: Every day | INTRAMUSCULAR | Status: DC
  Administered 2022-04-19 – 2022-05-07 (×19): 40 mg via SUBCUTANEOUS
  Filled 2022-04-19 (×19): qty 0.4

## 2022-04-19 MED ORDER — LEVOTHYROXINE SODIUM 50 MCG PO TABS: 75.0000 ug | ORAL_TABLET | Freq: Every day | ORAL | Status: DC

## 2022-04-19 MED ORDER — MEMANTINE HCL 10 MG PO TABS
5.0000 mg | ORAL_TABLET | Freq: Every day | ORAL | Status: DC
  Administered 2022-04-19 – 2022-04-21 (×3): 5 mg via ORAL
  Filled 2022-04-19 (×3): qty 1

## 2022-04-19 MED ORDER — PROPRANOLOL HCL 10 MG PO TABS
10.0000 mg | ORAL_TABLET | Freq: Two times a day (BID) | ORAL | Status: DC
  Administered 2022-04-19 – 2022-04-20 (×3): 10 mg via ORAL
  Filled 2022-04-19 (×4): qty 1

## 2022-04-19 MED ORDER — DIVALPROEX SODIUM 250 MG PO DR TAB
500.0000 mg | DELAYED_RELEASE_TABLET | Freq: Two times a day (BID) | ORAL | Status: DC
  Administered 2022-04-19 – 2022-04-22 (×6): 500 mg via ORAL
  Filled 2022-04-19 (×6): qty 2

## 2022-04-19 NOTE — Progress Notes (Signed)
  Echocardiogram 2D Echocardiogram has been performed.  Trevor Cook 04/19/2022, 2:13 PM

## 2022-04-19 NOTE — Evaluation (Signed)
Clinical/Bedside Swallow Evaluation Patient Details  Name: Trevor Cook MRN: [id number] Date of Birth: 10/27/1945  Today's Date: 04/19/2022 Time: SLP Start Time (ACUTE ONLY): 1542 SLP Stop Time (ACUTE ONLY): 1615 SLP Time Calculation (min) (ACUTE ONLY): 33 min  Past Medical History:  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HCC)    Bipolar 1 disorder (HCC)    Cerebellar disorder    Epilepsy (HCC)    Hypothyroid    Insomnia    Other postablative hypothyroidism    Stutter    Tremor    Unspecified condition of brain    Wernicke's encephalopathy    Past Surgical History:  Past Surgical History:  Procedure Laterality Date   NO PAST SURGERIES     HPI:  Trevor Cook is a 76 y.o. male with medical history significant for dementia, bipolar 1 disorder, epilepsy, CVA, hypothyroidism, who presented to [facility] ED from SNF after passing out. pt has h/o dementia.  He was found on the ground at [facility].  His fall was unwitnessed.  EMS was activated, 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HCC) Bipolar 1 disorder Cerebellar disorder Epilepsy (HCC) Hypothyroid Insomnia Other postablative hypothyroidism Stutter Tremor Unspecified condition of brain Wernicke's encephalopathy carbidopa levadopa .  Pt has h/o Multilevel moderate degenerative changes of the spine. Pt has been previously seen by SLP during prior hospital admission and progressed to tolerating dys3/thin diet with self feeding.  No family present at this time.    Assessment / Plan / Recommendation  Clinical Impression  Patient presents with clinical indications concerning for oropharyngeal dysphagia.  He is sleepy and did not follow directions.  Able to seal lips on spoon and straw.  Pooled secretions noted without pt swallowing despite verbal cue.  Provided pt with po including [place] ice, cranberry juice, graham cracker bolus, applesauce - Initially pt tolerating po without coughing - but multiple swallows and  occasional delay in swallow reflex.   However later in session (after consuming nearly 4 ounces juice, 7 bites of [place] ice, 2 bites of applesauce and single bite of solid cracker.     Congested coughing ongoing without ability to clear.   Oral suction provided to clear secretions- - At this time, rec npo x essentialy medications. Will plan MBS tomorrow am given his h/o neuro diseases and dysphagia.  RN informed of plan/recommendations. SLP set up oral suction. SLP Visit Diagnosis: Dysphagia, oropharyngeal phase (R13.12)    Aspiration Risk  Severe aspiration risk;Risk for inadequate nutrition/hydration    Diet Recommendation NPO;NPO except meds   Liquid Administration via: Cup;Straw Medication Administration: Other (Comment) (crushed with nectar) Compensations: Slow rate;Small sips/bites    Other  Recommendations Oral Care Recommendations: Oral care QID Other Recommendations: Have oral suction available    Recommendations for follow up therapy are one component of a multi-disciplinary discharge planning process, led by the attending physician.  Recommendations may be updated based on patient status, additional functional criteria and insurance authorization.  Follow up Recommendations Follow physician's recommendations for discharge plan and follow up therapies      Assistance Recommended at Discharge  Full assist  Functional Status Assessment Patient has had a recent decline in their functional status and demonstrates the ability to make significant improvements in function in a reasonable and predictable amount of time.  Frequency and Duration min 1 x/week  2 weeks       Prognosis Prognosis for Safe Diet Advancement: Fair      Swallow Study   General Date of Onset:  04/19/22 HPI: Trevor Cook is a 76 y.o. male with medical history significant for dementia, bipolar 1 disorder, epilepsy, CVA, hypothyroidism, who presented to [facility] ED from SNF after passing out. pt has h/o dementia.   He was found on the ground at [facility].  His fall was unwitnessed.  EMS was activated, 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HCC) Bipolar 1 disorder Cerebellar disorder Epilepsy (HCC) Hypothyroid Insomnia Other postablative hypothyroidism Stutter Tremor Unspecified condition of brain Wernicke's encephalopathy carbidopa levadopa .  Pt has h/o Multilevel moderate degenerative changes of the spine. Pt has been previously seen by SLP during prior hospital admission and progressed to tolerating dys3/thin diet with self feeding.  No family present at this time. Type of Study: Bedside Swallow Evaluation Diet Prior to this Study: NPO Temperature Spikes Noted: No Respiratory Status: Nasal cannula History of Recent Intubation: No Behavior/Cognition: Alert;Cooperative;Pleasant mood Oral Cavity Assessment: Within Functional Limits Oral Care Completed by SLP: Yes Oral Cavity - Dentition: Adequate natural dentition Vision: Impaired for self-feeding Self-Feeding Abilities: Total assist Patient Positioning: Upright in bed Baseline Vocal Quality: Low vocal intensity Volitional Cough: Congested;Cognitively unable to elicit    Oral/Motor/Sensory Function Overall Oral Motor/Sensory Function: Other (comment) (pt did not follow directions, has right facial asymmetry, sealed lips on straw)   Ice Chips Ice chips: Impaired Presentation: Spoon Oral Phase Impairments: Reduced labial seal;Reduced lingual movement/coordination;Poor awareness of bolus Oral Phase Functional Implications: Prolonged oral transit Pharyngeal Phase Impairments: Suspected delayed Swallow   Thin Liquid Thin Liquid: Impaired Presentation: Cup;Self Fed;Spoon;Straw Pharyngeal  Phase Impairments: Suspected delayed Swallow;Multiple swallows;Cough - Delayed    Nectar Thick Nectar Thick Liquid: Impaired Oral Phase Impairments: Reduced labial seal Pharyngeal Phase Impairments: Cough - Delayed;Suspected delayed Swallow   Honey Thick Honey Thick Liquid:  Not tested   Puree Puree: Impaired Presentation: Spoon Oral Phase Impairments: Reduced labial seal;Reduced lingual movement/coordination Oral Phase Functional Implications: Oral holding Pharyngeal Phase Impairments: Suspected delayed Swallow;Multiple swallows;Cough - Delayed   Solid     Solid: Impaired Oral Phase Impairments: Impaired mastication;Reduced labial seal;Reduced lingual movement/coordination Oral Phase Functional Implications: Impaired mastication Pharyngeal Phase Impairments: Suspected delayed Swallow;Cough - Immediate Other Comments: cough prior to swallow initiation -      Chales Abrahams 04/19/2022,4:31 PM   Rolena Infante, MS [facility] SLP Acute Rehab Services Office [phone] Pager [phone]

## 2022-04-19 NOTE — Plan of Care (Signed)
  Problem: Clinical Measurements: Goal: Ability to maintain clinical measurements within normal limits will improve Outcome: Progressing Goal: Diagnostic test results will improve Outcome: Progressing   Problem: Safety: Goal: Ability to remain free from injury will improve Outcome: Progressing   

## 2022-04-19 NOTE — ED Notes (Signed)
Patient very confuse and agitated. Patient tried to get out in the bed. Posey belt applied. Will continue to monitor.

## 2022-04-19 NOTE — Progress Notes (Signed)
Briefly, patient is a 76 year old man with dementia, bipolar disorder and seizure disorder who was admitted earlier this morning for either unwitnessed fall or syncope, it is not clear.  He was found on the ground at the SNF.  In the ED patient was noted to be agitated with no acute changes on head CT and cervical spine.  Patient apparently was admitted for syncope workup.  Today, I find the patient to be quite confused and agitated.  He has physical restraints in place.  Patient looks at me [facility] when I speak with him but does not have any meaningful response.  He seems to just groan as his only response.   Patient's baseline is unclear, will call SNF to find out what his baseline is.  I am not at all sure that a syncope workup is warranted given extremely diminished functional status.  Patient is awaiting swallow evaluation prior to his home medications especially for dementia and bipolar disorder being started however patient does not seem to me to have adequate mental status to follow instructions.   Patient was accompanied by his daughter-in-law yesterday.  Patient is DNR.

## 2022-04-19 NOTE — Progress Notes (Signed)
   04/19/22 1710  What Happened  Was fall witnessed? No  Patients activity before fall to/from bed, chair, or stretcher  Was patient injured? No  Patient found on floor  Found by Staff-comment (staff RN)  Stated prior activity to/from bed, chair, or stretcher  Provider Notification  Provider Name/Title Luberta Robertson, MD  Date Provider Notified 04/19/22  Time Provider Notified 1705  Method of Notification  (secure chat)  Notification Reason Fall  Provider response See new orders  Date of Provider Response 04/19/22  Time of Provider Response 1730  Adult Fall Risk Assessment  Risk Factor Category (scoring not indicated) High fall risk per protocol (document High fall risk)  Patient Fall Risk Level High fall risk  Adult Fall Risk Interventions  Required Bundle Interventions *See Row Information* High fall risk - low, moderate, and high requirements implemented  Additional Interventions Use of appropriate toileting equipment (bedpan, BSC, etc.);Room near nurses station  Screening for Fall Injury Risk (To be completed on HIGH fall risk patients) - Assessing Need for Floor Mats  Risk For Fall Injury- Criteria for Floor Mats Admitted as a result of a fall;Confusion/dementia (+NuDESC, CIWA, TBI, etc.)  Will Implement Floor Mats Yes  Vitals  Temp 97.6 F (36.4 C)  Temp Source Oral  BP (!) 140/92  MAP (mmHg) 106  BP Location Right Arm  BP Method Automatic  Patient Position (if appropriate) Sitting  Pulse Rate 72  Pulse Rate Source Monitor  Resp 18  Oxygen Therapy  SpO2 90 %  O2 Device Room Air  Pain Assessment  Pain Scale PAINAD  PAINAD (Pain Assessment in Advanced Dementia)  Breathing 1  Negative Vocalization 0  Facial Expression 0  Body Language 0  Consolability 0  PAINAD Score 1  PCA/Epidural/Spinal Assessment  Respiratory Pattern Regular  Neurological  Neuro (WDL) X  Level of Consciousness Alert  Orientation Level Disoriented X4  Cognition Memory impairment  Neuro  Symptoms Tremors  Musculoskeletal  Musculoskeletal (WDL) X  Assistive Device None  Generalized Weakness Yes  Weight Bearing Restrictions No  Musculoskeletal Details  RLE Weakness  LLE Weakness  Integumentary  Integumentary (WDL) X  Skin Color Appropriate for ethnicity  Skin Condition Dry  Skin Integrity Abrasion;Ecchymosis;Erythema/redness  Abrasion Location Elbow;Hand;Buttocks;Face;Leg  Abrasion Location Orientation Bilateral  Abrasion Intervention Other (Comment) (assessed)  Ecchymosis Location Face  Ecchymosis Location Orientation Right  Erythema/Redness Location Sacrum  Erythema/Redness Location Orientation Bilateral  Skin Turgor Non-tenting   Pt was found by staff RN kneeling on ground. This RN along with staff RN helped pt back to bed. Pt had removed IV and male purewick. Blood and urine all over pt and bed. Pt cleaned, bed cleaned, pt assisted back to bed. Pt did not c/o pain, only stated he was thirsty. Sitter orders placed and sitter to pt room. Restraint orders placed by MD as well d/t pt agitation and impulsiveness.

## 2022-04-19 NOTE — H&P (Signed)
History and Physical  Trevor GuernseyJohn Cook WUJ:[id number] DOB: [birth year] DOA: 04/18/2022  Referring physician: Dr. Lynelle DoctorKnapp, EDP PCP: Aida PufferLittle, James, MD  Outpatient Specialists: Neurology, behavioral health. Patient coming from: SNF  Chief Complaint: Syncope  HPI: Trevor Cook is a 76 y.o. male with medical history significant for dementia, bipolar 1 disorder, epilepsy, CVA, hypothyroidism, who [facility] after passing out.  The patient is a poor historian in the setting of dementia.  States he passed out.  [facility]  His fall was unwitnessed.  EMS was activated, he was brought to the ED for further evaluation.  In the ED he is alert and agitated.  CT head and cervical spine nonacute.  UA, COVID-19 screening test ordered and pending.  TRH, hospitalist service, was asked to admit for syncope workup.  ED Course: Tmax 98.7.  BP 145/101, pulse 98, respiratory 16, O2 saturation 95% on room air.  Lab studies remarkable for serum creatinine 0.54.  Review of Systems: Review of systems as noted in the HPI. All other systems reviewed and are negative.   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HCC)    Bipolar 1 disorder (HCC)    Cerebellar disorder    Epilepsy (HCC)    Hypothyroid    Insomnia    Other postablative hypothyroidism    Stutter    Tremor    Unspecified condition of brain    Wernicke's encephalopathy    Past Surgical History:  Procedure Laterality Date   NO PAST SURGERIES      Social History:  reports that he has quit smoking. He has never used smokeless tobacco. He reports that he does not currently use alcohol. He reports that he does not use drugs.   No Known Allergies  Family History  Problem Relation Age of Onset   Congestive Heart Failure Mother    Cancer Father       Prior to Admission medications   Medication Sig Start Date End Date Taking? Authorizing Provider  acetaminophen  (TYLENOL) 325 MG tablet Take 2 tablets (650 mg total) by mouth every 6 (six) hours as needed for mild pain (or Fever >/= 101). 07/04/21   Rhetta MuraSamtani, Jai-Gurmukh, MD  amLODipine (NORVASC) 5 MG tablet Take 1 tablet (5 mg total) by mouth daily. 07/04/21   Rhetta MuraSamtani, Jai-Gurmukh, MD  carbidopa-levodopa (SINEMET IR) 25-100 MG tablet Take 0.5 tablets by mouth 3 (three) times daily. 07/10/21   Rhetta MuraSamtani, Jai-Gurmukh, MD  divalproex (DEPAKOTE) 250 MG DR tablet Take 1 tablet (250 mg total) by mouth at bedtime. 07/10/21   Rhetta MuraSamtani, Jai-Gurmukh, MD  divalproex (DEPAKOTE) 500 MG DR tablet Take 1 tablet (500 mg total) by mouth 2 (two) times daily. 07/10/21   Rhetta MuraSamtani, Jai-Gurmukh, MD  lactobacillus acidophilus (BACID) TABS tablet Take 2 tablets by mouth 3 (three) times daily.    [provider]  levothyroxine (SYNTHROID) 75 MCG tablet Take 75 mcg by mouth daily before breakfast. 06/15/21   [provider]  memantine (NAMENDA) 5 MG tablet Take 1 tablet (5 mg total) by mouth at bedtime. 07/10/21   Rhetta MuraSamtani, Jai-Gurmukh, MD  Multiple Vitamin (MULTIVITAMIN WITH MINERALS) TABS tablet Take 1 tablet by mouth daily.    [provider]  OLANZapine (ZYPREXA) 5 MG tablet Take 1 tablet (5 mg total) by mouth 3 (three) times daily for 18 doses. 07/10/21 07/16/21  Rhetta MuraSamtani, Jai-Gurmukh, MD  propranolol (INDERAL) 10 MG tablet Take 1 tablet (10 mg total)  by mouth 2 (two) times daily. 07/10/21   Rhetta Mura, MD  thiamine 100 MG tablet Take 1 tablet (100 mg total) by mouth daily. 05/17/20   Joseph Art, DO    Physical Exam: BP (!) 145/101   Pulse 98   Temp 98.7 F (37.1 C) (Oral)   Resp 16   SpO2 95%   General: 76 y.o. year-old male well developed well nourished in no acute distress.  Alert and confused in the setting of dementia. Cardiovascular: Regular rate and rhythm with no rubs or gallops.  No thyromegaly or JVD noted.  No lower extremity edema. 2/4 pulses in all 4 extremities. Respiratory: Clear to  auscultation with no wheezes or rales.  Poor inspiratory effort. Abdomen: Soft nontender nondistended with normal bowel sounds x4 quadrants. Muskuloskeletal: No cyanosis, clubbing or edema noted bilaterally Neuro: CN II-XII intact, strength, sensation, reflexes Skin: No ulcerative lesions noted or rashes Psychiatry: Judgement and insight appear abnormal. Mood is agitated.          Labs on Admission:  Basic Metabolic Panel: Recent Labs  Lab 04/18/22 1629  NA 139  K 4.0  CL 103  CO2 30  GLUCOSE 98  BUN 21  CREATININE 0.54*  CALCIUM 9.3   Liver Function Tests: No results for input(s): "AST", "ALT", "ALKPHOS", "BILITOT", "PROT", "ALBUMIN" in the last 168 hours. No results for input(s): "LIPASE", "AMYLASE" in the last 168 hours. No results for input(s): "AMMONIA" in the last 168 hours. CBC: Recent Labs  Lab 04/18/22 1629  WBC 7.7  HGB 12.5*  HCT 37.0*  MCV 98.9  PLT 224   Cardiac Enzymes: No results for input(s): "CKTOTAL", "CKMB", "CKMBINDEX", "TROPONINI" in the last 168 hours.  BNP (last 3 results) No results for input(s): "BNP" in the last 8760 hours.  ProBNP (last 3 results) No results for input(s): "PROBNP" in the last 8760 hours.  CBG: No results for input(s): "GLUCAP" in the last 168 hours.  Radiological Exams on Admission: CT Maxillofacial Wo Contrast  Result Date: 04/18/2022 CLINICAL DATA:  Facial trauma, blunt; Head trauma, moderate-severe; Neck trauma (Age >= 65y) EXAM: CT HEAD WITHOUT CONTRAST CT MAXILLOFACIAL WITHOUT CONTRAST CT CERVICAL SPINE WITHOUT CONTRAST TECHNIQUE: Multidetector CT imaging of the head, cervical spine, and maxillofacial structures were performed using the standard protocol without intravenous contrast. Multiplanar CT image reconstructions of the cervical spine and maxillofacial structures were also generated. RADIATION DOSE REDUCTION: This exam was performed according to the departmental dose-optimization program which includes  automated exposure control, adjustment of the mA and/or kV according to patient size and/or use of iterative reconstruction technique. COMPARISON:  CT head 06/17/2021, CT cervical spine 05/12/2020 FINDINGS: CT HEAD FINDINGS Brain: Cerebral ventricle sizes are concordant with the degree of cerebral volume loss. Patchy and confluent areas of decreased attenuation are noted throughout the deep and periventricular white matter of the cerebral hemispheres bilaterally, compatible with chronic microvascular ischemic disease. No evidence of large-territorial acute infarction. No parenchymal hemorrhage. No mass lesion. No extra-axial collection. No mass effect or midline shift. No hydrocephalus. Basilar cisterns are patent. Vascular: No hyperdense vessel. Atherosclerotic calcifications are present within the cavernous internal carotid and vertebral arteries. Skull: No acute fracture or focal lesion. Other: None. CT MAXILLOFACIAL FINDINGS Osseous: No fracture or mandibular dislocation. No destructive process. Sinuses/Orbits: Bilateral maxillary mucosal thickening. Otherwise paranasal sinuses and mastoid air cells are clear. Bilateral lens replacement. Otherwise the orbits are unremarkable. Soft tissues: Right maxillary subcutaneus soft tissue hematoma formation. CT CERVICAL SPINE FINDINGS Alignment: Straightening  of the normal cervical lordosis likely due to positioning and degenerative changes. Stable grade 1 anterolisthesis of C7 on T1. Stable grade 1 anterolisthesis of C2 on C3. Skull base and vertebrae: Multilevel moderate degenerative changes of the spine. Associated at least moderate multilevel osseous neural foraminal stenosis. No severe osseous central canal stenosis. No acute fracture. No aggressive appearing focal osseous lesion or focal pathologic process. Soft tissues and spinal canal: No prevertebral fluid or swelling. No visible canal hematoma. Upper chest: Unremarkable. Other: Atherosclerotic plaque of the  carotid arteries within the neck. IMPRESSION: 1. No acute intracranial abnormality. 2. No acute displaced facial fracture. 3. No acute displaced fracture or traumatic listhesis of the cervical spine. 4. 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 Associated at least moderate multilevel osseous neural foraminal stenosis. Electronically Signed   By: Tish Frederickson M.D.   On: 04/18/2022 18:42   CT Head Wo Contrast  Result Date: 04/18/2022 CLINICAL DATA:  Facial trauma, blunt; Head trauma, moderate-severe; Neck trauma (Age >= 65y) EXAM: CT HEAD WITHOUT CONTRAST CT MAXILLOFACIAL WITHOUT CONTRAST CT CERVICAL SPINE WITHOUT CONTRAST TECHNIQUE: Multidetector CT imaging of the head, cervical spine, and maxillofacial structures were performed using the standard protocol without intravenous contrast. Multiplanar CT image reconstructions of the cervical spine and maxillofacial structures were also generated. RADIATION DOSE REDUCTION: This exam was performed according to the departmental dose-optimization program which includes automated exposure control, adjustment of the mA and/or kV according to patient size and/or use of iterative reconstruction technique. COMPARISON:  CT head 06/17/2021, CT cervical spine 05/12/2020 FINDINGS: CT HEAD FINDINGS Brain: Cerebral ventricle sizes are concordant with the degree of cerebral volume loss. Patchy and confluent areas of decreased attenuation are noted throughout the deep and periventricular white matter of the cerebral hemispheres bilaterally, compatible with chronic microvascular ischemic disease. No evidence of large-territorial acute infarction. No parenchymal hemorrhage. No mass lesion. No extra-axial collection. No mass effect or midline shift. No hydrocephalus. Basilar cisterns are patent. Vascular: No hyperdense vessel. Atherosclerotic calcifications are present within the cavernous internal carotid and vertebral arteries. Skull: No acute fracture or focal  lesion. Other: None. CT MAXILLOFACIAL FINDINGS Osseous: No fracture or mandibular dislocation. No destructive process. Sinuses/Orbits: Bilateral maxillary mucosal thickening. Otherwise paranasal sinuses and mastoid air cells are clear. Bilateral lens replacement. Otherwise the orbits are unremarkable. Soft tissues: Right maxillary subcutaneus soft tissue hematoma formation. CT CERVICAL SPINE FINDINGS Alignment: Straightening of the normal cervical lordosis likely due to positioning and degenerative changes. Stable grade 1 anterolisthesis of C7 on T1. Stable grade 1 anterolisthesis of C2 on C3. Skull base and vertebrae: Multilevel moderate degenerative changes of the spine. Associated at least moderate multilevel osseous neural foraminal stenosis. No severe osseous central canal stenosis. No acute fracture. No aggressive appearing focal osseous lesion or focal pathologic process. Soft tissues and spinal canal: No prevertebral fluid or swelling. No visible canal hematoma. Upper chest: Unremarkable. Other: Atherosclerotic plaque of the carotid arteries within the neck. IMPRESSION: 1. No acute intracranial abnormality. 2. No acute displaced facial fracture. 3. No acute displaced fracture or traumatic listhesis of the cervical spine. 4. 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 Associated at least moderate multilevel osseous neural foraminal stenosis. Electronically Signed   By: Tish Frederickson M.D.   On: 04/18/2022 18:42   CT Cervical Spine Wo Contrast  Result Date: 04/18/2022 CLINICAL DATA:  Facial trauma, blunt; Head trauma, moderate-severe; Neck trauma (Age >= 65y) EXAM: CT HEAD WITHOUT CONTRAST CT MAXILLOFACIAL WITHOUT CONTRAST CT CERVICAL SPINE WITHOUT CONTRAST  TECHNIQUE: Multidetector CT imaging of the head, cervical spine, and maxillofacial structures were performed using the standard protocol without intravenous contrast. Multiplanar CT image reconstructions of the cervical spine and  maxillofacial structures were also generated. RADIATION DOSE REDUCTION: This exam was performed according to the departmental dose-optimization program which includes automated exposure control, adjustment of the mA and/or kV according to patient size and/or use of iterative reconstruction technique. COMPARISON:  CT head 06/17/2021, CT cervical spine 05/12/2020 FINDINGS: CT HEAD FINDINGS Brain: Cerebral ventricle sizes are concordant with the degree of cerebral volume loss. Patchy and confluent areas of decreased attenuation are noted throughout the deep and periventricular white matter of the cerebral hemispheres bilaterally, compatible with chronic microvascular ischemic disease. No evidence of large-territorial acute infarction. No parenchymal hemorrhage. No mass lesion. No extra-axial collection. No mass effect or midline shift. No hydrocephalus. Basilar cisterns are patent. Vascular: No hyperdense vessel. Atherosclerotic calcifications are present within the cavernous internal carotid and vertebral arteries. Skull: No acute fracture or focal lesion. Other: None. CT MAXILLOFACIAL FINDINGS Osseous: No fracture or mandibular dislocation. No destructive process. Sinuses/Orbits: Bilateral maxillary mucosal thickening. Otherwise paranasal sinuses and mastoid air cells are clear. Bilateral lens replacement. Otherwise the orbits are unremarkable. Soft tissues: Right maxillary subcutaneus soft tissue hematoma formation. CT CERVICAL SPINE FINDINGS Alignment: Straightening of the normal cervical lordosis likely due to positioning and degenerative changes. Stable grade 1 anterolisthesis of C7 on T1. Stable grade 1 anterolisthesis of C2 on C3. Skull base and vertebrae: Multilevel moderate degenerative changes of the spine. Associated at least moderate multilevel osseous neural foraminal stenosis. No severe osseous central canal stenosis. No acute fracture. No aggressive appearing focal osseous lesion or focal pathologic  process. Soft tissues and spinal canal: No prevertebral fluid or swelling. No visible canal hematoma. Upper chest: Unremarkable. Other: Atherosclerotic plaque of the carotid arteries within the neck. IMPRESSION: 1. No acute intracranial abnormality. 2. No acute displaced facial fracture. 3. No acute displaced fracture or traumatic listhesis of the cervical spine. 4. 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 Associated at least moderate multilevel osseous neural foraminal stenosis. Electronically Signed   By: Tish Frederickson M.D.   On: 04/18/2022 18:42   DG Chest Portable 1 View  Result Date: 04/18/2022 CLINICAL DATA:  fall EXAM: PORTABLE CHEST 1 VIEW COMPARISON:  Chest x-ray 06/18/2021 FINDINGS: The heart and mediastinal contours are unchanged. Aortic calcification. No focal consolidation. No pulmonary edema. No pleural effusion. No pneumothorax. No acute osseous abnormality. IMPRESSION: 1. No active disease. 2.  Aortic Atherosclerosis (ICD10-I70.0). Electronically Signed   By: Tish Frederickson M.D.   On: 04/18/2022 17:21    EKG: I independently viewed the EKG done and my findings are as followed: Sinus rhythm rate of 62.  Nonspecific ST-T changes.  QTc 411.  Assessment/Plan Present on Admission:  Syncope  Principal Problem:   Syncope  Syncope, unclear etiology. Unwitnessed fall. Patient unable to provide detailed information due to advanced dementia. CT head and CT cervical spine nonacute. Orthostatic vital signs 2D echo Fall precautions  Dementia with agitation Bipolar 1 disorder Resume home medications once able to pass a swallow evaluation Reorient as needed.  Possible dysphagia Speech therapist consulted for swallow evaluation. Aspiration precautions.  Hypothyroidism Resume levothyroxine   DVT prophylaxis: Subcu Lovenox daily  Code Status: DNR  Family Communication: Daughter-in-law at bedside.  Disposition Plan: Admitted to telemetry unit  Consults  called: None.  Admission status: Observation status.   Status is: Observation    Darlin Drop MD  [facility] Pager [phone]  If 7PM-7AM, please contact night-coverage [email or web address] Password [email or web address]  04/19/2022, 3:11 AM

## 2022-04-20 ENCOUNTER — Inpatient Hospital Stay (HOSPITAL_COMMUNITY): Payer: Medicare Other

## 2022-04-20 DIAGNOSIS — R55 Syncope and collapse: Secondary | ICD-10-CM | POA: Diagnosis not present

## 2022-04-20 LAB — URINE CULTURE: Culture: NO GROWTH

## 2022-04-20 MED ORDER — DEXTROSE-NACL 5-0.45 % IV SOLN: INTRAVENOUS | Status: DC

## 2022-04-20 MED ORDER — LORATADINE 10 MG PO TABS: 10.0000 mg | ORAL_TABLET | Freq: Every day | ORAL | Status: DC | PRN

## 2022-04-20 NOTE — Progress Notes (Signed)
Modified Barium Swallow Progress Note  Patient Details  Name: Trevor Cook MRN: [id number] Date of Birth: 21-Jun-1945  Today's Date: 04/20/2022  Modified Barium Swallow completed.  Full report located under Chart Review in the Imaging Section.  Brief recommendations include the following:  Clinical Impression  Pt presents with severe oral and moderate pharyngeal dysphagia with sensorimotor deficits. Prolonged oral holding without oral transiting - thus no pharyngeal swallow observed with small liquid boluses as well with oral residuals. SLP provided oral suction before, during and after his MBS study to clear secretions that he is not swallowing. Pharyngeal swallow is delayed with boluses pooling to pyriform sinus prior to swallow triggering.  Pharyngeal swallow is marginally weak with mild-mod retention that mixes with copious secretions.  Aspiration of thin from delayed swallow noted as well as aspiration after the swallow from copious secretions mixing with barium and spilling into open airway. Pt with initial cough aspiration but this did not clear it and further aspiration episodes were not followed by a cough.  Mr Colasanti does begin vocalizing with aspiration -which SLP suspects is his body's way of trying to clear his airway.  He unfortunately does not follow directions due to his dementia.  He is aspirating secretions overtly and will aspirate regardless of care plan and his dysphagia does not allow adequacy of nutrition.  Reasonable plan in this SLPs opinion is to consider comfort po with known aspiration.    Pending plan, would recommend to continue crushed medications (if not contraindicated) with nectar - and oral suction before, during and after swallowing.   SLP will follow up for family education as indicated but given pt's dementia, pt unable to participate in swallow rehab.  Hopefully his swallow will improve marginally with recovery from his current illness - but prognosis for  swallow is guarded.        Swallow Evaluation Recommendations       SLP Diet Recommendations: NPO (medication needed with nectar liquids vcrushed)       Other Recommendations: Have oral suction available (oral suction before during and after any intake)   Rolena Infante, MS [facility] SLP Acute Rehab Services Office [phone] Pager [phone]  Chales Abrahams 04/20/2022,10:03 AM

## 2022-04-20 NOTE — Progress Notes (Signed)
MBS completed, full report to follow. Pt presents with severe oral and moderate pharyngeal dysphagia with sensorimotor deficits. Prolonged oral holding without oral transiting - thus no pharyngeal swallow observed with small liquid boluses as well with oral residuals. SLP provided oral suction before, during and after his MBS study to clear secretions that he is not swallowing. Pharyngeal swallow is delayed with boluses pooling to pyriform sinus prior to swallow triggering.  Pharyngeal swallow is marginally weak with mild-mod retention that mixes with copious secretions.  Aspiration of thin from delayed swallow noted as well as aspiration after the swallow from copious secretions mixing with barium and spilling into open airway. Pt with initial cough aspiration but this did not clear it and further aspiration episodes were not followed by a cough.  Trevor Cook does begin vocalizing with aspiration -which SLP suspects is his body's way of trying to clear his airway.  He unfortunately does not follow directions due to his dementia.  He is aspirating secretions overtly and will aspirate regardless of care plan and his dysphagia does not allow adequacy of nutrition.  Reasonable plan in this SLPs opinion is to consider comfort po with known aspiration.  Pending plan, would recommend to continue crushed medications (if not contraindicated) with nectar - and oral suction before, during and after swallowing. SLP will follow up for family education as indicated but given pt's dementia, pt unable to participate in swallow rehab.  Hopefully his swallow will improve marginally with recovery from his current illness - but prognosis for swallow is guarded.    Rolena Infante, MS [facility] SLP Acute Rehab Services Office [phone] Pager [phone]

## 2022-04-20 NOTE — Progress Notes (Signed)
Pt become agitated around 2130, 04/19/2022. Restraints applied. RN will continue the plan of care.

## 2022-04-20 NOTE — H&P (Signed)
PROGRESS NOTE    Trevor Cook  LEX:[id number] DOB: 1945-12-31 DOA: 04/18/2022 PCP: Aida Puffer, MD  Outpatient Specialists:     Brief Narrative:  Patient is a 76 year old male with past medical history significant for alcohol dementia, Wernicke Korsakoff syndrome, bipolar, epilepsy, CVA and hypothyroidism.  Patient is a skilled nursing facility resident.  Patient was admitted with syncope.  Workup is in progress.  CT head, pursue left facial and cervical has not shown any new changes.  Swallowing evaluation done by the speech therapy team revealed that patient is a high risk for aspiration.  Currently, patient is still n.p.o. except for medication.  Patient is on IV fluids.  Discussed with the patient's son and daughter-in-law and updated them extensively.  Will consult palliative care team to assist in defining goals of care.  Patient is currently DNR.  04/20/2022: No history from patient.   Assessment & Plan:   Principal Problem:   Syncope   Syncope, unclear etiology. Unwitnessed fall. CT head and CT cervical spine-nonrevealing.   Follow orthostatic vital signs 2D echo revealed EF of 60 to 65% with indeterminate diastolic function. Continue IV fluids Fall precautions  Dementia with agitation: -No behavioral problems currently.  Bipolar 1 disorder: -Stable.     Possible dysphagia -Speech therapy has recommended n.p.o. except for medications. -High risk for aspiration. -IV fluids. -Consult palliative care to assist in discussing goals of care.   -Aspiration precautions.   Hypothyroidism Resume levothyroxine  History of alcohol abuse/Wernicke Korsakoff syndrome:   DVT prophylaxis: Subcutaneous Lovenox Code Status: DO NOT RESUSCITATE Family Communication: Son and daughter-in-law Disposition Plan: Possibly back to skilled nursing facility   Consultants:  Chief care team is being consulted to assist with defining goals of care  Procedures:   None  Antimicrobials:  None   Subjective: No history from patient  Objective: Vitals:   04/19/22 2236 04/20/22 0255 04/20/22 1150 04/20/22 1254  BP: 123/71 (!) 153/69 (!) 143/79 (!) 151/101  Pulse: (!) 51 (!) 46 (!) 57 (!) 52  Resp: 18 17  18   Temp: 98.5 F (36.9 C) 97.6 F (36.4 C)  97.6 F (36.4 C)  TempSrc: Oral Oral  Oral  SpO2: 97% 97%  100%    Intake/Output Summary (Last 24 hours) at 04/20/2022 1440 Last data filed at 04/20/2022 0336 Gross per 24 hour  Intake 206 ml  Output 25 ml  Net 181 ml   There were no vitals filed for this visit.  Examination:  General exam: Poor historian.  Very dysarthric.  Facial asymmetry.  Parkinsonian facies.   HEENT: Pallor CIWA Protocol. Respiratory system: Clear to auscultation. Respiratory effort normal. Cardiovascular system: S1 & S2 heard Gastrointestinal system: Abdomen is obese, soft and nontender.   Central nervous system: Awake.  Dysarthric.  ?Nonverbal.   Extremities: No leg edema.  Data Reviewed: I have personally reviewed following labs and imaging studies  CBC: Recent Labs  Lab 04/18/22 1629 04/19/22 0341  WBC 7.7 8.2  HGB 12.5* 12.1*  HCT 37.0* 36.0*  MCV 98.9 98.9  PLT 224 216   Basic Metabolic Panel: Recent Labs  Lab 04/18/22 1629 04/19/22 0341  NA 139 143  K 4.0 3.8  CL 103 108  CO2 30 29  GLUCOSE 98 86  BUN 21 16  CREATININE 0.54* 0.52*  CALCIUM 9.3 9.7  MG  --  2.2  PHOS  --  3.2   GFR: CrCl cannot be calculated (Unknown ideal weight.). Liver Function Tests: Recent Labs  Lab 04/19/22 0341  AST 17  ALT 22  ALKPHOS 48  BILITOT 0.7  PROT 6.3*  ALBUMIN 3.7   No results for input(s): "LIPASE", "AMYLASE" in the last 168 hours. No results for input(s): "AMMONIA" in the last 168 hours. Coagulation Profile: No results for input(s): "INR", "PROTIME" in the last 168 hours. Cardiac Enzymes: No results for input(s): "CKTOTAL", "CKMB", "CKMBINDEX", "TROPONINI" in the last 168  hours. BNP (last 3 results) No results for input(s): "PROBNP" in the last 8760 hours. HbA1C: No results for input(s): "HGBA1C" in the last 72 hours. CBG: No results for input(s): "GLUCAP" in the last 168 hours. Lipid Profile: No results for input(s): "CHOL", "HDL", "LDLCALC", "TRIG", "CHOLHDL", "LDLDIRECT" in the last 72 hours. Thyroid Function Tests: No results for input(s): "TSH", "T4TOTAL", "FREET4", "T3FREE", "THYROIDAB" in the last 72 hours. Anemia Panel: No results for input(s): "VITAMINB12", "FOLATE", "FERRITIN", "TIBC", "IRON", "RETICCTPCT" in the last 72 hours. Urine analysis:    Component Value Date/Time   COLORURINE YELLOW 04/19/2022 0320   APPEARANCEUR CLEAR 04/19/2022 0320   LABSPEC 1.012 04/19/2022 0320   PHURINE 7.0 04/19/2022 0320   GLUCOSEU NEGATIVE 04/19/2022 0320   HGBUR NEGATIVE 04/19/2022 0320   BILIRUBINUR NEGATIVE 04/19/2022 0320   KETONESUR 5 (A) 04/19/2022 0320   PROTEINUR NEGATIVE 04/19/2022 0320   UROBILINOGEN 0.2 04/20/2010 1643   NITRITE NEGATIVE 04/19/2022 0320   LEUKOCYTESUR NEGATIVE 04/19/2022 0320   Sepsis Labs: @LABRCNTIP (procalcitonin:4,lacticidven:4)  ) Recent Results (from the past 240 hour(s))  Resp panel by RT-PCR (RSV, Flu A&B, Covid) Anterior Nasal Swab     Status: None   Collection Time: 04/19/22  3:21 AM   Specimen: Anterior Nasal Swab  Result Value Ref Range Status   SARS Coronavirus 2 by RT PCR NEGATIVE NEGATIVE Final    Comment: (NOTE) SARS-CoV-2 target nucleic acids are NOT DETECTED.  The SARS-CoV-2 RNA is generally detectable in upper respiratory specimens during the acute phase of infection. The lowest concentration of SARS-CoV-2 viral copies this assay can detect is 138 copies/mL. A negative result does not preclude SARS-Cov-2 infection and should not be used as the sole basis for treatment or other patient management decisions. A negative result may occur with  improper specimen collection/handling, submission of  specimen other than nasopharyngeal swab, presence of viral mutation(s) within the areas targeted by this assay, and inadequate number of viral copies(<138 copies/mL). A negative result must be combined with clinical observations, patient history, and epidemiological information. The expected result is Negative.  Fact Sheet for Patients:  EntrepreneurPulse.com.au  Fact Sheet for Healthcare Providers:  IncredibleEmployment.be  This test is no t yet approved or cleared by the [place] FDA and  has been authorized for detection and/or diagnosis of SARS-CoV-2 by FDA under an Emergency Use Authorization (EUA). This EUA will remain  in effect (meaning this test can be used) for the duration of the COVID-19 declaration under Section 564(b)(1) of the Act, 21 U.S.C.section 360bbb-3(b)(1), unless the authorization is terminated  or revoked sooner.       Influenza A by PCR NEGATIVE NEGATIVE Final   Influenza B by PCR NEGATIVE NEGATIVE Final    Comment: (NOTE) The Xpert Xpress SARS-CoV-2/FLU/RSV plus assay is intended as an aid in the diagnosis of influenza from Nasopharyngeal swab specimens and should not be used as a sole basis for treatment. Nasal washings and aspirates are unacceptable for Xpert Xpress SARS-CoV-2/FLU/RSV testing.  Fact Sheet for Patients: EntrepreneurPulse.com.au  Fact Sheet for Healthcare Providers: IncredibleEmployment.be  This test is not yet approved or cleared  by the [place] and has been authorized for detection and/or diagnosis of SARS-CoV-2 by FDA under an Emergency Use Authorization (EUA). This EUA will remain in effect (meaning this test can be used) for the duration of the COVID-19 declaration under Section 564(b)(1) of the Act, 21 U.S.C. section 360bbb-3(b)(1), unless the authorization is terminated or revoked.     Resp Syncytial Virus by PCR NEGATIVE NEGATIVE Final     Comment: (NOTE) Fact Sheet for Patients: BloggerCourse.com  Fact Sheet for Healthcare Providers: SeriousBroker.it  This test is not yet approved or cleared by the [place] FDA and has been authorized for detection and/or diagnosis of SARS-CoV-2 by FDA under an Emergency Use Authorization (EUA). This EUA will remain in effect (meaning this test can be used) for the duration of the COVID-19 declaration under Section 564(b)(1) of the Act, 21 U.S.C. section 360bbb-3(b)(1), unless the authorization is terminated or revoked.  Performed at [facility], [address] [place], [place] [address]   Urine Culture     Status: None   Collection Time: 04/19/22  3:30 AM   Specimen: Urine, Clean Catch  Result Value Ref Range Status   Specimen Description   Final    URINE, CLEAN CATCH Performed at [facility], [address], [place], [place] [address]    Special Requests   Final    NONE Performed at [facility], [address] [place], [place] [address]    Culture   Final    NO GROWTH Performed at [facility], [address], [place], [place] [address]    Report Status 04/20/2022 FINAL  Final  MRSA Next Gen by PCR, Nasal     Status: None   Collection Time: 04/19/22  9:30 PM   Specimen: Nasal Mucosa; Nasal Swab  Result Value Ref Range Status   MRSA by PCR Next Gen NOT DETECTED NOT DETECTED Final    Comment: (NOTE) The GeneXpert MRSA Assay (FDA approved for NASAL specimens only), is one component of a comprehensive MRSA colonization surveillance program. It is not intended to diagnose MRSA infection nor to guide or monitor treatment for MRSA infections. Test performance is not FDA approved in patients less than 25 years old. Performed at [facility], [address], [place], [place] [address]          Radiology Studies: DG Swallowing  Func-Speech Pathology  Result Date: 04/20/2022 Table formatting from the original result was not included. Objective Swallowing Evaluation: Type of Study: MBS-Modified Barium Swallow Study  Patient Details Name: Benn Tarver MRN: [id number] Date of Birth: 12/04/1945 Today's Date: 04/20/2022 Time: SLP Start Time (ACUTE ONLY): 0850 -SLP Stop Time (ACUTE ONLY): 0915 SLP Time Calculation (min) (ACUTE ONLY): 25 min Past Medical History: Past Medical History: Diagnosis Date  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HCC)   Bipolar 1 disorder (HCC)   Cerebellar disorder   Epilepsy (HCC)   Hypothyroid   Insomnia   Other postablative hypothyroidism   Stutter   Tremor   Unspecified condition of brain   Wernicke's encephalopathy  Past Surgical History: Past Surgical History: Procedure Laterality Date  NO PAST SURGERIES   HPI: Jerrad Mendibles is a 76 y.o. male with medical history significant for dementia, bipolar 1 disorder, epilepsy, CVA, hypothyroidism, who presented to [facility] ED from SNF after passing out. pt has h/o dementia.  He was [facility] on the ground at [facility].  His fall was unwitnessed.  EMS was activated, 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  [place]) Bipolar 1 disorder Cerebellar disorder Epilepsy ([place]) Hypothyroid Insomnia Other postablative hypothyroidism Stutter Tremor Unspecified condition of brain Wernicke's encephalopathy carbidopa levadopa .  Pt has h/o Multilevel moderate degenerative changes of the spine. Pt has been previously seen by SLP during prior hospital admission and progressed to tolerating dys3/thin diet with self feeding.  No family present at this time.  Subjective: pt awake in chair  Recommendations for follow up therapy are one component of a multi-disciplinary discharge planning process, led by the attending physician.  Recommendations may be updated based on patient status, additional functional criteria and insurance authorization. Assessment / Plan / Recommendation   04/20/2022  10:00 AM Clinical  Impressions Clinical Impression Pt presents with severe oral and moderate pharyngeal dysphagia with sensorimotor deficits. Prolonged oral holding without oral transiting - thus no pharyngeal swallow observed with small liquid boluses as well with oral residuals. SLP provided oral suction before, during and after his MBS study to clear secretions that he is not swallowing. Pharyngeal swallow is delayed with boluses pooling to pyriform sinus prior to swallow triggering.  Pharyngeal swallow is marginally weak with mild-mod retention that mixes with copious secretions.  Aspiration of thin from delayed swallow noted as well as aspiration after the swallow from copious secretions mixing with barium and spilling into open airway. Pt with initial cough aspiration but this did not clear it and further aspiration episodes were not followed by a cough.  Mr Trotti does begin vocalizing with aspiration -which SLP suspects is his body's way of trying to clear his airway.  He unfortunately does not follow directions due to his dementia.  He is aspirating secretions overtly and will aspirate regardless of care plan and his dysphagia does not allow adequacy of nutrition.  Reasonable plan in this SLPs opinion is to consider comfort po with known aspiration.  Pending plan, would recommend to continue crushed medications (if not contraindicated) with nectar - and oral suction before, during and after swallowing. SLP will follow up for family education as indicated but given pt's dementia, pt unable to participate in swallow rehab.  Hopefully his swallow will improve marginally with recovery from his current illness - but prognosis for swallow is guarded.     SLP Visit Diagnosis Dysphagia, oropharyngeal phase (R13.12) Impact on safety and function Severe aspiration risk;Risk for inadequate nutrition/hydration     04/20/2022  10:00 AM Treatment Recommendations Treatment Recommendations Therapy as outlined in treatment plan below      04/20/2022  10:01 AM Prognosis Prognosis for Safe Diet Advancement Guarded Barriers to Reach Goals Severity of deficits;Time post onset   04/20/2022  10:00 AM Diet Recommendations SLP Diet Recommendations NPO     04/20/2022  10:00 AM Other Recommendations Other Recommendations Have oral suction available Follow Up Recommendations Follow physician's recommendations for discharge plan and follow up therapies Functional Status Assessment Patient has had a recent decline in their functional status and/or demonstrates limited ability to make significant improvements in function in a reasonable and predictable amount of time   04/20/2022  10:00 AM Frequency and Duration  Speech Therapy Frequency (ACUTE ONLY) min 1 x/week Treatment Duration 2 weeks     04/20/2022   9:51 AM Oral Phase Oral Phase Impaired Oral - Nectar Teaspoon Weak lingual manipulation;Other (Comment);Reduced posterior propulsion;Pocketing in anterior sulcus;Delayed oral transit;Holding of bolus;Decreased bolus cohesion Oral - Nectar Cup Other (Comment);Weak lingual manipulation;Reduced posterior propulsion;Pocketing in anterior sulcus;Incomplete tongue to palate contact;Delayed oral transit;Holding of bolus;Decreased bolus cohesion;Premature spillage Oral - Nectar Straw Other (  Comment);Weak lingual manipulation;Lingual/palatal residue;Reduced posterior propulsion;Pocketing in anterior sulcus;Incomplete tongue to palate contact;Decreased velopharyngeal closure;Holding of bolus;Delayed oral transit;Premature spillage;Decreased bolus cohesion Oral - Thin Teaspoon Other (Comment);Weak lingual manipulation;Lingual/palatal residue;Reduced posterior propulsion;Pocketing in anterior sulcus;Incomplete tongue to palate contact;Delayed oral transit;Premature spillage;Decreased bolus cohesion Oral - Thin Cup Weak lingual manipulation;Lingual/palatal residue;Premature spillage;Reduced posterior propulsion;Pocketing in anterior sulcus;Incomplete tongue to palate  contact;Decreased bolus cohesion;Delayed oral transit Oral - Thin Straw Weak lingual manipulation;Lingual/palatal residue;Premature spillage;Reduced posterior propulsion;Pocketing in anterior sulcus;Incomplete tongue to palate contact;Delayed oral transit;Decreased bolus cohesion Oral - Puree Weak lingual manipulation;Lingual/palatal residue;Premature spillage;Reduced posterior propulsion;Incomplete tongue to palate contact;Delayed oral transit;Decreased bolus cohesion Oral - Mech Soft Weak lingual manipulation;Impaired mastication;Lingual/palatal residue;Premature spillage;Reduced posterior propulsion;Incomplete tongue to palate contact;Delayed oral transit;Decreased bolus cohesion Oral Phase - Comment despite counting cues and placement of bolus on left side of oral cavity, pt did not consistently elicit swallow, severely dystonic lingual movement noted with gross oral retention of secretions mixed with barium    04/20/2022   9:56 AM Pharyngeal Phase Pharyngeal Phase Impaired Pharyngeal- Nectar Teaspoon Delayed swallow initiation-pyriform sinuses;Lateral channel residue;Reduced airway/laryngeal closure;Reduced laryngeal elevation;Pharyngeal residue - pyriform Pharyngeal Material does not enter airway Pharyngeal- Nectar Cup Delayed swallow initiation-pyriform sinuses;Penetration/Apiration after swallow;Inter-arytenoid space residue;Reduced laryngeal elevation;Reduced airway/laryngeal closure;Pharyngeal residue - pyriform Pharyngeal Material enters airway, passes BELOW cords without attempt by patient to eject out (silent aspiration) Pharyngeal- Nectar Straw Delayed swallow initiation-pyriform sinuses;Reduced laryngeal elevation;Reduced airway/laryngeal closure;Lateral channel residue;Pharyngeal residue - pyriform Pharyngeal Material enters airway, passes BELOW cords without attempt by patient to eject out (silent aspiration) Pharyngeal- Thin Teaspoon -- Pharyngeal- Thin Cup Delayed swallow initiation-pyriform  sinuses;Lateral channel residue;Reduced laryngeal elevation;Reduced airway/laryngeal closure;Pharyngeal residue - pyriform Pharyngeal Material enters airway, passes BELOW cords without attempt by patient to eject out (silent aspiration);Material enters airway, passes BELOW cords and not ejected out despite cough attempt by patient Pharyngeal- Thin Straw Delayed swallow initiation-pyriform sinuses;Reduced laryngeal elevation;Reduced airway/laryngeal closure;Pharyngeal residue - pyriform;Lateral channel residue Pharyngeal Material enters airway, passes BELOW cords without attempt by patient to eject out (silent aspiration) Pharyngeal- Puree Delayed swallow initiation-vallecula;Pharyngeal residue - valleculae Pharyngeal Material does not enter airway Pharyngeal- Mechanical Soft Delayed swallow initiation-vallecula;Pharyngeal residue - valleculae Pharyngeal Material does not enter airway    04/20/2022   9:59 AM Cervical Esophageal Phase  Cervical Esophageal Phase Impaired Cervical Esophageal Comment impaired clearance into cervical esophagus Kathleen Lime, MS [facility] SLP Acute Rehab Services Office [phone] Pager [phone] Macario Golds 04/20/2022, 10:02 AM                     ECHOCARDIOGRAM COMPLETE  Result Date: 04/19/2022    ECHOCARDIOGRAM REPORT   Patient Name:   DAG ROHLOFF Date of Exam: 04/19/2022 Medical Rec #:  [id number]      Height:       67.0 in Accession #:    [id number]     Weight:       215.8 lb Date of Birth:  1946/04/08      BSA:          2.089 m Patient Age:    45 years       BP:           137/61 mmHg Patient Gender: M              HR:           69 bpm. Exam Location:  Inpatient Procedure: 2D Echo, Cardiac Doppler and Color Doppler Indications:    R55 Syncope  History:  Patient has no prior history of Echocardiogram examinations.                 Signs/Symptoms:Syncope.  Sonographer:    Eulah Pont RDCS Referring Phys: [id number] Darlin Drop  Sonographer Comments: Image acquisition  challenging due to uncooperative patient. IMPRESSIONS  1. Left ventricular ejection fraction, by estimation, is 60 to 65%. The left ventricle has normal function. The left ventricle has no regional wall motion abnormalities. Left ventricular diastolic parameters are indeterminate.  2. Right ventricular systolic function is normal. The right ventricular size is normal. Tricuspid regurgitation signal is inadequate for assessing PA pressure.  3. The mitral valve is normal in structure. No evidence of mitral valve regurgitation.  4. The aortic valve was not well visualized. Aortic valve regurgitation is not visualized. Aortic valve sclerosis/calcification is present, without any evidence of aortic stenosis.  5. Aortic dilatation noted. There is dilatation of the ascending aorta, measuring 40 mm.  6. The inferior vena cava is normal in size with greater than 50% respiratory variability, suggesting right atrial pressure of 3 mmHg. FINDINGS  Left Ventricle: Left ventricular ejection fraction, by estimation, is 60 to 65%. The left ventricle has normal function. The left ventricle has no regional wall motion abnormalities. The left ventricular internal cavity size was normal in size. There is  no left ventricular hypertrophy. Left ventricular diastolic parameters are indeterminate. Right Ventricle: The right ventricular size is normal. No increase in right ventricular wall thickness. Right ventricular systolic function is normal. Tricuspid regurgitation signal is inadequate for assessing PA pressure. Left Atrium: Left atrial size was normal in size. Right Atrium: Right atrial size was normal in size. Pericardium: There is no evidence of pericardial effusion. Mitral Valve: The mitral valve is normal in structure. No evidence of mitral valve regurgitation. Tricuspid Valve: The tricuspid valve is normal in structure. Tricuspid valve regurgitation is trivial. Aortic Valve: The aortic valve was not well visualized. Aortic valve  regurgitation is not visualized. Aortic valve sclerosis/calcification is present, without any evidence of aortic stenosis. Pulmonic Valve: The pulmonic valve was not well visualized. Pulmonic valve regurgitation is not visualized. Aorta: The aortic root is normal in size and structure and aortic dilatation noted. There is dilatation of the ascending aorta, measuring 40 mm. Venous: The inferior vena cava is normal in size with greater than 50% respiratory variability, suggesting right atrial pressure of 3 mmHg. IAS/Shunts: The interatrial septum was not well visualized.  LEFT VENTRICLE PLAX 2D LVIDd:         4.30 cm     Diastology LVIDs:         2.60 cm     LV e' medial:    6.40 cm/s LV PW:         1.00 cm     LV E/e' medial:  10.6 LV IVS:        0.80 cm     LV e' lateral:   7.45 cm/s LVOT diam:     2.20 cm     LV E/e' lateral: 9.1 LV SV:         103 LV SV Index:   49 LVOT Area:     3.80 cm  LV Volumes (MOD) LV vol d, MOD A2C: 98.8 ml LV vol d, MOD A4C: 87.8 ml LV vol s, MOD A2C: 37.4 ml LV vol s, MOD A4C: 31.6 ml LV SV MOD A2C:     61.4 ml LV SV MOD A4C:     87.8 ml LV  SV MOD BP:      59.9 ml RIGHT VENTRICLE RV S prime:     11.70 cm/s TAPSE (M-mode): 2.5 cm LEFT ATRIUM             Index        RIGHT ATRIUM           Index LA diam:        3.00 cm 1.44 cm/m   RA Area:     13.50 cm LA Vol (A2C):   27.4 ml 13.12 ml/m  RA Volume:   28.70 ml  13.74 ml/m LA Vol (A4C):   27.9 ml 13.36 ml/m LA Biplane Vol: 29.5 ml 14.12 ml/m  AORTIC VALVE LVOT Vmax:   124.00 cm/s LVOT Vmean:  69.800 cm/s LVOT VTI:    0.271 m  AORTA Ao Root diam: 3.50 cm Ao Asc diam:  4.00 cm MITRAL VALVE MV Area (PHT): 3.89 cm    SHUNTS MV Decel Time: 195 msec    Systemic VTI:  0.27 m MV E velocity: 67.70 cm/s  Systemic Diam: 2.20 cm MV A velocity: 68.60 cm/s MV E/A ratio:  0.99 Oswaldo Milian MD Electronically signed by Oswaldo Milian MD Signature Date/Time: 04/19/2022/2:25:48 PM    Final    CT Maxillofacial Wo Contrast  Result Date:  04/18/2022 CLINICAL DATA:  Facial trauma, blunt; Head trauma, moderate-severe; Neck trauma (Age >= 65y) EXAM: CT HEAD WITHOUT CONTRAST CT MAXILLOFACIAL WITHOUT CONTRAST CT CERVICAL SPINE WITHOUT CONTRAST TECHNIQUE: Multidetector CT imaging of the head, cervical spine, and maxillofacial structures were performed using the standard protocol without intravenous contrast. Multiplanar CT image reconstructions of the cervical spine and maxillofacial structures were also generated. RADIATION DOSE REDUCTION: This exam was performed according to the departmental dose-optimization program which includes automated exposure control, adjustment of the mA and/or kV according to patient size and/or use of iterative reconstruction technique. COMPARISON:  CT head 06/17/2021, CT cervical spine 05/12/2020 FINDINGS: CT HEAD FINDINGS Brain: Cerebral ventricle sizes are concordant with the degree of cerebral volume loss. Patchy and confluent areas of decreased attenuation are noted throughout the deep and periventricular white matter of the cerebral hemispheres bilaterally, compatible with chronic microvascular ischemic disease. No evidence of large-territorial acute infarction. No parenchymal hemorrhage. No mass lesion. No extra-axial collection. No mass effect or midline shift. No hydrocephalus. Basilar cisterns are patent. Vascular: No hyperdense vessel. Atherosclerotic calcifications are present within the cavernous internal carotid and vertebral arteries. Skull: No acute fracture or focal lesion. Other: None. CT MAXILLOFACIAL FINDINGS Osseous: No fracture or mandibular dislocation. No destructive process. Sinuses/Orbits: Bilateral maxillary mucosal thickening. Otherwise paranasal sinuses and mastoid air cells are clear. Bilateral lens replacement. Otherwise the orbits are unremarkable. Soft tissues: Right maxillary subcutaneus soft tissue hematoma formation. CT CERVICAL SPINE FINDINGS Alignment: Straightening of the normal cervical  lordosis likely due to positioning and degenerative changes. Stable grade 1 anterolisthesis of C7 on T1. Stable grade 1 anterolisthesis of C2 on C3. Skull base and vertebrae: Multilevel moderate degenerative changes of the spine. Associated at least moderate multilevel osseous neural foraminal stenosis. No severe osseous central canal stenosis. No acute fracture. No aggressive appearing focal osseous lesion or focal pathologic process. Soft tissues and spinal canal: No prevertebral fluid or swelling. No visible canal hematoma. Upper chest: Unremarkable. Other: Atherosclerotic plaque of the carotid arteries within the neck. IMPRESSION: 1. No acute intracranial abnormality. 2. No acute displaced facial fracture. 3. No acute displaced fracture or traumatic listhesis of the cervical spine. 4. Multilevel moderate degenerative changes  of the spine. Associated at least moderate multilevel osseous neural foraminal stenosis. Electronically Signed   By: Iven Finn M.D.   On: 04/18/2022 18:42   CT Head Wo Contrast  Result Date: 04/18/2022 CLINICAL DATA:  Facial trauma, blunt; Head trauma, moderate-severe; Neck trauma (Age >= 65y) EXAM: CT HEAD WITHOUT CONTRAST CT MAXILLOFACIAL WITHOUT CONTRAST CT CERVICAL SPINE WITHOUT CONTRAST TECHNIQUE: Multidetector CT imaging of the head, cervical spine, and maxillofacial structures were performed using the standard protocol without intravenous contrast. Multiplanar CT image reconstructions of the cervical spine and maxillofacial structures were also generated. RADIATION DOSE REDUCTION: This exam was performed according to the departmental dose-optimization program which includes automated exposure control, adjustment of the mA and/or kV according to patient size and/or use of iterative reconstruction technique. COMPARISON:  CT head 06/17/2021, CT cervical spine 05/12/2020 FINDINGS: CT HEAD FINDINGS Brain: Cerebral ventricle sizes are concordant with the degree of cerebral volume  loss. Patchy and confluent areas of decreased attenuation are noted throughout the deep and periventricular white matter of the cerebral hemispheres bilaterally, compatible with chronic microvascular ischemic disease. No evidence of large-territorial acute infarction. No parenchymal hemorrhage. No mass lesion. No extra-axial collection. No mass effect or midline shift. No hydrocephalus. Basilar cisterns are patent. Vascular: No hyperdense vessel. Atherosclerotic calcifications are present within the cavernous internal carotid and vertebral arteries. Skull: No acute fracture or focal lesion. Other: None. CT MAXILLOFACIAL FINDINGS Osseous: No fracture or mandibular dislocation. No destructive process. Sinuses/Orbits: Bilateral maxillary mucosal thickening. Otherwise paranasal sinuses and mastoid air cells are clear. Bilateral lens replacement. Otherwise the orbits are unremarkable. Soft tissues: Right maxillary subcutaneus soft tissue hematoma formation. CT CERVICAL SPINE FINDINGS Alignment: Straightening of the normal cervical lordosis likely due to positioning and degenerative changes. Stable grade 1 anterolisthesis of C7 on T1. Stable grade 1 anterolisthesis of C2 on C3. Skull base and vertebrae: Multilevel moderate degenerative changes of the spine. Associated at least moderate multilevel osseous neural foraminal stenosis. No severe osseous central canal stenosis. No acute fracture. No aggressive appearing focal osseous lesion or focal pathologic process. Soft tissues and spinal canal: No prevertebral fluid or swelling. No visible canal hematoma. Upper chest: Unremarkable. Other: Atherosclerotic plaque of the carotid arteries within the neck. IMPRESSION: 1. No acute intracranial abnormality. 2. No acute displaced facial fracture. 3. No acute displaced fracture or traumatic listhesis of the cervical spine. 4. 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 Associated at least moderate multilevel osseous  neural foraminal stenosis. Electronically Signed   By: Iven Finn M.D.   On: 04/18/2022 18:42   CT Cervical Spine Wo Contrast  Result Date: 04/18/2022 CLINICAL DATA:  Facial trauma, blunt; Head trauma, moderate-severe; Neck trauma (Age >= 65y) EXAM: CT HEAD WITHOUT CONTRAST CT MAXILLOFACIAL WITHOUT CONTRAST CT CERVICAL SPINE WITHOUT CONTRAST TECHNIQUE: Multidetector CT imaging of the head, cervical spine, and maxillofacial structures were performed using the standard protocol without intravenous contrast. Multiplanar CT image reconstructions of the cervical spine and maxillofacial structures were also generated. RADIATION DOSE REDUCTION: This exam was performed according to the departmental dose-optimization program which includes automated exposure control, adjustment of the mA and/or kV according to patient size and/or use of iterative reconstruction technique. COMPARISON:  CT head 06/17/2021, CT cervical spine 05/12/2020 FINDINGS: CT HEAD FINDINGS Brain: Cerebral ventricle sizes are concordant with the degree of cerebral volume loss. Patchy and confluent areas of decreased attenuation are noted throughout the deep and periventricular white matter of the cerebral hemispheres bilaterally, compatible with chronic microvascular ischemic disease. No  evidence of large-territorial acute infarction. No parenchymal hemorrhage. No mass lesion. No extra-axial collection. No mass effect or midline shift. No hydrocephalus. Basilar cisterns are patent. Vascular: No hyperdense vessel. Atherosclerotic calcifications are present within the cavernous internal carotid and vertebral arteries. Skull: No acute fracture or focal lesion. Other: None. CT MAXILLOFACIAL FINDINGS Osseous: No fracture or mandibular dislocation. No destructive process. Sinuses/Orbits: Bilateral maxillary mucosal thickening. Otherwise paranasal sinuses and mastoid air cells are clear. Bilateral lens replacement. Otherwise the orbits are unremarkable.  Soft tissues: Right maxillary subcutaneus soft tissue hematoma formation. CT CERVICAL SPINE FINDINGS Alignment: Straightening of the normal cervical lordosis likely due to positioning and degenerative changes. Stable grade 1 anterolisthesis of C7 on T1. Stable grade 1 anterolisthesis of C2 on C3. Skull base and vertebrae: Multilevel moderate degenerative changes of the spine. Associated at least moderate multilevel osseous neural foraminal stenosis. No severe osseous central canal stenosis. No acute fracture. No aggressive appearing focal osseous lesion or focal pathologic process. Soft tissues and spinal canal: No prevertebral fluid or swelling. No visible canal hematoma. Upper chest: Unremarkable. Other: Atherosclerotic plaque of the carotid arteries within the neck. IMPRESSION: 1. No acute intracranial abnormality. 2. No acute displaced facial fracture. 3. No acute displaced fracture or traumatic listhesis of the cervical spine. 4. 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 Associated at least moderate multilevel osseous neural foraminal stenosis. Electronically Signed   By: Iven Finn M.D.   On: 04/18/2022 18:42   DG Chest Portable 1 View  Result Date: 04/18/2022 CLINICAL DATA:  fall EXAM: PORTABLE CHEST 1 VIEW COMPARISON:  Chest x-ray 06/18/2021 FINDINGS: The heart and mediastinal contours are unchanged. Aortic calcification. No focal consolidation. No pulmonary edema. No pleural effusion. No pneumothorax. No acute osseous abnormality. IMPRESSION: 1. No active disease. 2.  Aortic Atherosclerosis (ICD10-I70.0). Electronically Signed   By: Iven Finn M.D.   On: 04/18/2022 17:21        Scheduled Meds:  carbidopa-levodopa  0.5 tablet Oral TID   divalproex  250 mg Oral QHS   divalproex  500 mg Oral BID   enoxaparin (LOVENOX) injection  40 mg Subcutaneous Daily   [START ON 04/26/2022] levothyroxine  56 mcg Intravenous Daily   memantine  5 mg Oral QHS   OLANZapine  7.5 mg Oral  BID   propranolol  10 mg Oral BID   Continuous Infusions:  dextrose 5 % and 0.45% NaCl       LOS: 1 day    Time spent: 55 minutes    Dana Allan, MD  [facility] Pager #: [phone] 7PM-7AM contact night coverage as above

## 2022-04-21 ENCOUNTER — Inpatient Hospital Stay (HOSPITAL_COMMUNITY): Payer: Medicare Other

## 2022-04-21 DIAGNOSIS — R531 Weakness: Secondary | ICD-10-CM | POA: Diagnosis not present

## 2022-04-21 DIAGNOSIS — Z7189 Other specified counseling: Secondary | ICD-10-CM

## 2022-04-21 DIAGNOSIS — R55 Syncope and collapse: Secondary | ICD-10-CM | POA: Diagnosis not present

## 2022-04-21 DIAGNOSIS — Z515 Encounter for palliative care: Secondary | ICD-10-CM | POA: Diagnosis not present

## 2022-04-21 LAB — CBC WITH DIFFERENTIAL/PLATELET
Abs Immature Granulocytes: 0.02 10*3/uL (ref 0.00–0.07)
Basophils Absolute: 0 10*3/uL (ref 0.0–0.1)
Basophils Relative: 1 %
Eosinophils Absolute: 0.1 10*3/uL (ref 0.0–0.5)
Eosinophils Relative: 2 %
HCT: 41.9 % (ref 39.0–52.0)
Hemoglobin: 14.1 g/dL (ref 13.0–17.0)
Immature Granulocytes: 0 %
Lymphocytes Relative: 47 %
Lymphs Abs: 2.8 10*3/uL (ref 0.7–4.0)
MCH: 32.8 pg (ref 26.0–34.0)
MCHC: 33.7 g/dL (ref 30.0–36.0)
MCV: 97.4 fL (ref 80.0–100.0)
Monocytes Absolute: 0.5 10*3/uL (ref 0.1–1.0)
Monocytes Relative: 8 %
Neutro Abs: 2.5 10*3/uL (ref 1.7–7.7)
Neutrophils Relative %: 42 %
Platelets: 239 10*3/uL (ref 150–400)
RBC: 4.3 MIL/uL (ref 4.22–5.81)
RDW: 12.5 % (ref 11.5–15.5)
WBC: 5.9 10*3/uL (ref 4.0–10.5)
nRBC: 0 % (ref 0.0–0.2)

## 2022-04-21 LAB — RENAL FUNCTION PANEL
Albumin: 3.7 g/dL (ref 3.5–5.0)
Anion gap: 8 (ref 5–15)
BUN: 12 mg/dL (ref 8–23)
CO2: 28 mmol/L (ref 22–32)
Calcium: 9.4 mg/dL (ref 8.9–10.3)
Chloride: 102 mmol/L (ref 98–111)
Creatinine, Ser: 0.54 mg/dL — ABNORMAL LOW (ref 0.61–1.24)
GFR, Estimated: >60 mL/min (ref >60)
Glucose, Bld: 99 mg/dL (ref 70–99)
Phosphorus: 3 mg/dL (ref 2.5–4.6)
Potassium: 3.7 mmol/L (ref 3.5–5.1)
Sodium: 138 mmol/L (ref 135–145)

## 2022-04-21 LAB — MAGNESIUM: Magnesium: 2 mg/dL (ref 1.7–2.4)

## 2022-04-21 MED ORDER — THIAMINE MONONITRATE 100 MG PO TABS
100.0000 mg | ORAL_TABLET | Freq: Every day | ORAL | Status: DC
  Administered 2022-04-21 – 2022-05-07 (×17): 100 mg via ORAL
  Filled 2022-04-21 (×21): qty 1

## 2022-04-21 MED ORDER — AMLODIPINE BESYLATE 5 MG PO TABS
5.0000 mg | ORAL_TABLET | Freq: Every day | ORAL | Status: DC
  Administered 2022-04-21 – 2022-04-22 (×2): 5 mg via ORAL
  Filled 2022-04-21 (×2): qty 1

## 2022-04-21 NOTE — Progress Notes (Signed)
[facility] Progress Note Patient: Trevor Cook ZOX:[id number] DOB: 04-09-1946 DOA: 04/18/2022  DOS: the patient was seen and examined on 04/21/2022  Brief hospital course: PMH of Parkinson's disease, bipolar disorder, CVA, hypothyroidism, dementia, Warnicke's syndrome.  Presented to the hospital with passing out event.  Currently significantly confused and drowsy.  Unable to swallow safely.  Palliative care was consulted.  Family's goal is to focus on comfort with plans to discharge back to memory care unit with palliative care services. Assessment and Plan: Syncope. Unwitnessed fall. Telemetry so far negative. CT head unremarkable. Echocardiogram shows preserved EF.  No valvular abnormalities. Monitor for now.  Dementia with agitation. Bipolar 1 disorder. History of Parkinson's disease. Continue Sinemet. Unable to swallow safely. Family's goal is to focus on comfort.  Dysphagia. Unable to swallow safely. Underwent MBS. Currently NPO. Family's goal is to focus on comfort.  They understand the aspiration risk and would still be preferring to advance the diet. Will place the patient on dysphagia 2 diet.  Agitation. Currently in restraint. Monitor for now. Hypothyroidism. Continue Synthroid.   Subjective: Patient minimally interactive.  Unable to reach family or nursing home to get his baseline status. Was hospitalized earlier this year and at which point he was able to tell his name.  Physical Exam: General: in moderate distress;  Cardiovascular: S1 and S2 Present, no Murmur Respiratory: increased respiratory effort, Bilateral Air entry present, no Crackles, no wheezes Abdomen: Bowel Sound present, Non tender  Extremities: no edema Neurology: alert and non verbal, only moaning.  Data Reviewed: I have Reviewed nursing notes, Vitals, and Lab results. Since last encounter, pertinent lab results CBC and BMP   . I have ordered test including CBC and BMP  .    Disposition: Status is: Inpatient Remains inpatient appropriate because: Currently receiving IV fluids and n.p.o.  enoxaparin (LOVENOX) injection 40 mg Start: 04/19/22 1000   Family Communication: No one at bedside.  Unable to reach family on the phone. Level of care: Telemetry Continue telemetry for now. Vitals:   04/20/22 2216 04/21/22 0639 04/21/22 0933 04/21/22 1301  BP: (!) 159/84 (!) 166/79 (!) 164/75   Pulse: (!) 49 (!) 51 (!) 51   Resp: 16 20 16    Temp: 97.7 F (36.5 C) (!) 97.5 F (36.4 C)  (!) 97.4 F (36.3 C)  TempSrc: Oral Oral  Oral  SpO2: 100% 98% 100%      Author: , MD 04/21/2022 8:27 PM  Please look on [email or web address] to find out who is on call.

## 2022-04-21 NOTE — Plan of Care (Signed)
  Problem: Safety: Goal: Non-violent Restraint(s) Outcome: Progressing   Problem: Clinical Measurements: Goal: Ability to maintain clinical measurements within normal limits will improve Outcome: Progressing Goal: Will remain free from infection Outcome: Progressing   Problem: Skin Integrity: Goal: Risk for impaired skin integrity will decrease Outcome: Progressing

## 2022-04-21 NOTE — Hospital Course (Addendum)
PMH of Parkinson's disease, bipolar disorder, CVA, hypothyroidism, dementia, Warnicke's syndrome. Presented to the hospital with passing out event. Currently significantly confused and drowsy. Unable to swallow safely. Palliative care was consulted. Family's goal is to focus on comfort with plans to discharge back to memory care unit with palliative care services.

## 2022-04-21 NOTE — Consult Note (Signed)
Consultation Note Date: 04/21/2022   Patient Name: Trevor Cook  DOB: 05/24/1945  MRN: [id number]  Age / Sex: 75 y.o., male  PCP: Tamsen Roers, MD Referring Physician: Lavina Hamman, MD  Reason for Consultation: Establishing goals of care  HPI/Patient Profile: 76 y.o. male   admitted on 04/18/2022    Clinical Assessment and Goals of Care: 76 year old gentleman from facility, history of EtOH dementia [place] syndrome history of bipolar disorder epilepsy CVA and hypothyroidism.  Patient is at [facility].  Patient admitted with syncope CT head with no new changes, showing cerebral volume loss.  Swallowing evaluation done by speech therapy team revealing that the patient is high risk for aspiration.  Patient is on IV fluids. Palliative medicine team consulted for ongoing goals of care discussions.  Chart reviewed patient seen and examined discussed with [facility] MD call placed and I met with the patient's son Kazimir and daughter-in-law after they arrived at bedside and family meeting took place for broad goals of care discussions and for discussion regarding issues pertaining to comfort feeds versus artificial nutrition and hydration-see below.  Palliative medicine is specialized medical care for people living with serious illness. It focuses on providing relief from the symptoms and stress of a serious illness. The goal is to improve quality of life for both the patient and the family. Goals of care: Broad aims of medical therapy in relation to the patient's values and preferences. Our aim is to provide medical care aimed at enabling patients to achieve the goals that matter most to them, given the circumstances of their particular medical situation and their constraints.    HCPOA Son Araf and daughter-in-law, both present at bedside.  SUMMARY OF RECOMMENDATIONS   Goals of care discussions  undertaken.  Discussed with son and daughter-in-law who are present at bedside.  Life review performed.  Reviewed on the sons of phone video of the patient from 2 weeks ago where he is able to feed himself a small cheeseburger and fries.  Reviewed patient's videos and pictures from Thanksgiving as well.  Consider specific to comfort feeding versus artificial nutrition and hydration discussed.  Patient's son states that eating is very important to the patient.  He has had modified barium swallow studies before.  Patient and family understand the aspiration risk and wished to give the patient comfort feeds.  No feeding tube temporary or permanent as this will not provide for any quality of life.  Recommendation has been made for starting comfort feeds with careful monitoring.  Recommend addition of palliative services at patient's facility upon discharge.  Thank you for the consult.  Code Status/Advance Care Planning: DNR   Symptom Management:     Palliative Prophylaxis:  Delirium Protocol  Additional Recommendations (Limitations, Scope, Preferences): Initiate Comfort Feeding  Psycho-social/Spiritual:  Desire for further Chaplaincy support:yes Additional Recommendations: Education on Hospice  Prognosis:  Unable to determine  Discharge Planning:      Recommend addition of palliative services at [facility] upon discharge.    Primary Diagnoses:  Present on Admission:  Syncope   I have reviewed the medical record, interviewed the patient and family, and examined the patient. The following aspects are pertinent.  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[place])    Bipolar 1 disorder ([place])    Cerebellar disorder    Epilepsy ([place])    Hypothyroid    Insomnia    Other postablative hypothyroidism    Stutter    Tremor    Unspecified condition of brain    Wernicke's encephalopathy    Social History   Socioeconomic History   Marital status:  Single    Spouse name: Not on file   Number of children: 1   Years of education: n/a   Highest education level: Not on file  Occupational History   Occupation: Disabled  Tobacco Use   Smoking status: Former   Smokeless tobacco: Never  Substance and Sexual Activity   Alcohol use: Not Currently    Comment: quit 1999   Drug use: No   Sexual activity: Not on file  Other Topics Concern   Not on file  Social History Narrative   Lives with son   Right handed   Caffeine use: daily use   Social Determinants of Health   Financial Resource Strain: Not on file  Food Insecurity: No Food Insecurity (04/19/2022)   Hunger Vital Sign    Worried About Running Out of Food in the Last Year: Never true    Ran Out of Food in the Last Year: Never true  Transportation Needs: No Transportation Needs (04/19/2022)   PRAPARE - Hydrologist (Medical): No    Lack of Transportation (Non-Medical): No  Physical Activity: Not on file  Stress: Not on file  Social Connections: Not on file   Family History  Problem Relation Age of Onset   Congestive Heart Failure Mother    Cancer Father    Scheduled Meds:  amLODipine  5 mg Oral Daily   carbidopa-levodopa  0.5 tablet Oral TID   divalproex  250 mg Oral QHS   divalproex  500 mg Oral BID   enoxaparin (LOVENOX) injection  40 mg Subcutaneous Daily   [START ON 04/26/2022] levothyroxine  56 mcg Intravenous Daily   memantine  5 mg Oral QHS   OLANZapine  7.5 mg Oral BID   thiamine  100 mg Oral Daily   Continuous Infusions:  dextrose 5 % and 0.45% NaCl 75 mL/hr at 04/20/22 1442   PRN Meds:. Medications Prior to Admission:  Prior to Admission medications   Medication Sig Start Date End Date Taking? Authorizing Provider  acetaminophen (TYLENOL) 325 MG tablet Take 2 tablets (650 mg total) by mouth every 6 (six) hours as needed for mild pain (or Fever >/= 101). 07/04/21  Yes Nita Sells, MD  alum & mag hydroxide-simeth  (MI-ACID) 200-200-20 MG/5ML suspension Take 30 mLs by mouth every 6 (six) hours as needed for indigestion or heartburn.   Yes [provider]  amLODipine (NORVASC) 5 MG tablet Take 1 tablet (5 mg total) by mouth daily. 07/04/21  Yes Nita Sells, MD  carbidopa-levodopa (SINEMET IR) 25-100 MG tablet Take 0.5 tablets by mouth 3 (three) times daily. 07/10/21  Yes Nita Sells, MD  divalproex (DEPAKOTE) 250 MG DR tablet Take 1 tablet (250 mg total) by mouth at bedtime. 07/10/21  Yes Nita Sells, MD  divalproex (DEPAKOTE) 500 MG DR tablet Take 1 tablet (500 mg total) by mouth 2 (two) times  daily. 07/10/21  Yes Nita Sells, MD  guaifenesin (ROBITUSSIN) 100 MG/5ML syrup Take 200 mg by mouth 4 (four) times daily as needed for cough.   Yes [provider]  levothyroxine (SYNTHROID) 75 MCG tablet Take 75 mcg by mouth daily before breakfast. 06/15/21  Yes [provider]  loperamide (IMODIUM) 2 MG capsule Take 2 mg by mouth daily as needed for diarrhea or loose stools.   Yes [provider]  magnesium hydroxide (MILK OF MAGNESIA) 400 MG/5ML suspension Take 30 mLs by mouth at bedtime as needed for mild constipation.   Yes [provider]  memantine (NAMENDA) 5 MG tablet Take 1 tablet (5 mg total) by mouth at bedtime. 07/10/21  Yes Nita Sells, MD  Multiple Vitamin (MULTIVITAMIN WITH MINERALS) TABS tablet Take 1 tablet by mouth daily.   Yes [provider]  neomycin-bacitracin-polymyxin (NEOSPORIN) [phone] ointment Apply 1 Application topically daily as needed (wound care).   Yes [provider]  OLANZapine (ZYPREXA) 15 MG tablet Take 7.5 mg by mouth 2 (two) times daily. 02/14/22  Yes [provider]  propranolol (INDERAL) 10 MG tablet Take 1 tablet (10 mg total) by mouth 2 (two) times daily. 07/10/21  Yes Nita Sells, MD  thiamine 100 MG tablet Take 1 tablet (100 mg total) by mouth daily. 05/17/20  Yes  Geradine Girt, DO  lactobacillus acidophilus (BACID) TABS tablet Take 2 tablets by mouth 3 (three) times daily.    [provider]   No Known Allergies Review of Systems Does not verbalize Physical Exam Resting in bed Does not arouse or verbalize or follow commands Has bruises scattered from fall Regular work of breathing   Vital Signs: BP (!) 164/75 (BP Location: Right Arm)   Pulse (!) 51   Temp (!) 97.4 F (36.3 C) (Oral)   Resp 16   SpO2 100%  Pain Scale: PAINAD   Pain Score: 0-No pain   SpO2: SpO2: 100 % O2 Device:SpO2: 100 % O2 Flow Rate: .   IO: Intake/output summary:  Intake/Output Summary (Last 24 hours) at 04/21/2022 1525 Last data filed at 04/21/2022 0941 Gross per 24 hour  Intake 172.02 ml  Output 600 ml  Net -427.98 ml    LBM: Last BM Date : 04/21/22 Baseline Weight:   Most recent weight:       Palliative Assessment/Data:   PPS 30%.  Time In: 1518 Time Out: 1618 Time Total: 60 Greater than 50%  of this time was spent counseling and coordinating care related to the above assessment and plan.  Signed by: Loistine Chance, MD   Please contact Palliative Medicine Team phone at [phone] for questions and concerns.  For individual provider: See Shea Evans

## 2022-04-21 NOTE — Progress Notes (Signed)
CCMD called about the pt's HR below 40. The pt was asleep when RN checked; also, the pt had scheduled propranolol at 2200. The pt's HR is slower in the night compared to the daytime.

## 2022-04-22 DIAGNOSIS — Z515 Encounter for palliative care: Secondary | ICD-10-CM | POA: Diagnosis not present

## 2022-04-22 DIAGNOSIS — R55 Syncope and collapse: Secondary | ICD-10-CM | POA: Diagnosis not present

## 2022-04-22 DIAGNOSIS — R531 Weakness: Secondary | ICD-10-CM | POA: Diagnosis not present

## 2022-04-22 DIAGNOSIS — Z7189 Other specified counseling: Secondary | ICD-10-CM | POA: Diagnosis not present

## 2022-04-22 MED ORDER — DIVALPROEX SODIUM 125 MG PO CSDR
500.0000 mg | DELAYED_RELEASE_CAPSULE | Freq: Every day | ORAL | Status: DC
  Administered 2022-04-23 – 2022-05-07 (×15): 500 mg via ORAL
  Filled 2022-04-22 (×15): qty 4

## 2022-04-22 MED ORDER — LEVOTHYROXINE SODIUM 75 MCG PO TABS
75.0000 ug | ORAL_TABLET | Freq: Every day | ORAL | Status: DC
  Administered 2022-04-23 – 2022-05-07 (×14): 75 ug via ORAL
  Filled 2022-04-22 (×16): qty 1

## 2022-04-22 MED ORDER — DIVALPROEX SODIUM 125 MG PO CSDR
375.0000 mg | DELAYED_RELEASE_CAPSULE | ORAL | Status: DC
  Administered 2022-04-22 – 2022-05-07 (×27): 375 mg via ORAL
  Filled 2022-04-22 (×27): qty 3

## 2022-04-22 MED ORDER — AMLODIPINE BESYLATE 5 MG PO TABS: 2.5000 mg | ORAL_TABLET | Freq: Every day | ORAL | Status: DC

## 2022-04-22 NOTE — Progress Notes (Signed)
HR increased 200's (SVT) nonsustained x 1 minute.  04/22/22 1647  Vitals  BP 125/84  MAP (mmHg) 97  BP Location Right Arm  BP Method Automatic  Patient Position (if appropriate) Lying  Pulse Rate 87  Pulse Rate Source Monitor  Resp 20  MEWS COLOR  MEWS Score Color Green  Oxygen Therapy  SpO2 99 %  O2 Device Room Air  MEWS Score  MEWS Temp 0  MEWS Systolic 0  MEWS Pulse 0  MEWS RR 0  MEWS LOC 0  MEWS Score 0   Provider contacted with update.

## 2022-04-22 NOTE — Progress Notes (Signed)
SLP Cancellation Note  Patient Details Name: Trevor Cook MRN: [id number] DOB: 11/16/1945   Cancelled treatment:       Reason Eval/Treat Not Completed: Other (comment) (Per Palliative note, patient's family opting for comfort PO measures at this time. SLP in agreement as this was recommended by SLP who completed MBS on 04/20/22. SLP to s/o at this time.)   Angela Nevin, MA, CCC-SLP Speech Therapy

## 2022-04-22 NOTE — Progress Notes (Signed)
Daily Progress Note   Patient Name: Trevor Cook       Date: 04/22/2022 DOB: 12/10/1945  Age: 76 y.o. MRN#: [id number] Attending Physician: Lavina Hamman, MD Primary Care Physician: Tamsen Roers, MD Admit Date: 04/18/2022  Reason for Consultation/Follow-up: Establishing goals of care  Subjective: Resting in bed, nursing assistant colleague present in the room, attempting to set up the patient with the his lunch tray, she tells me that the patient ate about 90% of his breakfast tray.  Patient resting in bed does not open eyes or does not engage verbalize with me, family not at bedside about goals of care discussions were held on 04-21-2022 at the time of initial consultation.  Length of Stay: 3  Current Medications: Scheduled Meds:   [START ON 04/23/2022] amLODipine  2.5 mg Oral Daily   carbidopa-levodopa  0.5 tablet Oral TID   divalproex  250 mg Oral QHS   divalproex  500 mg Oral BID   enoxaparin (LOVENOX) injection  40 mg Subcutaneous Daily   [START ON 04/23/2022] levothyroxine  75 mcg Oral Q0600   OLANZapine  7.5 mg Oral BID   thiamine  100 mg Oral Daily    Continuous Infusions:   PRN Meds:   Physical Exam         Weak appearing gentleman Resting in bed Regular work of breathing Abdomen not distended Does not have edema Not agitated  Vital Signs: BP 117/70 (BP Location: Right Arm)   Pulse (!) 58   Temp 97.6 F (36.4 C) (Oral)   Resp 18   SpO2 100%  SpO2: SpO2: 100 % O2 Device: O2 Device: Room Air O2 Flow Rate:    Intake/output summary:  Intake/Output Summary (Last 24 hours) at 04/22/2022 1326 Last data filed at 04/22/2022 1209 Gross per 24 hour  Intake 3078.32 ml  Output 701 ml  Net 2377.32 ml   LBM: Last BM Date : 04/21/22 Baseline Weight:   Most  recent weight:         Palliative Assessment/Data:      Patient Active Problem List   Diagnosis Date Noted   Syncope 04/18/2022   Lethargy 06/21/2021   Wernicke's disease 06/20/2021   Dementia with behavioral disturbance ([place]) 06/20/2021   History of CVA (cerebrovascular accident) 06/20/2021   Aspiration into airway, initial encounter 06/18/2021  Macrocytic anemia 06/18/2021   Hyperglycemia 06/18/2021   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HCC) 06/18/2021   Acute metabolic encephalopathy 05/13/2020   Encephalopathy 05/13/2020   Toxic metabolic encephalopathy 05/13/2020   Drug-induced parkinsonism (HCC)    Fall    History of alcohol abuse 04/07/2020   Tremor 09/20/2013   Essential hypertension 09/20/2013   Hypothyroidism 09/20/2013   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  Admitted to hospital medicine service with syncope and found to be significantly confused drowsy unable to swallow safely.  Palliative following for monitoring hospital course and overall disease trajectory of illness so as to guide further decision making and goals of care discussions.  Recommendations/Plan: DNR/DNI Continue comfort feeds, with careful hand-assisted feeding Recommend addition of palliative services at patient's facility.  Goals of Care and Additional Recommendations: Limitations on Scope of Treatment: No Artificial Feeding  Code Status:    Code Status Orders  (From admission, onward)           Start     Ordered   04/19/22 0147  Do not attempt resuscitation (DNR)  Continuous       Question Answer Comment  If patient has no pulse and is not breathing Do Not Attempt Resuscitation   If patient has a pulse and/or is breathing: Medical Treatment Goals LIMITED ADDITIONAL INTERVENTIONS: Use  medication/IV fluids and cardiac monitoring as indicated; Do not use intubation or mechanical ventilation (DNI), also provide comfort medications.  Transfer to Progressive/Stepdown as indicated, avoid Intensive Care.   Consent: Discussion documented in EHR or advanced directives reviewed      04/19/22 0146           Code Status History     Date Active Date Inactive Code Status Order ID Comments User Context   06/18/2021 1021 07/11/2021 1627 DNR [id number]  Bobette Mo, MD ED   05/13/2020 [id number] 05/18/2020 0231 DNR [id number]  Jacques Navy, MD ED   05/13/2020 0130 05/13/2020 0204 Full Code [id number]  Norins, Rosalyn Gess, MD ED      Advance Directive Documentation    Flowsheet Row Most Recent Value  Type of Advance Directive Healthcare Power of Attorney  Pre-existing out of facility DNR order (yellow form or pink MOST form) --  "MOST" Form in Place? --       Prognosis:  Unable to determine  Discharge Planning: Recommend discharge back to the patient's facility with palliative services, [facility].  Care plan was discussed with the interdisciplinary team  Thank you for allowing the Palliative Medicine Team to assist in the care of this patient.  Low MDM     Greater than 50%  of this time was spent counseling and coordinating care related to the above assessment and plan.  Rosalin Hawking, MD  Please contact Palliative Medicine Team phone at [phone] for questions and concerns.

## 2022-04-22 NOTE — Progress Notes (Signed)
[facility] Progress Note Patient: Trevor Cook [id number] DOB: 1945-09-20 DOA: 04/18/2022  DOS: the patient was seen and examined on 04/22/2022  Brief hospital course: PMH of Parkinson's disease, bipolar disorder, CVA, hypothyroidism, dementia, Warnicke's syndrome.  Presented to the hospital with passing out event.  Currently significantly confused and drowsy.  Unable to swallow safely.  Palliative care was consulted.  Family's goal is to focus on comfort with plans to discharge back to memory care unit with palliative care services. Assessment and Plan: Syncope. Unwitnessed fall. Telemetry so far negative. CT head unremarkable. Echocardiogram shows preserved EF.  No valvular abnormalities. Patient does have some artifacts on telemetry which appears to be PSVT but this is actually parameters as QRS complex can be seen marching through this SVTs with the same rate when he does not have any SVT. Monitor for now.  Dementia with agitation. Bipolar 1 disorder. History of Parkinson's disease. Continue Sinemet. Unable to swallow safely. Family's goal is to focus on comfort.  Dysphagia. Unable to swallow safely. Underwent MBS. Currently NPO. Family's goal is to focus on comfort.  They understand the aspiration risk and would still be preferring to advance the diet. Will place the patient on dysphagia 2 diet.  Agitation. Currently in restraint. Will maintain the Posey bed but reduce the wrist restraint to mittens only for now. Patient has had history of agitation in the past. Has been on Depakote for the same.  Depakote level is adequate. Continue current regimen for Depakote. Discontinue Namenda as it can increase agitation. Continue Zyprexa 7.5 mg twice daily.  In the past patient has been on family gram 3 times daily dose which was helpful. Monitor for now.  Hypothyroidism. Continue Synthroid. Checks free T4 an TSH.  Subjective: Patient drowsy and minimally  interactive but was able to feed himself earlier in the day and was actually combative with the nursing staff later. No nausea no vomiting.  Physical Exam: General: Appear in mild distress; Cardiovascular: S1 and S2 Present, no Murmur, Respiratory: good respiratory effort, Bilateral Air entry present, CTA, no Crackles, no wheezes Abdomen: Bowel Sound present, Non tender  Extremities: no Pedal edema Neurology: alert and non verbal  Data Reviewed: I have Reviewed nursing notes, Vitals, and Lab results. Since last encounter, pertinent lab results x-ray chest unremarkable.  X-ray abdomen unremarkable.   .   Disposition: Status is: Inpatient Remains inpatient appropriate because: Gradually advancing the diet.  Stopping IV fluids.  Mentation still not safe enough for discharge.  enoxaparin (LOVENOX) injection 40 mg Start: 04/19/22 1000   Family Communication: No one at bedside.  . Level of care: Telemetry Continue telemetry for now. Vitals:   04/22/22 0449 04/22/22 1024 04/22/22 1218 04/22/22 1647  BP: (!) 141/97 125/61 117/70 125/84  Pulse: (!) 50 (!) 55 (!) 58 87  Resp: 16 18 18 20   Temp: 98.7 F (37.1 C) (!) 97.5 F (36.4 C) 97.6 F (36.4 C)   TempSrc:  Oral Oral   SpO2: 100% 99% 100% 99%     Author: , MD 04/22/2022 7:02 PM  Please look on [email or web address] to find out who is on call.

## 2022-04-23 DIAGNOSIS — R55 Syncope and collapse: Secondary | ICD-10-CM | POA: Diagnosis not present

## 2022-04-23 NOTE — Care Management Important Message (Signed)
Important Message  Patient Details  Name: Trevor Cook MRN: [id number] Date of Birth: [birth year]   Medicare Important Message Given:  Yes     Mardene Sayer 04/23/2022, 12:51 PM

## 2022-04-23 NOTE — Progress Notes (Signed)
[facility] Progress Note Patient: Trevor Cook [id number]:[id number] DOB: 26-Nov-1945 DOA: 04/18/2022  DOS: the patient was seen and examined on 04/23/2022  Brief hospital course: PMH of Parkinson's disease, bipolar disorder, CVA, hypothyroidism, dementia, Warnicke's syndrome.  Presented to the hospital with passing out event.  Currently significantly confused and drowsy.  Unable to swallow safely.  Palliative care was consulted.  Family's goal is to focus on comfort with plans to discharge back to memory care unit with palliative care services. Assessment and Plan: Syncope. Unwitnessed fall. Telemetry so far negative. CT head unremarkable. Echocardiogram shows preserved EF.  No valvular abnormalities. Patient does have some artifacts on telemetry which appears to be PSVT but this is actually tremors as QRS complex can be seen marching through this SVTs with the same rate when he does not have any SVT. Monitor for now.  Dementia with agitation. Bipolar 1 disorder. History of Parkinson's disease. Continue Sinemet. Unable to swallow safely. Family's goal is to focus on comfort.  Dysphagia. Unable to swallow safely. Underwent MBS. Currently NPO. Family's goal is to focus on comfort.  They understand the aspiration risk and would still be preferring to advance the diet. Will place the patient on dysphagia 2 diet.  Agitation. Currently in restraint. Still has a Posey bed.  But wrist restraints were removed. Has been on Depakote for the same.  Depakote level is adequate. Continue current regimen for Depakote. Discontinue Namenda as it can increase agitation. Continue Zyprexa 7.5 mg twice daily.  In the past patient has been on 5 mg 3 times daily dose which was helpful. Monitor for now.  As long as remains stable today attempting to move positive tomorrow.  Hypothyroidism. Continue Synthroid. Checks free T4 an TSH.  Goals of care conversation. Palliative care was  consulted. Currently palliative care's recommendation is to stabilize the patient and discharged to memory care with palliative care support. Ideally patient will benefit from being on hospice at memory care unit.  Will await further progression or clarity.  Subjective: Mentation and review.  Unable to tell me his name.  Unable to follow multiple commands.  Physical Exam: Clear to auscultation. Bowel sound present. Oral mucosa dry. Moving all extremities. Tremors seen.  Data Reviewed: I have Reviewed nursing notes, Vitals, and Lab results. I ordered CBC and BMP.  Disposition: Status is: Inpatient Remains inpatient appropriate because: Monitor for diet tolerance monitor for mentation stability unremarkable restraint.  enoxaparin (LOVENOX) injection 40 mg Start: 04/19/22 1000   Family Communication: No one at bedside.  . Level of care: Telemetry Continue telemetry for now Vitals:   04/23/22 0602 04/23/22 1033 04/23/22 1253 04/23/22 2046  BP: 120/65 (!) 155/62 (!) 125/55 (!) 138/59  Pulse: (!) 59 61 (!) 57 75  Resp: 20  20 17   Temp: 97.7 F (36.5 C)  97.7 F (36.5 C) 97.8 F (36.6 C)  TempSrc: Oral  Oral Oral  SpO2: 99%  99% 99%     Author: , MD 04/23/2022 8:58 PM  Please look on [email or web address] to find out who is on call.

## 2022-04-23 NOTE — TOC Initial Note (Signed)
Transition of Care [facility]) - Initial/Assessment Note    Patient Details  Name: Trevor Cook MRN: [id number] Date of Birth: 16-Oct-1945  [facility]) CM/SW Contact:    Lanier Clam, RN Phone Number: 04/23/2022, 4:00 PM  Clinical Narrative:  From [facility]-memory care-d/c plan to return. Current.y in restraints-must be 24hr free of retraints for return.                   Barriers to Discharge: Continued Medical Work up   Patient Goals and CMS Choice        Expected Discharge Plan and Services                                                Prior Living Arrangements/Services                       Activities of Daily Living Home Assistive Devices/Equipment: Eyeglasses ADL Screening (condition at time of admission) Patient's cognitive ability adequate to safely complete daily activities?: No Is the patient deaf or have difficulty hearing?: No Does the patient have difficulty seeing, even when wearing glasses/contacts?: No Does the patient have difficulty concentrating, remembering, or making decisions?: Yes Patient able to express need for assistance with ADLs?: No Does the patient have difficulty dressing or bathing?: Yes Independently performs ADLs?: No Communication: Dependent Is this a change from baseline?: Pre-admission baseline Dressing (OT): Dependent Is this a change from baseline?: Pre-admission baseline Grooming: Dependent Is this a change from baseline?: Pre-admission baseline Feeding: Needs assistance Is this a change from baseline?: Pre-admission baseline Bathing: Dependent Is this a change from baseline?: Pre-admission baseline Toileting: Dependent Is this a change from baseline?: Pre-admission baseline In/Out Bed: Needs assistance Is this a change from baseline?: Pre-admission baseline Walks in Home: Needs assistance Is this a change from baseline?: Pre-admission baseline Does the patient have difficulty walking  or climbing stairs?: No Weakness of Legs: None Weakness of Arms/Hands: None  Permission Sought/Granted                  Emotional Assessment              Admission diagnosis:  Syncope [R55] Contusion of face, initial encounter [S00.83XA] Syncope, unspecified syncope type [R55] Patient Active Problem List   Diagnosis Date Noted   Syncope 04/18/2022   Lethargy 06/21/2021   Wernicke's disease 06/20/2021   Dementia with behavioral disturbance (HCC) 06/20/2021   History of CVA (cerebrovascular accident) 06/20/2021   Aspiration into airway, initial encounter 06/18/2021   Macrocytic anemia 06/18/2021   Hyperglycemia 06/18/2021   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HCC) 06/18/2021   Acute metabolic encephalopathy 05/13/2020   Encephalopathy 05/13/2020   Toxic metabolic encephalopathy 05/13/2020   Drug-induced parkinsonism (HCC)    Fall    History of alcohol abuse 04/07/2020   Tremor 09/20/2013   Essential hypertension 09/20/2013   Hypothyroidism 09/20/2013   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

## 2022-04-24 DIAGNOSIS — S0083XA Contusion of other part of head, initial encounter: Secondary | ICD-10-CM | POA: Diagnosis not present

## 2022-04-24 DIAGNOSIS — R55 Syncope and collapse: Secondary | ICD-10-CM | POA: Diagnosis not present

## 2022-04-24 LAB — CBC
HCT: 36.2 % — ABNORMAL LOW (ref 39.0–52.0)
Hemoglobin: 12 g/dL — ABNORMAL LOW (ref 13.0–17.0)
MCH: 32.7 pg (ref 26.0–34.0)
MCHC: 33.1 g/dL (ref 30.0–36.0)
MCV: 98.6 fL (ref 80.0–100.0)
Platelets: 221 10*3/uL (ref 150–400)
RBC: 3.67 MIL/uL — ABNORMAL LOW (ref 4.22–5.81)
RDW: 12.6 % (ref 11.5–15.5)
WBC: 10.3 10*3/uL (ref 4.0–10.5)
nRBC: 0 % (ref 0.0–0.2)

## 2022-04-24 LAB — BASIC METABOLIC PANEL
Anion gap: 6 (ref 5–15)
BUN: 12 mg/dL (ref 8–23)
CO2: 29 mmol/L (ref 22–32)
Calcium: 8.9 mg/dL (ref 8.9–10.3)
Chloride: 104 mmol/L (ref 98–111)
Creatinine, Ser: 0.54 mg/dL — ABNORMAL LOW (ref 0.61–1.24)
GFR, Estimated: >60 mL/min (ref >60)
Glucose, Bld: 84 mg/dL (ref 70–99)
Potassium: 4.4 mmol/L (ref 3.5–5.1)
Sodium: 139 mmol/L (ref 135–145)

## 2022-04-24 LAB — MAGNESIUM: Magnesium: 2 mg/dL (ref 1.7–2.4)

## 2022-04-24 NOTE — Plan of Care (Signed)
  Problem: Education: Goal: Knowledge of General Education information will improve Description: Including pain rating scale, medication(s)/side effects and non-pharmacologic comfort measures Outcome: Not Progressing   

## 2022-04-24 NOTE — Progress Notes (Signed)
[facility]  PROGRESS NOTE  Trevor Cook [id number] DOB: Sep 20, 1945 DOA: 04/18/2022 PCP: Aida Puffer, MD   Brief HPI:   76 yr old male with pmh of Parkinson's disease, bipolar disorder, CVA, hypothyroidism, dementia, Warnicke's syndrome. Presented to the hospital with passing out event. Currently significantly confused and drowsy. Unable to swallow safely. Palliative care was consulted. Family's goal is to focus on comfort with plans to discharge back to memory care unit with palliative care services.     Subjective   Somnolent, barely opens eyes to sternal rub   Assessment/Plan:     Syncope. Unwitnessed fall. Telemetry so far negative. CT head unremarkable. Echocardiogram shows preserved EF.  No valvular abnormalities. Patient does have some artifacts on telemetry which appears to be PSVT but this is actually tremors as QRS complex can be seen marching through this SVTs with the same rate when he does not have any SVT. Monitor for now.   Dementia with agitation. Bipolar 1 disorder. History of Parkinson's disease. Continue Sinemet. Unable to swallow safely. Family's goal is to focus on comfort.   Dysphagia. Unable to swallow safely. Underwent MBS. Currently NPO. Family's goal is to focus on comfort.  They understand the aspiration risk and would still be preferring to advance the diet. -Patient is currently on dysphagia 2 diet   Agitation. Currently in restraint. Still has a Posey bed.  But wrist restraints were removed. Has been on Depakote for the same.  Depakote level is adequate. Continue current regimen for Depakote. Discontinue Namenda as it can increase agitation. Continue Zyprexa 7.5 mg twice daily.  In the past patient has been on 5 mg 3 times daily dose which was helpful. Monitor for now.     Hypothyroidism. Continue Synthroid. Checks free T4 an TSH.   Goals of care conversation. Palliative care was consulted. Currently palliative care's  recommendation is to stabilize the patient and discharged to memory care with palliative care support. Ideally patient will benefit from being on hospice at memory care unit.        Medications     carbidopa-levodopa  0.5 tablet Oral TID   divalproex  375 mg Oral 2 times per day   divalproex  500 mg Oral Daily   enoxaparin (LOVENOX) injection  40 mg Subcutaneous Daily   levothyroxine  75 mcg Oral Q0600   OLANZapine  7.5 mg Oral BID   thiamine  100 mg Oral Daily     Data Reviewed:   CBG:  No results for input(s): "GLUCAP" in the last 168 hours.  SpO2: 100 %    Vitals:   04/23/22 2046 04/24/22 0542 04/24/22 1241 04/24/22 1356  BP: (!) 138/59 131/76  133/71  Pulse: 75 (!) 55  61  Resp: 17 16  16   Temp: 97.8 F (36.6 C) (!) 97.5 F (36.4 C)  97.8 F (36.6 C)  TempSrc: Oral Oral    SpO2: 99% 99%  100%  Weight:   62.1 kg   Height:   5\' 7"  (1.702 m)       Data Reviewed:  Basic Metabolic Panel: Recent Labs  Lab 04/18/22 1629 04/19/22 0341 04/21/22 0555 04/24/22 0453  NA 139 143 138 139  K 4.0 3.8 3.7 4.4  CL 103 108 102 104  CO2 30 29 28 29   GLUCOSE 98 86 99 84  BUN 21 16 12 12   CREATININE 0.54* 0.52* 0.54* 0.54*  CALCIUM 9.3 9.7 9.4 8.9  MG  --  2.2 2.0 2.0  PHOS  --  3.2 3.0  --     CBC: Recent Labs  Lab 04/18/22 1629 04/19/22 0341 04/21/22 0555 04/24/22 0453  WBC 7.7 8.2 5.9 10.3  NEUTROABS  --   --  2.5  --   HGB 12.5* 12.1* 14.1 12.0*  HCT 37.0* 36.0* 41.9 36.2*  MCV 98.9 98.9 97.4 98.6  PLT 224 216 239 221    LFT Recent Labs  Lab 04/19/22 0341 04/21/22 0555  AST 17  --   ALT 22  --   ALKPHOS 48  --   BILITOT 0.7  --   PROT 6.3*  --   ALBUMIN 3.7 3.7     Antibiotics: Anti-infectives (From admission, onward)    None        DVT prophylaxis: Lovenox  Code Status: DNR  Family Communication: No family at bedside   CONSULTS palliative care   Objective    Physical Examination:   Lungs clear to  auscultation\ Patient is somnolent, arousable to sternal rub Oral mucosa is dry Abdomen is soft, nontender, no organomegaly  Status is: Inpatient:             Meredeth Ide   [facility] If 7PM-7AM, please contact night-coverage at [email or web address], Office  [phone]   04/24/2022, 6:20 PM  LOS: 5 days

## 2022-04-25 DIAGNOSIS — S0083XA Contusion of other part of head, initial encounter: Secondary | ICD-10-CM | POA: Diagnosis not present

## 2022-04-25 DIAGNOSIS — R55 Syncope and collapse: Secondary | ICD-10-CM | POA: Diagnosis not present

## 2022-04-25 NOTE — Progress Notes (Signed)
  Daily Progress Note   Patient Name: [name]       Date: 04/25/2022 DOB: 09-May-1945  Age: 76 y.o. MRN#: [id number] Attending Physician: Meredeth Ide, MD Primary Care Physician: Aida Puffer, MD Admit Date: 04/18/2022 Length of Stay: 6 days  Patient last seen by palliative provider Dr. Linna Darner on 04/22/22. At that time, goals for care were determined to be focusing on patient's comfort and returning to his memory care unit with palliative services there. PMT team has been following peripherally as goals for medical care at determined. Hospitalist and TOC working to coordinate discharge planning. Please reach out if any acute issus arise requiring PMT involvement. Thank you.    Alvester Morin, DO Palliative Care Provider PMT # [phone]

## 2022-04-25 NOTE — Progress Notes (Signed)
[facility]  PROGRESS NOTE  Trevor Cook [id number]:[id number] DOB: 1945-12-03 DOA: 04/18/2022 PCP: Aida Puffer, MD   Brief HPI:   76 yr old male with pmh of Parkinson's disease, bipolar disorder, CVA, hypothyroidism, dementia, Warnicke's syndrome. Presented to the hospital with passing out event. Currently significantly confused and drowsy. Unable to swallow safely. Palliative care was consulted. Family's goal is to focus on comfort with plans to discharge back to memory care unit with palliative care services.     Subjective   Patient seen and examined, alert but nonverbal.  Currently in restraints.   Assessment/Plan:     Syncope. Unwitnessed fall. Telemetry so far negative. CT head unremarkable. Echocardiogram shows preserved EF.  No valvular abnormalities. Patient does have some artifacts on telemetry which appears to be PSVT but this is actually tremors as QRS complex can be seen marching through this SVTs with the same rate when he does not have any SVT. Monitor for now.   Dementia with agitation. Bipolar 1 disorder. History of Parkinson's disease. Continue Sinemet. Unable to swallow safely. Family's goal is to focus on comfort.   Dysphagia. Unable to swallow safely. Underwent MBS. Currently NPO. Family's goal is to focus on comfort.  They understand the aspiration risk and would still be preferring to advance the diet. -Patient is currently on dysphagia 2 diet   Agitation. Currently in restraints. Still has a Posey belt.  But wrist restraints were removed. Has been on Depakote for the same.  Depakote level is adequate. Continue current regimen for Depakote. Discontinue Namenda as it can increase agitation. Continue Zyprexa 7.5 mg twice daily.  In the past patient has been on 5 mg 3 times daily dose which was helpful. Monitor for now.     Hypothyroidism. Continue Synthroid. Checks free T4 an TSH.   Goals of care conversation. Palliative care was  consulted. Currently palliative care's recommendation is to stabilize the patient and discharged to memory care with palliative care support. Ideally patient will benefit from being on hospice at memory care unit.        Medications     carbidopa-levodopa  0.5 tablet Oral TID   divalproex  375 mg Oral 2 times per day   divalproex  500 mg Oral Daily   enoxaparin (LOVENOX) injection  40 mg Subcutaneous Daily   levothyroxine  75 mcg Oral Q0600   OLANZapine  7.5 mg Oral BID   thiamine  100 mg Oral Daily     Data Reviewed:   CBG:  No results for input(s): "GLUCAP" in the last 168 hours.  SpO2: 100 %    Vitals:   04/24/22 1356 04/24/22 1945 04/25/22 0538 04/25/22 0538  BP: 133/71 (!) 151/104 (!) 173/73 (!) 173/73  Pulse: 61 (!) 59 (!) 50 (!) 50  Resp: 16 18 16 16   Temp: 97.8 F (36.6 C) (!) 97 F (36.1 C) (!) 97.5 F (36.4 C) (!) 97.5 F (36.4 C)  TempSrc:  Axillary Oral Oral  SpO2: 100% 100% 100% 100%  Weight:      Height:          Data Reviewed:  Basic Metabolic Panel: Recent Labs  Lab 04/18/22 1629 04/19/22 0341 04/21/22 0555 04/24/22 0453  NA 139 143 138 139  K 4.0 3.8 3.7 4.4  CL 103 108 102 104  CO2 30 29 28 29   GLUCOSE 98 86 99 84  BUN 21 16 12 12   CREATININE 0.54* 0.52* 0.54* 0.54*  CALCIUM 9.3 9.7 9.4 8.9  MG  --  2.2 2.0 2.0  PHOS  --  3.2 3.0  --     CBC: Recent Labs  Lab 04/18/22 1629 04/19/22 0341 04/21/22 0555 04/24/22 0453  WBC 7.7 8.2 5.9 10.3  NEUTROABS  --   --  2.5  --   HGB 12.5* 12.1* 14.1 12.0*  HCT 37.0* 36.0* 41.9 36.2*  MCV 98.9 98.9 97.4 98.6  PLT 224 216 239 221    LFT Recent Labs  Lab 04/19/22 0341 04/21/22 0555  AST 17  --   ALT 22  --   ALKPHOS 48  --   BILITOT 0.7  --   PROT 6.3*  --   ALBUMIN 3.7 3.7     Antibiotics: Anti-infectives (From admission, onward)    None        DVT prophylaxis: Lovenox  Code Status: DNR  Family Communication: No family at bedside   CONSULTS palliative  care   Objective    Physical Examination:  General-appears in no acute distress Heart S1-S2, regular Lungs clear to auscultation bilaterally Abdomen is soft, nontender, no organomegaly Neuro/psych-alert, nonverbal   Status is: Inpatient:             Meredeth Ide   [facility] If 7PM-7AM, please contact night-coverage at [email or web address], Office  [phone]   04/25/2022, 10:14 AM  LOS: 6 days

## 2022-04-26 DIAGNOSIS — R55 Syncope and collapse: Secondary | ICD-10-CM | POA: Diagnosis not present

## 2022-04-26 DIAGNOSIS — S0083XA Contusion of other part of head, initial encounter: Secondary | ICD-10-CM | POA: Diagnosis not present

## 2022-04-26 LAB — CREATININE, SERUM
Creatinine, Ser: 0.61 mg/dL (ref 0.61–1.24)
GFR, Estimated: >60 mL/min (ref >60)

## 2022-04-26 NOTE — Plan of Care (Signed)
  Problem: Safety: Goal: Non-violent Restraint(s) Outcome: Progressing   Problem: Education: Goal: Knowledge of General Education information will improve Description: Including pain rating scale, medication(s)/side effects and non-pharmacologic comfort measures Outcome: Progressing   Problem: Health Behavior/Discharge Planning: Goal: Ability to manage health-related needs will improve Outcome: Progressing   Problem: Clinical Measurements: Goal: Ability to maintain clinical measurements within normal limits will improve Outcome: Progressing   Problem: Nutrition: Goal: Adequate nutrition will be maintained Outcome: Progressing

## 2022-04-26 NOTE — Care Management Important Message (Signed)
Important Message  Patient Details IM Letter placed in room. Name: Trevor Cook MRN: [id number] Date of Birth: 1945-08-31   Medicare Important Message Given:  Yes     Caren Macadam 04/26/2022, 11:32 AM

## 2022-04-26 NOTE — Progress Notes (Signed)
[facility]  PROGRESS NOTE  Trevor Cook BLT:[id number] DOB: 12/22/1945 DOA: 04/18/2022 PCP: Aida Puffer, MD   Brief HPI:   76 yr old male with pmh of Parkinson's disease, bipolar disorder, CVA, hypothyroidism, dementia, Warnicke's syndrome. Presented to the hospital with passing out event. Currently significantly confused and drowsy. Unable to swallow safely. Palliative care was consulted. Family's goal is to focus on comfort with plans to discharge back to memory care unit with palliative care services.     Subjective   Patient continues to be in restraints.  Patient's son at bedside   Assessment/Plan:     Syncope. Unwitnessed fall. Telemetry so far negative. CT head unremarkable. Echocardiogram shows preserved EF.  No valvular abnormalities. Patient does have some artifacts on telemetry which appears to be PSVT but this is actually tremors as QRS complex can be seen marching through this SVTs with the same rate when he does not have any SVT. Monitor for now.   Dementia with agitation. Bipolar 1 disorder. History of Parkinson's disease. Continue Sinemet. -Patient is on dysphagia 1 diet    Dysphagia. Unable to swallow safely. Underwent MBS. Currently NPO. Family's goal is to focus on comfort.  They understand the aspiration risk and would still be preferring to advance the diet. -Patient is currently on dysphagia 1 diet   Agitation. Currently in restraints. Still has a Posey belt.  But wrist restraints were removed. Has been on Depakote for the same.  Depakote level is adequate. Continue current regimen for Depakote. Discontinue Namenda as it can increase agitation. Continue Zyprexa 7.5 mg twice daily.  In the past patient has been on 5 mg 3 times daily dose which was helpful. Monitor for now.     Hypothyroidism. Continue Synthroid. Checks free T4 an TSH.   Goals of care conversation. Palliative care was consulted. Currently palliative care's  recommendation is to stabilize the patient and discharged to memory care with palliative care support. Ideally patient will benefit from being on hospice at memory care unit.        Medications     carbidopa-levodopa  0.5 tablet Oral TID   divalproex  375 mg Oral 2 times per day   divalproex  500 mg Oral Daily   enoxaparin (LOVENOX) injection  40 mg Subcutaneous Daily   levothyroxine  75 mcg Oral Q0600   OLANZapine  7.5 mg Oral BID   thiamine  100 mg Oral Daily     Data Reviewed:   CBG:  No results for input(s): "GLUCAP" in the last 168 hours.  SpO2: 100 %    Vitals:   04/25/22 0538 04/25/22 1338 04/25/22 2039 04/26/22 0407  BP: (!) 173/73 (!) 155/94 132/88 131/80  Pulse: (!) 50 75 89 (!) 56  Resp: 16 (!) 22 18 18   Temp: (!) 97.5 F (36.4 C) (!) 97.4 F (36.3 C) 97.8 F (36.6 C) 97.8 F (36.6 C)  TempSrc: Oral  Axillary   SpO2: 100% 99% 98% 100%  Weight:      Height:          Data Reviewed:  Basic Metabolic Panel: Recent Labs  Lab 04/21/22 0555 04/24/22 0453 04/26/22 0444  NA 138 139  --   K 3.7 4.4  --   CL 102 104  --   CO2 28 29  --   GLUCOSE 99 84  --   BUN 12 12  --   CREATININE 0.54* 0.54* 0.61  CALCIUM 9.4 8.9  --   MG 2.0 2.0  --  PHOS 3.0  --   --     CBC: Recent Labs  Lab 04/21/22 0555 04/24/22 0453  WBC 5.9 10.3  NEUTROABS 2.5  --   HGB 14.1 12.0*  HCT 41.9 36.2*  MCV 97.4 98.6  PLT 239 221    LFT Recent Labs  Lab 04/21/22 0555  ALBUMIN 3.7     Antibiotics: Anti-infectives (From admission, onward)    None        DVT prophylaxis: Lovenox  Code Status: DNR  Family Communication: Discussed with patient and son at bedside   CONSULTS palliative care   Objective    Physical Examination:  Appears in no acute distress Heart S1-S2, regular Lungs clear to auscultation bilaterally Extremities no edema Neuro: Alert, confused   Status is: Inpatient:             Meredeth Ide   [facility] If 7PM-7AM, please contact night-coverage at [email or web address], Office  [phone]   04/26/2022, 10:25 AM  LOS: 7 days

## 2022-04-27 DIAGNOSIS — R55 Syncope and collapse: Secondary | ICD-10-CM | POA: Diagnosis not present

## 2022-04-27 DIAGNOSIS — S0083XA Contusion of other part of head, initial encounter: Secondary | ICD-10-CM | POA: Diagnosis not present

## 2022-04-27 NOTE — Plan of Care (Signed)
  Problem: Safety: Goal: Non-violent Restraint(s) Outcome: Progressing   Problem: Health Behavior/Discharge Planning: Goal: Ability to manage health-related needs will improve Outcome: Progressing   Problem: Nutrition: Goal: Adequate nutrition will be maintained Outcome: Adequate for Discharge   Problem: Elimination: Goal: Will not experience complications related to urinary retention Outcome: Adequate for Discharge   Problem: Pain Managment: Goal: General experience of comfort will improve Outcome: Adequate for Discharge

## 2022-04-27 NOTE — Plan of Care (Signed)
  Problem: Safety: Goal: Non-violent Restraint(s) Outcome: Progressing   Problem: Education: Goal: Knowledge of General Education information will improve Description: Including pain rating scale, medication(s)/side effects and non-pharmacologic comfort measures Outcome: Progressing   Problem: Health Behavior/Discharge Planning: Goal: Ability to manage health-related needs will improve Outcome: Progressing   Problem: Clinical Measurements: Goal: Ability to maintain clinical measurements within normal limits will improve Outcome: Progressing Goal: Will remain free from infection Outcome: Progressing Goal: Diagnostic test results will improve Outcome: Progressing Goal: Respiratory complications will improve Outcome: Progressing Goal: Cardiovascular complication will be avoided Outcome: Progressing   Problem: Activity: Goal: Risk for activity intolerance will decrease Outcome: Progressing   Problem: Nutrition: Goal: Adequate nutrition will be maintained Outcome: Progressing   Problem: Coping: Goal: Level of anxiety will decrease Outcome: Progressing   Problem: Pain Managment: Goal: General experience of comfort will improve Outcome: Progressing   Problem: Skin Integrity: Goal: Risk for impaired skin integrity will decrease Outcome: Progressing   Problem: Education: Goal: Knowledge of General Education information will improve Description: Including pain rating scale, medication(s)/side effects and non-pharmacologic comfort measures Outcome: Progressing   Problem: Clinical Measurements: Goal: Ability to maintain clinical measurements within normal limits will improve Outcome: Progressing Goal: Will remain free from infection Outcome: Progressing Goal: Diagnostic test results will improve Outcome: Progressing Goal: Respiratory complications will improve Outcome: Progressing Goal: Cardiovascular complication will be avoided Outcome: Progressing   Problem:  Activity: Goal: Risk for activity intolerance will decrease Outcome: Progressing   Problem: Coping: Goal: Level of anxiety will decrease Outcome: Progressing   Problem: Elimination: Goal: Will not experience complications related to bowel motility Outcome: Progressing Goal: Will not experience complications related to urinary retention Outcome: Progressing   Problem: Pain Managment: Goal: General experience of comfort will improve Outcome: Progressing   Problem: Skin Integrity: Goal: Risk for impaired skin integrity will decrease Outcome: Progressing

## 2022-04-27 NOTE — Progress Notes (Signed)
[facility]  PROGRESS NOTE  Annette Liotta EYE:[id number] DOB: 03-19-1946 DOA: 04/18/2022 PCP: Aida Puffer, MD   Brief HPI:   76 yr old male with pmh of Parkinson's disease, bipolar disorder, CVA, hypothyroidism, dementia, Warnicke's syndrome. Presented to the hospital with passing out event. Currently significantly confused and drowsy. Unable to swallow safely. Palliative care was consulted. Family's goal is to focus on comfort with plans to discharge back to memory care unit with palliative care services.     Subjective   Patient somnolent, has been in restraints for past 3 days.   Assessment/Plan:     Syncope. Unwitnessed fall. Telemetry so far negative. CT head unremarkable. Echocardiogram shows preserved EF.  No valvular abnormalities. Patient does have some artifacts on telemetry which appears to be PSVT but this is actually tremors as QRS complex can be seen marching through this SVTs with the same rate when he does not have any SVT. Monitor for now.   Dementia with agitation. Bipolar 1 disorder. History of Parkinson's disease. Continue Sinemet. -Patient is on dysphagia 1 diet    Dysphagia. Unable to swallow safely. Underwent MBS. Currently NPO. Family's goal is to focus on comfort.  They understand the aspiration risk and would still be preferring to advance the diet. -Patient is currently on dysphagia 1 diet   Agitation. Currently in restraints. Still has a Posey belt.  But wrist restraints were removed. -Will remove restraints today and start telemonitoring for safety; Has been on Depakote for the same.  Depakote level is adequate. Continue current regimen for Depakote. Discontinue Namenda as it can increase agitation. Continue Zyprexa 7.5 mg twice daily.  In the past patient has been on 5 mg 3 times daily dose which was helpful. Monitor for now.   -He cannot go back to skilled nursing facility if he continues to be on restraints.  Will start  weaning off restraints, start telemonitoring as above.   Hypothyroidism. Continue Synthroid. Checks free T4 an TSH.   Goals of care conversation. Palliative care was consulted. Currently palliative care's recommendation is to stabilize the patient and discharged to memory care with palliative care support. Ideally patient will benefit from being on hospice at memory care unit.        Medications     carbidopa-levodopa  0.5 tablet Oral TID   divalproex  375 mg Oral 2 times per day   divalproex  500 mg Oral Daily   enoxaparin (LOVENOX) injection  40 mg Subcutaneous Daily   levothyroxine  75 mcg Oral Q0600   OLANZapine  7.5 mg Oral BID   thiamine  100 mg Oral Daily     Data Reviewed:   CBG:  No results for input(s): "GLUCAP" in the last 168 hours.  SpO2: 96 %    Vitals:   04/26/22 0407 04/26/22 1433 04/26/22 2111 04/27/22 0602  BP: 131/80 136/76 (!) 145/86 (!) 108/49  Pulse: (!) 56 67 74 63  Resp: 18  18 16   Temp: 97.8 F (36.6 C) 98.1 F (36.7 C) 98 F (36.7 C) 98.1 F (36.7 C)  TempSrc:  Oral Oral Oral  SpO2: 100% 100% 96% 96%  Weight:      Height:          Data Reviewed:  Basic Metabolic Panel: Recent Labs  Lab 04/21/22 0555 04/24/22 0453 04/26/22 0444  NA 138 139  --   K 3.7 4.4  --   CL 102 104  --   CO2 28 29  --  GLUCOSE 99 84  --   BUN 12 12  --   CREATININE 0.54* 0.54* 0.61  CALCIUM 9.4 8.9  --   MG 2.0 2.0  --   PHOS 3.0  --   --     CBC: Recent Labs  Lab 04/21/22 0555 04/24/22 0453  WBC 5.9 10.3  NEUTROABS 2.5  --   HGB 14.1 12.0*  HCT 41.9 36.2*  MCV 97.4 98.6  PLT 239 221    LFT Recent Labs  Lab 04/21/22 0555  ALBUMIN 3.7     Antibiotics: Anti-infectives (From admission, onward)    None        DVT prophylaxis: Lovenox  Code Status: DNR  Family Communication: Discussed with patient and son at bedside   CONSULTS palliative care   Objective    Physical Examination:  Appears in no acute  distress S1-S2, regular, no murmur auscultated Clear to auscultation bilaterally Somnolent but arousable to sternal rub   Status is: Inpatient:             Meredeth Ide   [facility] If 7PM-7AM, please contact night-coverage at [email or web address], Office  [phone]   04/27/2022, 10:51 AM  LOS: 8 days

## 2022-04-28 DIAGNOSIS — R55 Syncope and collapse: Secondary | ICD-10-CM | POA: Diagnosis not present

## 2022-04-28 DIAGNOSIS — S0083XA Contusion of other part of head, initial encounter: Secondary | ICD-10-CM | POA: Diagnosis not present

## 2022-04-28 MED ORDER — ORAL CARE MOUTH RINSE
15.0000 mL | OROMUCOSAL | Status: DC
  Administered 2022-04-29 – 2022-05-06 (×19): 15 mL via OROMUCOSAL

## 2022-04-28 MED ORDER — BISACODYL 10 MG RE SUPP
10.0000 mg | Freq: Every day | RECTAL | Status: DC | PRN
  Administered 2022-05-05: 10 mg via RECTAL
  Filled 2022-04-28: qty 1

## 2022-04-28 MED ORDER — ORAL CARE MOUTH RINSE: 15.0000 mL | OROMUCOSAL | Status: DC | PRN

## 2022-04-28 MED ORDER — POLYETHYLENE GLYCOL 3350 17 G PO PACK
17.0000 g | PACK | Freq: Every day | ORAL | Status: DC
  Administered 2022-04-28 – 2022-05-07 (×9): 17 g via ORAL
  Filled 2022-04-28 (×10): qty 1

## 2022-04-28 NOTE — Progress Notes (Signed)
[facility]  PROGRESS NOTE  Trevor Cook ZRA:[id number] DOB: 09-26-1945 DOA: 04/18/2022 PCP: Aida Puffer, MD   Brief HPI:   76 yr old male with pmh of Parkinson's disease, bipolar disorder, CVA, hypothyroidism, dementia, Warnicke's syndrome. Presented to the hospital with passing out event. Currently significantly confused and drowsy. Unable to swallow safely. Palliative care was consulted. Family's goal is to focus on comfort with plans to discharge back to memory care unit with palliative care services.     Subjective   Has been off restraints, currently under telemonitoring   Assessment/Plan:   Syncope. Unwitnessed fall. Telemetry so far negative. CT head unremarkable. Echocardiogram shows preserved EF.  No valvular abnormalities. Patient does have some artifacts on telemetry which appears to be PSVT but this is actually tremors as QRS complex can be seen marching through this SVTs with the same rate when he does not have any SVT. Monitor for now.   Dementia with agitation. Bipolar 1 disorder. History of Parkinson's disease. Continue Sinemet. -Patient is on dysphagia 1 diet   Dysphagia. Unable to swallow safely. Underwent MBS. Currently NPO. Family's goal is to focus on comfort.  They understand the aspiration risk and would still be preferring to advance the diet. -Patient is currently on dysphagia 1 diet   Agitation. Currently in restraints. Still has a Posey belt.  But wrist restraints were removed. -Will remove restraints today and start telemonitoring for safety; Has been on Depakote for the same.  Depakote level is adequate. Continue current regimen for Depakote. Discontinue Namenda as it can increase agitation. Continue Zyprexa 7.5 mg twice daily.  In the past patient has been on 5 mg 3 times daily dose which was helpful. Monitor for now.   -He cannot go back to skilled nursing facility if he continues to be on restraints.   - Has been off  restraints for past 24 hours  -continue telemonitoring    Hypothyroidism. Continue Synthroid. Checks free T4 an TSH.   Goals of care conversation. Palliative care was consulted. Currently palliative care's recommendation is to stabilize the patient and discharged to memory care with palliative care support. Ideally patient will benefit from being on hospice at memory care unit.        Medications     carbidopa-levodopa  0.5 tablet Oral TID   divalproex  375 mg Oral 2 times per day   divalproex  500 mg Oral Daily   enoxaparin (LOVENOX) injection  40 mg Subcutaneous Daily   levothyroxine  75 mcg Oral Q0600   OLANZapine  7.5 mg Oral BID   mouth rinse  15 mL Mouth Rinse 4 times per day   thiamine  100 mg Oral Daily     Data Reviewed:   CBG:  No results for input(s): "GLUCAP" in the last 168 hours.  SpO2: 97 % FiO2 (%): 21 %    Vitals:   04/27/22 1342 04/27/22 2027 04/28/22 0444 04/28/22 1454  BP: 116/63 126/74 (!) 148/68 139/74  Pulse: 66 73 66 77  Resp: 19 20  20   Temp: 98 F (36.7 C) 98.1 F (36.7 C) 98.6 F (37 C) 98.2 F (36.8 C)  TempSrc: Oral Oral Oral Oral  SpO2: 94% 95% 92% 97%  Weight:      Height:          Data Reviewed:  Basic Metabolic Panel: Recent Labs  Lab 04/24/22 0453 04/26/22 0444  NA 139  --   K 4.4  --   CL 104  --  CO2 29  --   GLUCOSE 84  --   BUN 12  --   CREATININE 0.54* 0.61  CALCIUM 8.9  --   MG 2.0  --     CBC: Recent Labs  Lab 04/24/22 0453  WBC 10.3  HGB 12.0*  HCT 36.2*  MCV 98.6  PLT 221    LFT No results for input(s): "AST", "ALT", "ALKPHOS", "BILITOT", "PROT", "ALBUMIN" in the last 168 hours.    Antibiotics: Anti-infectives (From admission, onward)    None        DVT prophylaxis: Lovenox  Code Status: DNR  Family Communication: Discussed with patient and son at bedside   CONSULTS palliative care   Objective    Physical Examination:  Appears in no acute distress S1-S2,  regular, no murmur auscultated Clear to auscultation bilaterally Alert, nonverbal  Status is: Inpatient:        Meredeth Ide   [facility] If 7PM-7AM, please contact night-coverage at [email or web address], Office  [phone]   04/28/2022, 3:01 PM  LOS: 9 days

## 2022-04-29 DIAGNOSIS — R55 Syncope and collapse: Secondary | ICD-10-CM | POA: Diagnosis not present

## 2022-04-29 DIAGNOSIS — S0083XA Contusion of other part of head, initial encounter: Secondary | ICD-10-CM | POA: Diagnosis not present

## 2022-04-29 NOTE — Evaluation (Signed)
Physical Therapy Evaluation Patient Details Name: Trevor Cook MRN: [id number] DOB: [birth year] Today's Date: 04/29/2022  History of Present Illness  Pt admitted from memory care unit following syncope and fall.  Pt with hx of bipolar, cerebellar disorder, epilepsy, Wernicke's-Korsakoff syndrome, CVA, Parkinsons, and alchohol-induced persisting dementia  Clinical Impression  Pt admitted as above and presenting with functional mobility limitations 2* balance deficits and dementia related cognitive deficits.  This date, pt following most simple cues related to mobility and up to ambulate 260' in hall with min HHA for balance/safety.  Pt should progress to return to prior memory care setting.     Recommendations for follow up therapy are one component of a multi-disciplinary discharge planning process, led by the attending physician.  Recommendations may be updated based on patient status, additional functional criteria and insurance authorization.  Follow Up Recommendations No PT follow up      Assistance Recommended at Discharge Frequent or constant Supervision/Assistance  Patient can return home with the following  A little help with walking and/or transfers;A lot of help with bathing/dressing/bathroom;Assistance with cooking/housework;Assist for transportation;Help with stairs or ramp for entrance    Equipment Recommendations None recommended by PT  Recommendations for Other Services       Functional Status Assessment Patient has had a recent decline in their functional status and demonstrates the ability to make significant improvements in function in a reasonable and predictable amount of time.     Precautions / Restrictions Precautions Precautions: Fall Restrictions Weight Bearing Restrictions: No      Mobility  Bed Mobility Overal bed mobility: Needs Assistance Bed Mobility: Supine to Sit, Sit to Supine     Supine to sit: Supervision Sit to supine: Supervision    General bed mobility comments: Increased time with supervision for safety only    Transfers Overall transfer level: Needs assistance Equipment used: 1 person hand held assist Transfers: Sit to/from Stand Sit to Stand: Min assist           General transfer comment: Steady assist    Ambulation/Gait Ambulation/Gait assistance: Min assist Gait Distance (Feet): 260 Feet Assistive device: 1 person hand held assist Gait Pattern/deviations: Step-through pattern, Decreased step length - right, Decreased step length - left, Shuffle, Trunk flexed, Wide base of support       General Gait Details: Generalized instability with minor losses of balance but easily corrected with min assist  Stairs            Wheelchair Mobility    Modified Rankin (Stroke Patients Only)       Balance Overall balance assessment: Needs assistance Sitting-balance support: No upper extremity supported, Feet supported Sitting balance-Leahy Scale: Good     Standing balance support: Single extremity supported Standing balance-Leahy Scale: Fair                               Pertinent Vitals/Pain Pain Assessment Pain Assessment: Faces Faces Pain Scale: No hurt    Home Living Family/patient expects to be discharged to:: Skilled nursing facility                   Additional Comments: Per chart, pt does not use AD for mobility. Difficult to determine/confirm PLOF/home environment due to baseline cognitive deficits.    Prior Function Prior Level of Function : Patient poor historian/Family not available  Hand Dominance        Extremity/Trunk Assessment   Upper Extremity Assessment Upper Extremity Assessment: Overall WFL for tasks assessed    Lower Extremity Assessment Lower Extremity Assessment: Overall WFL for tasks assessed       Communication   Communication: Expressive difficulties (No intelligible speech noted during session)   Cognition Arousal/Alertness: Awake/alert Behavior During Therapy: Flat affect Overall Cognitive Status: History of cognitive impairments - at baseline                                          General Comments      Exercises     Assessment/Plan    PT Assessment Patient needs continued PT services  PT Problem List Decreased activity tolerance;Decreased balance;Decreased mobility;Decreased cognition       PT Treatment Interventions DME instruction;Gait training;Functional mobility training;Therapeutic activities;Therapeutic exercise;Patient/family education;Balance training    PT Goals (Current goals can be found in the Care Plan section)  Acute Rehab PT Goals Patient Stated Goal: No goals stated PT Goal Formulation: With patient Time For Goal Achievement: 05/13/22 Potential to Achieve Goals: Fair    Frequency Min 3X/week     Co-evaluation               AM-PAC PT "6 Clicks" Mobility  Outcome Measure Help needed turning from your back to your side while in a flat bed without using bedrails?: None Help needed moving from lying on your back to sitting on the side of a flat bed without using bedrails?: None Help needed moving to and from a bed to a chair (including a wheelchair)?: A Little Help needed standing up from a chair using your arms (e.g., wheelchair or bedside chair)?: A Little Help needed to walk in hospital room?: A Little Help needed climbing 3-5 steps with a railing? : A Little 6 Click Score: 20    End of Session Equipment Utilized During Treatment: Gait belt Activity Tolerance: Patient tolerated treatment well Patient left: in bed;with call bell/phone within reach;with bed alarm set;with restraints reapplied Nurse Communication: Mobility status PT Visit Diagnosis: Unsteadiness on feet (R26.81);Muscle weakness (generalized) (M62.81);History of falling (Z91.81)    Time: 2841-3244 PT Time Calculation (min) (ACUTE ONLY): 18  min   Charges:   PT Evaluation $PT Eval Low Complexity: 1 Low          Mauro Kaufmann PT Acute Rehabilitation Services Pager [phone] Office [phone]   [name] 04/29/2022, 2:52 PM

## 2022-04-29 NOTE — Progress Notes (Signed)
[facility]  PROGRESS NOTE  Trevor Cook QQP:[id number] DOB: [birth year] DOA: 04/18/2022 PCP: Aida Puffer, MD   Brief HPI:   76 yr old male with pmh of Parkinson's disease, bipolar disorder, CVA, hypothyroidism, dementia, Warnicke's syndrome. Presented to the hospital with passing out event. Currently significantly confused and drowsy. Unable to swallow safely. Palliative care was consulted. Family's goal is to focus on comfort with plans to discharge back to memory care unit with palliative care services.     Subjective   Patient seen and examined, no new complaints.   Assessment/Plan:   Syncope. Unwitnessed fall. Telemetry so far negative. CT head unremarkable. Echocardiogram shows preserved EF.  No valvular abnormalities. Patient does have some artifacts on telemetry which appears to be PSVT but this is actually tremors as QRS complex can be seen marching through this SVTs with the same rate when he does not have any SVT. Monitor for now.   Dementia with agitation. Bipolar 1 disorder. History of Parkinson's disease. Continue Sinemet. -Patient is on dysphagia 1 diet  Dysphagia. Unable to swallow safely. Underwent MBS. Currently NPO. Family's goal is to focus on comfort.  They understand the aspiration risk and would still be preferring to advance the diet. -Patient is currently on dysphagia 1 diet   Agitation. Currently in restraints. Still has a Posey belt.  But wrist restraints were removed. -Will remove restraints today and start telemonitoring for safety; Has been on Depakote for the same.  Depakote level is adequate. Continue current regimen for Depakote. Discontinue Namenda as it can increase agitation. Continue Zyprexa 7.5 mg twice daily.  In the past patient has been on 5 mg 3 times daily dose which was helpful. Monitor for now.   -He cannot go back to skilled nursing facility if he continues to be on restraints.   -Patient has been again put on  restraints  -continue telemonitoring    Hypothyroidism. Continue Synthroid. Checks free T4 an TSH.   Goals of care conversation. Palliative care was consulted. Currently palliative care's recommendation is to stabilize the patient and discharged to memory care with palliative care support. Ideally patient will benefit from being on hospice at memory care unit.        Medications     carbidopa-levodopa  0.5 tablet Oral TID   divalproex  375 mg Oral 2 times per day   divalproex  500 mg Oral Daily   enoxaparin (LOVENOX) injection  40 mg Subcutaneous Daily   levothyroxine  75 mcg Oral Q0600   OLANZapine  7.5 mg Oral BID   mouth rinse  15 mL Mouth Rinse 4 times per day   polyethylene glycol  17 g Oral Daily   thiamine  100 mg Oral Daily     Data Reviewed:   CBG:  No results for input(s): "GLUCAP" in the last 168 hours.  SpO2: 92 % FiO2 (%): 21 %    Vitals:   04/28/22 1454 04/28/22 2030 04/29/22 0527 04/29/22 1116  BP: 139/74 136/75 (!) 141/69 (!) 122/91  Pulse: 77 85 64 83  Resp: 20 (!) 22 20 19   Temp: 98.2 F (36.8 C) 98.3 F (36.8 C) 97.9 F (36.6 C) 97.9 F (36.6 C)  TempSrc: Oral Oral Oral Oral  SpO2: 97% 97% 92% 92%  Weight:      Height:          Data Reviewed:  Basic Metabolic Panel: Recent Labs  Lab 04/24/22 0453 04/26/22 0444  NA 139  --   K 4.4  --  CL 104  --   CO2 29  --   GLUCOSE 84  --   BUN 12  --   CREATININE 0.54* 0.61  CALCIUM 8.9  --   MG 2.0  --     CBC: Recent Labs  Lab 04/24/22 0453  WBC 10.3  HGB 12.0*  HCT 36.2*  MCV 98.6  PLT 221    LFT No results for input(s): "AST", "ALT", "ALKPHOS", "BILITOT", "PROT", "ALBUMIN" in the last 168 hours.    Antibiotics: Anti-infectives (From admission, onward)    None        DVT prophylaxis: Lovenox  Code Status: DNR  Family Communication: Discussed with patient and son at bedside   CONSULTS palliative care   Objective    Physical Examination:  Appears  in no acute distress S1-S2, regular Clear to auscultation bilaterally Neuro, alert, nonverbal  Status is: Inpatient:        Meredeth Ide   [facility] If 7PM-7AM, please contact night-coverage at [email or web address], Office  [phone]   04/29/2022, 3:00 PM  LOS: 10 days

## 2022-04-30 DIAGNOSIS — S0083XA Contusion of other part of head, initial encounter: Secondary | ICD-10-CM | POA: Diagnosis not present

## 2022-04-30 DIAGNOSIS — R55 Syncope and collapse: Secondary | ICD-10-CM | POA: Diagnosis not present

## 2022-04-30 MED ORDER — OLANZAPINE 10 MG IM SOLR
2.5000 mg | Freq: Once | INTRAMUSCULAR | Status: AC | PRN
  Administered 2022-04-30: 2.5 mg via INTRAMUSCULAR
  Filled 2022-04-30: qty 10

## 2022-04-30 NOTE — TOC Progression Note (Signed)
Transition of Care [facility]) - Progression Note    Patient Details  Name: Trevor Cook MRN: [id number] Date of Birth: [birth year]  [facility]) CM/SW Contact  Tollie Canada, Olegario Messier, RN Phone Number: 04/30/2022, 3:43 PM  Clinical Narrative:Patient out of restraints;faxed fl2 to [facility] rep crystal-she will come onsite tomorrow to review if patient can be accepted back PTA patient independent w/ambulation currently per notes-max asst.Faxed to [phone].      Expected Discharge Plan: Memory Care Barriers to Discharge: Continued Medical Work up  Expected Discharge Plan and Services                                               Social Determinants of Health (SDOH) Interventions SDOH Screenings   Food Insecurity: No Food Insecurity (04/19/2022)  Housing: Low Risk  (04/19/2022)  Transportation Needs: No Transportation Needs (04/19/2022)  Utilities: Not At Risk (04/19/2022)  Tobacco Use: Medium Risk (04/18/2022)    Readmission Risk Interventions     No data to display

## 2022-04-30 NOTE — Progress Notes (Signed)
Mobility Specialist - Progress Note   04/30/22 1100  Mobility  Activity Ambulated with assistance to bathroom  Level of Assistance Maximum assist, patient does 25-49%  Assistive Device Other (Comment) (HHA)  Distance Ambulated (ft) 20 ft  Range of Motion/Exercises Active  Activity Response Tolerated well  Mobility Referral Yes  $Mobility charge 1 Mobility   Pt was found in bed. Pt was max-A from lying to sitting and mod-A from sitting to standing. Pt was able to ambulate to bathroom with HHA and cues. Pt was drooling throughout session and became fatigued by EOS. Was left in bed with precautions on and NT notified.   Billey Chang Mobility Specialist

## 2022-04-30 NOTE — Progress Notes (Addendum)
[facility]  PROGRESS NOTE  Trevor Cook NFA:[id number] DOB: 1945-05-26 DOA: 04/18/2022 PCP: Aida Puffer, MD   Brief HPI:   76 yr old male with pmh of Parkinson's disease, bipolar disorder, CVA, hypothyroidism, dementia, Warnicke's syndrome. Presented to the hospital with passing out event. Currently significantly confused and drowsy. Unable to swallow safely. Palliative care was consulted. Family's goal is to focus on comfort with plans to discharge back to memory care unit with palliative care services.     Subjective   Patient seen and examined, continues to be confused.   Assessment/Plan:   Syncope. Unwitnessed fall. Telemetry so far negative. CT head unremarkable. Echocardiogram shows preserved EF.  No valvular abnormalities. Patient does have some artifacts on telemetry which appears to be PSVT but this is actually tremors as QRS complex can be seen marching through this SVTs with the same rate when he does not have any SVT. Monitor for now. -Patient's blood pressure has been stable in the hospital -There is concern that patient may have passed out because of orthostatic hypotension -Will discontinue propranolol and amlodipine; will not restart these medications on discharge   Dementia with agitation. Bipolar 1 disorder. History of Parkinson's disease. Continue Sinemet. -Patient is on dysphagia 1 diet  Dysphagia. Unable to swallow safely. Underwent MBS. Currently NPO. Family's goal is to focus on comfort.  They understand the aspiration risk and would still be preferring to advance the diet. -Patient is currently on dysphagia 1 diet   Agitation. Currently in restraints. Still has a Posey belt.  But wrist restraints were removed. -Will remove restraints today and start telemonitoring for safety; Has been on Depakote for the same.  Depakote level is adequate. Continue current regimen for Depakote. Discontinue Namenda as it can increase agitation. Continue  Zyprexa 7.5 mg twice daily.  In the past patient has been on 5 mg 3 times daily dose which was helpful. Monitor for now.   -He cannot go back to skilled nursing facility if he continues to be on restraints.   -Patient is not on restraints -continue telemonitoring  -Social worker will check whether facility can take him back tomorrow   Hypothyroidism. Continue Synthroid. Checks free T4 an TSH.   Goals of care conversation. Palliative care was consulted. Currently palliative care's recommendation is to stabilize the patient and discharged to memory care with palliative care support. Ideally patient will benefit from being on hospice at memory care unit.        Medications     carbidopa-levodopa  0.5 tablet Oral TID   divalproex  375 mg Oral 2 times per day   divalproex  500 mg Oral Daily   enoxaparin (LOVENOX) injection  40 mg Subcutaneous Daily   levothyroxine  75 mcg Oral Q0600   OLANZapine  7.5 mg Oral BID   mouth rinse  15 mL Mouth Rinse 4 times per day   polyethylene glycol  17 g Oral Daily   thiamine  100 mg Oral Daily     Data Reviewed:   CBG:  No results for input(s): "GLUCAP" in the last 168 hours.  SpO2: 97 % FiO2 (%): 21 %    Vitals:   04/29/22 0527 04/29/22 1116 04/30/22 0621 04/30/22 1248  BP: (!) 141/69 (!) 122/91 (!) 145/87 120/68  Pulse: 64 83 (!) 51 (!) 57  Resp: 20 19 16 18   Temp: 97.9 F (36.6 C) 97.9 F (36.6 C) (!) 97.5 F (36.4 C) 97.9 F (36.6 C)  TempSrc: Oral Oral Oral Oral  SpO2: 92% 92% 97%   Weight:      Height:          Data Reviewed:  Basic Metabolic Panel: Recent Labs  Lab 04/24/22 0453 04/26/22 0444  NA 139  --   K 4.4  --   CL 104  --   CO2 29  --   GLUCOSE 84  --   BUN 12  --   CREATININE 0.54* 0.61  CALCIUM 8.9  --   MG 2.0  --     CBC: Recent Labs  Lab 04/24/22 0453  WBC 10.3  HGB 12.0*  HCT 36.2*  MCV 98.6  PLT 221    LFT No results for input(s): "AST", "ALT", "ALKPHOS", "BILITOT", "PROT",  "ALBUMIN" in the last 168 hours.    Antibiotics: Anti-infectives (From admission, onward)    None        DVT prophylaxis: Lovenox  Code Status: DNR  Family Communication: Discussed with patient and son and daughter-in-law at bedside   CONSULTS palliative care   Objective    Physical Examination:  Appears in no acute distress Tries to communicate, responds to verbal communication Lungs clear to auscultation bilaterally Abdomen is soft, nontender  Status is: Inpatient:        Meredeth Ide   [facility] If 7PM-7AM, please contact night-coverage at [email or web address], Office  [phone]   04/30/2022, 3:23 PM  LOS: 11 days

## 2022-04-30 NOTE — NC FL2 (Signed)
MEDICAID FL2 LEVEL OF CARE FORM     IDENTIFICATION  Patient Name: Trevor Cook Birthdate: 10-Nov-1945 Sex: male Admission Date (Current Location): 04/18/2022  [facility] and [place] Number:  Producer, television/film/video and Address:  [facility],  [address] [place], [place] [address]      Provider Number: [id number]  Attending Physician Name and Address:  Meredeth Ide, MD  Relative Name and Phone Number:   Penrose Marmo(Son) [phone])    Current Level of Care: Hospital Recommended Level of Care: Memory Care Prior Approval Number:    Date Approved/Denied:   PASRR Number:    Discharge Plan: Other (Comment) (Memory)    Current Diagnoses: Patient Active Problem List   Diagnosis Date Noted   Syncope 04/18/2022   Lethargy 06/21/2021   Wernicke's disease 06/20/2021   Dementia with behavioral disturbance (HCC) 06/20/2021   History of CVA (cerebrovascular accident) 06/20/2021   Aspiration into airway, initial encounter 06/18/2021   Macrocytic anemia 06/18/2021   Hyperglycemia 06/18/2021   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HCC) 06/18/2021   Acute metabolic encephalopathy 05/13/2020   Encephalopathy 05/13/2020   Toxic metabolic encephalopathy 05/13/2020   Drug-induced parkinsonism (HCC)    Fall    History of alcohol abuse 04/07/2020   Tremor 09/20/2013   Essential hypertension 09/20/2013   Hypothyroidism 09/20/2013   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/20/2013    Orientation RESPIRATION BLADDER Height & Weight     Self  Normal Incontinent Weight: 62.1 kg Height:  5\' 7"  (170.2 cm)  BEHAVIORAL SYMPTOMS/MOOD NEUROLOGICAL BOWEL NUTRITION STATUS      Incontinent Diet (dysphagia 1)  AMBULATORY STATUS COMMUNICATION OF NEEDS Skin   Extensive Assist Verbally Normal                       Personal Care Assistance Level of Assistance  Bathing, Feeding, Dressing Bathing Assistance: Limited assistance Feeding assistance: Limited assistance Dressing  Assistance: Limited assistance     Functional Limitations Info  Sight, Hearing, Speech Sight Info: Adequate Hearing Info: Adequate Speech Info: Adequate    SPECIAL CARE FACTORS FREQUENCY                       Contractures Contractures Info: Not present    Additional Factors Info  Code Status Code Status Info:  (DNR)             Current Medications (04/30/2022):  This is the current hospital active medication list Current Facility-Administered Medications  Medication Dose Route Frequency Provider Last Rate Last Admin   bisacodyl (DULCOLAX) suppository 10 mg  10 mg Rectal Daily PRN 05/02/2022, MD       carbidopa-levodopa (SINEMET IR) 25-100 MG per tablet immediate release 0.5 tablet  0.5 tablet Oral TID Meredeth Ide Tublu, MD   0.5 tablet at 04/30/22 05/02/22   divalproex (DEPAKOTE SPRINKLE) capsule 375 mg  375 mg Oral 2 times per day 1829, NP   375 mg at 04/29/22 1704   divalproex (DEPAKOTE SPRINKLE) capsule 500 mg  500 mg Oral Daily 05/01/22, NP   500 mg at 04/30/22 0923   enoxaparin (LOVENOX) injection 40 mg  40 mg Subcutaneous Daily 05/02/22 N, DO   40 mg at 04/30/22 05/02/22   levothyroxine (SYNTHROID) tablet 75 mcg  75 mcg Oral Q0600 [id number], MD   75 mcg at 04/29/22 0532   OLANZapine (ZYPREXA) tablet 7.5 mg  7.5 mg Oral BID 05/01/22,  Raynaldo Opitz, MD   7.5 mg at 04/30/22 8088   Oral care mouth rinse  15 mL Mouth Rinse 4 times per day Meredeth Ide, MD   15 mL at 04/30/22 1103   Oral care mouth rinse  15 mL Mouth Rinse PRN Meredeth Ide, MD       polyethylene glycol (MIRALAX / GLYCOLAX) packet 17 g  17 g Oral Daily Meredeth Ide, MD   17 g at 04/30/22 1594   thiamine (VITAMIN B1) tablet 100 mg  100 mg Oral Daily Rolly Salter, MD   100 mg at 04/30/22 5859     Discharge Medications: Please see discharge summary for a list of discharge medications.  Relevant Imaging Results:  Relevant Lab Results:   Additional  Information SSN; [phone]  Latika Kronick, Olegario Messier, [place]

## 2022-04-30 NOTE — Care Management Important Message (Signed)
Important Message  Patient Details IM Letter given Name: Oak Dorey MRN: [id number] Date of Birth: 09/02/1945   Medicare Important Message Given:  Yes     Caren Macadam 04/30/2022, 12:57 PM

## 2022-05-01 NOTE — Progress Notes (Signed)
Mobility Specialist - Progress Note   05/01/22 1111  Mobility  Activity Ambulated with assistance in hallway  Level of Assistance Standby assist, set-up cues, supervision of patient - no hands on  Assistive Device Front wheel walker  Distance Ambulated (ft) 175 ft  Activity Response Tolerated well  Mobility Referral Yes  $Mobility charge 1 Mobility   Pt received in bed and agreed to mobility, had no c/o pain nor discomfort. Pt returned to bed with all needs met and alarm back on with family and staff in room.  Roderick Pee Mobility Specialist

## 2022-05-01 NOTE — Evaluation (Signed)
Occupational Therapy Evaluation Patient Details Name: Trevor Cook MRN: [id number] DOB: [birth year] Today's Date: 05/01/2022   History of Present Illness Pt admitted from memory care unit following syncope and fall.  Pt with hx of bipolar, cerebellar disorder, epilepsy, Wernicke's-Korsakoff syndrome, CVA, Parkinsons, and alchohol-induced persisting dementia   Clinical Impression   Mr. Trevor Cook is a 76 year old man who presents with above medical history. At baseline patient lives at [facility] and ambulates often and without a device. On evaluation, he was min assist to transfer to supine and min guard physically to ambulate in hall approx 200 feet. He was min assist for walker management. Patient able to wash his face but mostly he is max-total assist for ADLs. Facility personal report patient's son would like his father to go to SNF and recover his strength and balance. Patient will benefit from skilled OT services while in hospital to improve deficits and learn compensatory strategies as needed in order to return to PLOF.        Recommendations for follow up therapy are one component of a multi-disciplinary discharge planning process, led by the attending physician.  Recommendations may be updated based on patient status, additional functional criteria and insurance authorization.   Follow Up Recommendations  Skilled nursing-short term rehab (<3 hours/day)     Assistance Recommended at Discharge Frequent or constant Supervision/Assistance  Patient can return home with the following A little help with walking and/or transfers;A lot of help with bathing/dressing/bathroom;Assistance with cooking/housework;Direct supervision/assist for financial management;Assist for transportation;Help with stairs or ramp for entrance;Direct supervision/assist for medications management;Assistance with feeding    Functional Status Assessment  Patient has had a recent decline in their functional status  and demonstrates the ability to make significant improvements in function in a reasonable and predictable amount of time.  Equipment Recommendations  None recommended by OT    Recommendations for Other Services       Precautions / Restrictions Precautions Precautions: Fall Precaution Comments: safety mittens, posey belt Restrictions Weight Bearing Restrictions: No      Mobility Bed Mobility Overal bed mobility: Needs Assistance Bed Mobility: Supine to Sit     Supine to sit: Min assist     General bed mobility comments: Hand hold to transfer out of bed, min guard  to return to bed with  verbal instruction    Transfers Overall transfer level: Needs assistance Equipment used: Rolling walker (2 wheels) Transfers: Sit to/from Stand Sit to Stand: Min guard, Min assist           General transfer comment: Min guard for physical assistance, min assist at times for walker management      Balance Overall balance assessment: Mild deficits observed, not formally tested                                         ADL either performed or assessed with clinical judgement   ADL Overall ADL's : Needs assistance/impaired Eating/Feeding: Total assistance Eating/Feeding Details (indicate cue type and reason): being fed by nursing due to use of safety mittens Grooming: Sitting;Moderate assistance Grooming Details (indicate cue type and reason): able to wash his face - needed some assist to go back over to improve quality of care. Can bring hand to mouth - would need assistance for brushign teeth Upper Body Bathing: Maximal assistance;Sitting   Lower Body Bathing: Maximal assistance;Sit to/from stand   Upper  Body Dressing : Maximal assistance;Sitting   Lower Body Dressing: Maximal assistance;Sit to/from stand   Toilet Transfer: Minimal assistance;Regular Toilet;BSC/3in1;Rolling walker (2 wheels);Grab bars Toilet Transfer Details (indicate cue type and reason):  verbal cues for hand placement Toileting- Clothing Manipulation and Hygiene: Total assistance;Sit to/from stand Toileting - Clothing Manipulation Details (indicate cue type and reason): wears Depends at baseline     Functional mobility during ADLs: Minimal assistance;Rolling walker (2 wheels) General ADL Comments: min guard mostly with some min assist to manage walker for turns and room negotiation     Vision   Vision Assessment?: No apparent visual deficits     Perception     Praxis      Pertinent Vitals/Pain Pain Assessment Pain Assessment: Faces Faces Pain Scale: No hurt     Hand Dominance Right   Extremity/Trunk Assessment Upper Extremity Assessment Upper Extremity Assessment: Overall WFL for tasks assessed   Lower Extremity Assessment Lower Extremity Assessment: Defer to PT evaluation   Cervical / Trunk Assessment Cervical / Trunk Assessment: Normal   Communication Communication Communication: Expressive difficulties (No intelligible speech noted during session)   Cognition Arousal/Alertness: Awake/alert Behavior During Therapy: WFL for tasks assessed/performed Overall Cognitive Status: Difficult to assess                                 General Comments: Patient nonverbal. Able to follow verbal and tactile commands     General Comments       Exercises     Shoulder Instructions      Home Living Family/patient expects to be discharged to:: Skilled nursing facility                                 Additional Comments: Facility personnel reports he ambulates independently without device, ambulates often, has assistance for ADLs, non verbal at baseline      Prior Functioning/Environment Prior Level of Function : Patient poor historian/Family not available                        OT Problem List: Decreased strength;Decreased activity tolerance;Impaired balance (sitting and/or standing);Decreased safety  awareness;Decreased cognition;Decreased knowledge of use of DME or AE;Decreased knowledge of precautions      OT Treatment/Interventions: Self-care/ADL training;Therapeutic exercise;DME and/or AE instruction;Therapeutic activities;Balance training;Patient/family education    OT Goals(Current goals can be found in the care plan section) Acute Rehab OT Goals OT Goal Formulation: Patient unable to participate in goal setting Time For Goal Achievement: 05/15/22 Potential to Achieve Goals: Fair  OT Frequency: Min 2X/week    Co-evaluation              AM-PAC OT "6 Clicks" Daily Activity     Outcome Measure Help from another person eating meals?: Total Help from another person taking care of personal grooming?: A Lot Help from another person toileting, which includes using toliet, bedpan, or urinal?: Total Help from another person bathing (including washing, rinsing, drying)?: A Lot Help from another person to put on and taking off regular upper body clothing?: A Lot Help from another person to put on and taking off regular lower body clothing?: A Lot 6 Click Score: 10   End of Session Equipment Utilized During Treatment: Rolling walker (2 wheels) Nurse Communication: Mobility status  Activity Tolerance: Patient tolerated treatment well Patient left: with call bell/phone within  reach;in bed;with bed alarm set;Other (comment) Quarry manager)  OT Visit Diagnosis: Cognitive communication deficit (R41.841);Unsteadiness on feet (R26.81)                Time: 3833-3832 OT Time Calculation (min): 25 min Charges:  OT General Charges $OT Visit: 1 Visit OT Evaluation $OT Eval Low Complexity: 1 Low OT Treatments $Therapeutic Activity: 8-22 mins  Donnella Sham, OTR/L Acute Care Rehab Services  Office [phone]   Kelli Churn 05/01/2022, 12:03 PM

## 2022-05-01 NOTE — Progress Notes (Signed)
Consultation Progress Note   Patient: Trevor Cook [id number] DOB: 25-Mar-1946 DOA: 04/18/2022 DOS: the patient was seen and examined on 05/01/2022 Primary service: Kamaal Cast, Elgie Collard, MD  Brief hospital course: PMH of Parkinson's disease, bipolar disorder, CVA, hypothyroidism, dementia, Warnicke's syndrome.  Presented to the hospital with passing out event.  Currently significantly confused and drowsy.  Unable to swallow safely.  Palliative care was consulted.  Family's goal is to focus on comfort with plans to discharge back to memory care unit with palliative care services.  Assessment and Plan: Syncope. Unwitnessed fall. Telemetry so far negative. CT head unremarkable. Echocardiogram shows preserved EF.  No valvular abnormalities. Patient does have some artifacts on telemetry which appears to be PSVT but this is actually tremors as QRS complex can be seen marching through this SVTs with the same rate when he does not have any SVT. Monitor for now. -Patient's blood pressure has been stable in the hospital -There is concern that patient may have passed out because of orthostatic hypotension -Will discontinue propranolol and amlodipine; will not restart these medications on discharge   Dementia with agitation. Bipolar 1 disorder. History of Parkinson's disease. Continue Sinemet. -Patient is on dysphagia 1 diet   Dysphagia. Unable to swallow safely. Underwent MBS. Currently NPO. Family's goal is to focus on comfort.  They understand the aspiration risk and would still be preferring to advance the diet. -Patient is currently on dysphagia 1 diet   Agitation. Currently in restraints. Still has a Posey belt.  But wrist restraints were removed. -Will remove restraints today and start telemonitoring for safety; Has been on Depakote for the same.  Depakote level is adequate. Continue current regimen for Depakote. Discontinue Namenda as it can increase agitation. Continue Zyprexa  7.5 mg twice daily.  In the past patient has been on 5 mg 3 times daily dose which was helpful. Monitor for now.   -He cannot go back to skilled nursing facility if he continues to be on restraints.   -Patient is not on restraints -continue telemonitoring  -Social worker will check whether facility can take him back tomorrow   Hypothyroidism. Continue Synthroid. Checks free T4 an TSH.   Goals of care conversation. Palliative care was consulted. Currently palliative care's recommendation is to stabilize the patient and discharged to memory care with palliative care support. Ideally patient will benefit from being on hospice at memory care unit.              TRH will continue to follow the patient.  Subjective: Drowsy but arouses to voice. Non verbal during my encounter, made head and hand gestures  Physical Exam: Vitals:   04/29/22 1116 04/30/22 0621 04/30/22 1248 05/01/22 0648  BP: (!) 122/91 (!) 145/87 120/68 (!) 158/83  Pulse: 83 (!) 51 (!) 57 (!) 58  Resp: 19 16 18 20   Temp: 97.9 F (36.6 C) (!) 97.5 F (36.4 C) 97.9 F (36.6 C) 98.4 F (36.9 C)  TempSrc: Oral Oral Oral   SpO2: 92% 97%  100%  Weight:      Height:      Appears in no acute distress Tries to communicate, responds to verbal communication Lungs clear to auscultation bilaterally Abdomen is soft, nontender  Data Reviewed:  There are no new results to review at this time.  Family Communication:   Time spent: 15 minutes.  Author: , MD 05/01/2022 11:02 AM  For on call review [email or web address]/03/2022.

## 2022-05-01 NOTE — TOC Progression Note (Signed)
Transition of Care [facility]) - Progression Note    Patient Details  Name: Trevor Cook MRN: [id number] Date of Birth: 1946-01-07  Transition of Care [facility]) CM/SW Contact  Johnsie Moscoso, Olegario Messier, RN Phone Number: 05/01/2022, 3:01 PM  Clinical Narrative:[facility] memory care rep made onsite visit-unable to accept back since patient has declined in mobility & adl's. Would recc PT to assess for higher level if appropriate.MD notified.        Expected Discharge Plan:  (TBD) Barriers to Discharge: Continued Medical Work up  Expected Discharge Plan and Services                                               Social Determinants of Health (SDOH) Interventions SDOH Screenings   Food Insecurity: No Food Insecurity (04/19/2022)  Housing: Low Risk  (04/19/2022)  Transportation Needs: No Transportation Needs (04/19/2022)  Utilities: Not At Risk (04/19/2022)  Tobacco Use: Medium Risk (04/18/2022)    Readmission Risk Interventions     No data to display

## 2022-05-02 LAB — BASIC METABOLIC PANEL
Anion gap: 6 (ref 5–15)
BUN: 20 mg/dL (ref 8–23)
CO2: 32 mmol/L (ref 22–32)
Calcium: 9.5 mg/dL (ref 8.9–10.3)
Chloride: 104 mmol/L (ref 98–111)
Creatinine, Ser: 0.67 mg/dL (ref 0.61–1.24)
GFR, Estimated: >60 mL/min (ref >60)
Glucose, Bld: 85 mg/dL (ref 70–99)
Potassium: 4.7 mmol/L (ref 3.5–5.1)
Sodium: 142 mmol/L (ref 135–145)

## 2022-05-02 LAB — CBC
HCT: 36.7 % — ABNORMAL LOW (ref 39.0–52.0)
Hemoglobin: 12 g/dL — ABNORMAL LOW (ref 13.0–17.0)
MCH: 32.6 pg (ref 26.0–34.0)
MCHC: 32.7 g/dL (ref 30.0–36.0)
MCV: 99.7 fL (ref 80.0–100.0)
Platelets: 264 10*3/uL (ref 150–400)
RBC: 3.68 MIL/uL — ABNORMAL LOW (ref 4.22–5.81)
RDW: 12.4 % (ref 11.5–15.5)
WBC: 7.4 10*3/uL (ref 4.0–10.5)
nRBC: 0 % (ref 0.0–0.2)

## 2022-05-02 NOTE — NC FL2 (Signed)
[place] MEDICAID FL2 LEVEL OF CARE FORM     IDENTIFICATION  Patient Name: Trevor Cook Birthdate: 1945-09-28 Sex: male Admission Date (Current Location): 04/18/2022  [facility] and [place] Number:  Producer, television/film/video and Address:  [facility],  [address] [place], [place] [address]      Provider Number: [id number]  Attending Physician Name and Address:  Dibia, Elgie Collard, MD  Relative Name and Phone Number:   Kaigler Rondon(Son) [phone])    Current Level of Care: Hospital Recommended Level of Care: Skilled Nursing Facility Prior Approval Number:    Date Approved/Denied:   PASRR Number:    Discharge Plan: SNF    Current Diagnoses: Patient Active Problem List   Diagnosis Date Noted   Syncope 04/18/2022   Lethargy 06/21/2021   Wernicke's disease 06/20/2021   Dementia with behavioral disturbance (HCC) 06/20/2021   History of CVA (cerebrovascular accident) 06/20/2021   Aspiration into airway, initial encounter 06/18/2021   Macrocytic anemia 06/18/2021   Hyperglycemia 06/18/2021   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HCC) 06/18/2021   Acute metabolic encephalopathy 05/13/2020   Encephalopathy 05/13/2020   Toxic metabolic encephalopathy 05/13/2020   Drug-induced parkinsonism (HCC)    Fall    History of alcohol abuse 04/07/2020   Tremor 09/20/2013   Essential hypertension 09/20/2013   Hypothyroidism 09/20/2013   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/20/2013    Orientation RESPIRATION BLADDER Height & Weight     Self  Normal Incontinent Weight: 62.1 kg Height:  5\' 7"  (170.2 cm)  BEHAVIORAL SYMPTOMS/MOOD NEUROLOGICAL BOWEL NUTRITION STATUS      Incontinent Diet (Dysphagia 1)  AMBULATORY STATUS COMMUNICATION OF NEEDS Skin   Extensive Assist Verbally Normal                       Personal Care Assistance Level of Assistance  Bathing, Feeding, Dressing Bathing Assistance: Limited assistance Feeding assistance: Limited assistance Dressing  Assistance: Limited assistance     Functional Limitations Info  Sight Sight Info: Adequate Hearing Info: Adequate Speech Info: Adequate    SPECIAL CARE FACTORS FREQUENCY  PT (By licensed PT), OT (By licensed OT)     PT Frequency:  (5x week) OT Frequency:  (5x week)            Contractures Contractures Info: Not present    Additional Factors Info  Code Status, Allergies, Psychotropic Code Status Info:  (DNR) Allergies Info:  (NKA) Psychotropic Info:  (sinemet;depakote;zyprexa)         Current Medications (05/02/2022):  This is the current hospital active medication list Current Facility-Administered Medications  Medication Dose Route Frequency Provider Last Rate Last Admin   bisacodyl (DULCOLAX) suppository 10 mg  10 mg Rectal Daily PRN 05/04/2022, MD       carbidopa-levodopa (SINEMET IR) 25-100 MG per tablet immediate release 0.5 tablet  0.5 tablet Oral TID Meredeth Ide Tublu, MD   0.5 tablet at 05/02/22 1540   divalproex (DEPAKOTE SPRINKLE) capsule 375 mg  375 mg Oral 2 times per day 05/04/22, NP   375 mg at 05/02/22 1541   divalproex (DEPAKOTE SPRINKLE) capsule 500 mg  500 mg Oral Daily 05/04/22, NP   500 mg at 05/02/22 0915   enoxaparin (LOVENOX) injection 40 mg  40 mg Subcutaneous Daily 05/04/22 N, DO   40 mg at 05/02/22 0911   levothyroxine (SYNTHROID) tablet 75 mcg  75 mcg Oral Q0600 05/04/22, MD   75 mcg at  05/02/22 0638   OLANZapine (ZYPREXA) tablet 7.5 mg  7.5 mg Oral BID Leandro Reasoner Tublu, MD   7.5 mg at 05/02/22 7588   Oral care mouth rinse  15 mL Mouth Rinse 4 times per day Meredeth Ide, MD   15 mL at 05/02/22 1546   Oral care mouth rinse  15 mL Mouth Rinse PRN Meredeth Ide, MD       polyethylene glycol (MIRALAX / GLYCOLAX) packet 17 g  17 g Oral Daily Meredeth Ide, MD   17 g at 05/02/22 0913   thiamine (VITAMIN B1) tablet 100 mg  100 mg Oral Daily Rolly Salter, MD   100 mg at 05/02/22 3254     Discharge  Medications: Please see discharge summary for a list of discharge medications.  Relevant Imaging Results:  Relevant Lab Results:   Additional Information SSN; [id number]  Nadalee Neiswender, Olegario Messier, [place]

## 2022-05-02 NOTE — Progress Notes (Signed)
Consultation Progress Note   Patient: Trevor Cook OIZ:[id number] DOB: Jun 09, 1945 DOA: 04/18/2022 DOS: the patient was seen and examined on 05/02/2022 Primary service: Meleah Demeyer, Elgie Collard, MD  Brief hospital course: PMH of Parkinson's disease, bipolar disorder, CVA, hypothyroidism, dementia, Warnicke's syndrome.  Presented to the hospital with passing out event.  Currently significantly confused and drowsy.  Unable to swallow safely.  Palliative care was consulted.  Family's goal is to focus on comfort with plans to discharge back to memory care unit with palliative care services.  Assessment and Plan: Syncope. Unwitnessed fall. Telemetry so far negative. CT head unremarkable. Echocardiogram shows preserved EF.  No valvular abnormalities. Patient does have some artifacts on telemetry which appears to be PSVT but this is actually tremors as QRS complex can be seen marching through this SVTs with the same rate when he does not have any SVT. Monitor for now. -Patient's blood pressure has been stable in the hospital -There is concern that patient may have passed out because of orthostatic hypotension -Will discontinue propranolol and amlodipine; will not restart these medications on discharge   Dementia with agitation. Bipolar 1 disorder. History of Parkinson's disease. Continue Sinemet. -Patient is on dysphagia 1 diet   Dysphagia. Unable to swallow safely. Underwent MBS. Currently NPO. Family's goal is to focus on comfort.  They understand the aspiration risk and would still be preferring to advance the diet. -Patient is currently on dysphagia 1 diet   Agitation. Currently in restraints. Still has a Posey belt.  But wrist restraints were removed. -Will remove restraints today and start telemonitoring for safety; Has been on Depakote for the same.  Depakote level is adequate. Continue current regimen for Depakote. Discontinue Namenda as it can increase agitation. Continue Zyprexa  7.5 mg twice daily.  In the past patient has been on 5 mg 3 times daily dose which was helpful. Monitor for now.   -He cannot go back to skilled nursing facility if he continues to be on restraints.   -Patient is not on restraints -continue telemonitoring  -Social worker will check whether facility can take him back  Hypothyroidism. Continue Synthroid. Checks free T4 an TSH.   Goals of care conversation. Palliative care was consulted. Currently palliative care's recommendation is to stabilize the patient and discharged to memory care with palliative care support. Ideally patient will benefit from being on hospice at memory care unit.                   TRH will continue to follow the patient.         TRH will continue to follow the patient.  Subjective: No Acute overnight events.  Physical Exam: Vitals:   05/01/22 0648 05/01/22 1443 05/01/22 2345 05/02/22 0633  BP: (!) 158/83 134/78 133/67 139/78  Pulse: (!) 58 65 (!) 57 (!) 48  Resp: 20 18 16 18   Temp: 98.4 F (36.9 C)  97.9 F (36.6 C) 97.6 F (36.4 C)  TempSrc:   Oral Oral  SpO2: 100% 95% 96% 97%  Weight:      Height:      Appears in no acute distress Tries to communicate, responds to verbal communication Lungs clear to auscultation bilaterally Abdomen is soft, nontender    Data Reviewed:  There are no new results to review at this time.  Family Communication:   Time spent: 15 minutes.  Author: , MD 05/02/2022 10:58 AM  For on call review [email or web address]/04/2022.

## 2022-05-02 NOTE — TOC Progression Note (Addendum)
Transition of Care [facility]) - Progression Note    Patient Details  Name: Trevor Cook MRN: [id number] Date of Birth: 1945-09-25  Transition [facility]) CM/SW Contact  Genevia Bouldin, Olegario Messier, RN Phone Number: 05/02/2022, 4:04 PM  Clinical Narrative:   Faxed out for [facility] SNF await bed offers.Must be 24hr free restraint. Will need pasrr started.     Expected Discharge Plan: Skilled Nursing Facility Barriers to Discharge: Continued Medical Work up  Expected Discharge Plan and Services                                               Social Determinants of Health (SDOH) Interventions SDOH Screenings   Food Insecurity: No Food Insecurity (04/19/2022)  Housing: Low Risk  (04/19/2022)  Transportation Needs: No Transportation Needs (04/19/2022)  Utilities: Not At Risk (04/19/2022)  Tobacco Use: Medium Risk (04/18/2022)    Readmission Risk Interventions     No data to display

## 2022-05-02 NOTE — Progress Notes (Signed)
Physical Therapy Treatment Patient Details Name: Trevor Cook MRN: [id number] DOB: 1945/10/30 Today's Date: 05/02/2022   History of Present Illness Pt admitted from memory care unit following syncope and fall.  Pt with hx of bipolar, cerebellar disorder, epilepsy, Wernicke's-Korsakoff syndrome, CVA, Parkinsons, and alchohol-induced persisting dementia    PT Comments    Pt continues very cooperative and following simple commands for mobility.  This am, pt up to ambulate in halls with RW and with min assist/min guard for most mobility tasks.  Pt would benefit from follow up rehab at SNF level to maximize IND and safety prior to return to memory care unit.     Recommendations for follow up therapy are one component of a multi-disciplinary discharge planning process, led by the attending physician.  Recommendations may be updated based on patient status, additional functional criteria and insurance authorization.  Follow Up Recommendations  Skilled nursing-short term rehab (<3 hours/day) Can patient physically be transported by private vehicle: Yes   Assistance Recommended at Discharge Frequent or constant Supervision/Assistance  Patient can return home with the following A little help with walking and/or transfers;A lot of help with bathing/dressing/bathroom;Assistance with cooking/housework;Assist for transportation;Help with stairs or ramp for entrance   Equipment Recommendations  None recommended by PT    Recommendations for Other Services       Precautions / Restrictions Precautions Precautions: Fall Precaution Comments: safety mittens, posey belt Restrictions Weight Bearing Restrictions: No     Mobility  Bed Mobility Overal bed mobility: Needs Assistance Bed Mobility: Supine to Sit     Supine to sit: Supervision     General bed mobility comments: increased time with use of bed rail and supervision for safety    Transfers Overall transfer level: Needs  assistance Equipment used: Rolling walker (2 wheels) Transfers: Sit to/from Stand Sit to Stand: Min guard, Min assist           General transfer comment: Steady assist only    Ambulation/Gait Ambulation/Gait assistance: Min guard, Min assist Gait Distance (Feet): 450 Feet Assistive device: Rolling walker (2 wheels) Gait Pattern/deviations: Step-through pattern, Decreased step length - right, Decreased step length - left, Shuffle, Trunk flexed       General Gait Details: Generalized instability with minor losses of balance but easily corrected with min assist   Stairs             Wheelchair Mobility    Modified Rankin (Stroke Patients Only)       Balance Overall balance assessment: Needs assistance Sitting-balance support: No upper extremity supported, Feet supported Sitting balance-Leahy Scale: Good     Standing balance support: No upper extremity supported Standing balance-Leahy Scale: Fair                              Cognition Arousal/Alertness: Awake/alert Behavior During Therapy: WFL for tasks assessed/performed, Flat affect Overall Cognitive Status: History of cognitive impairments - at baseline                                 General Comments: Patient nonverbal. Able to follow verbal and tactile commands        Exercises      General Comments        Pertinent Vitals/Pain Pain Assessment Pain Assessment: Faces Pain Score: 0-No pain    Home Living  Prior Function            PT Goals (current goals can now be found in the care plan section) Acute Rehab PT Goals Patient Stated Goal: No goals stated PT Goal Formulation: With patient Time For Goal Achievement: 05/13/22 Potential to Achieve Goals: Fair Progress towards PT goals: Progressing toward goals    Frequency    Min 3X/week      PT Plan Discharge plan needs to be updated    Co-evaluation               AM-PAC PT "6 Clicks" Mobility   Outcome Measure  Help needed turning from your back to your side while in a flat bed without using bedrails?: None Help needed moving from lying on your back to sitting on the side of a flat bed without using bedrails?: A Little Help needed moving to and from a bed to a chair (including a wheelchair)?: A Little Help needed standing up from a chair using your arms (e.g., wheelchair or bedside chair)?: A Little Help needed to walk in hospital room?: A Little Help needed climbing 3-5 steps with a railing? : A Lot 6 Click Score: 18    End of Session Equipment Utilized During Treatment: Gait belt Activity Tolerance: Patient tolerated treatment well Patient left: in chair;with call bell/phone within reach;with chair alarm set;with restraints reapplied;with nursing/sitter in room Nurse Communication: Mobility status PT Visit Diagnosis: Unsteadiness on feet (R26.81);Muscle weakness (generalized) (M62.81);History of falling (Z91.81)     Time: 5631-4970 PT Time Calculation (min) (ACUTE ONLY): 16 min  Charges:  $Gait Training: 8-22 mins                     Mauro Kaufmann PT Acute Rehabilitation Services Pager [phone] Office [phone]    Juluis Fitzsimmons 05/02/2022, 12:25 PM

## 2022-05-03 LAB — CBC
HCT: 36.6 % — ABNORMAL LOW (ref 39.0–52.0)
Hemoglobin: 12.1 g/dL — ABNORMAL LOW (ref 13.0–17.0)
MCH: 33 pg (ref 26.0–34.0)
MCHC: 33.1 g/dL (ref 30.0–36.0)
MCV: 99.7 fL (ref 80.0–100.0)
Platelets: 265 10*3/uL (ref 150–400)
RBC: 3.67 MIL/uL — ABNORMAL LOW (ref 4.22–5.81)
RDW: 12.4 % (ref 11.5–15.5)
WBC: 8.8 10*3/uL (ref 4.0–10.5)
nRBC: 0 % (ref 0.0–0.2)

## 2022-05-03 LAB — BASIC METABOLIC PANEL
Anion gap: 5 (ref 5–15)
BUN: 23 mg/dL (ref 8–23)
CO2: 30 mmol/L (ref 22–32)
Calcium: 9.2 mg/dL (ref 8.9–10.3)
Chloride: 104 mmol/L (ref 98–111)
Creatinine, Ser: 0.67 mg/dL (ref 0.61–1.24)
GFR, Estimated: >60 mL/min (ref >60)
Glucose, Bld: 86 mg/dL (ref 70–99)
Potassium: 4.5 mmol/L (ref 3.5–5.1)
Sodium: 139 mmol/L (ref 135–145)

## 2022-05-03 NOTE — NC FL2 (Signed)
[place] MEDICAID FL2 LEVEL OF CARE FORM     IDENTIFICATION  Patient Name: Trevor Cook Birthdate: [birth year] Sex: male Admission Date (Current Location): 04/18/2022  [facility] and [place] Number:  Producer, television/film/video and Address:  [facility],  [address] [place], [place] [address]      Provider Number: [id number]  Attending Physician Name and Address:  Dibia, Elgie Collard, MD  Relative Name and Phone Number:   Sudbury Ahlberg(Son) [phone])    Current Level of Care: Hospital Recommended Level of Care: Skilled Nursing Facility Prior Approval Number:    Date Approved/Denied:   PASRR Number:  [id number])  Discharge Plan: SNF    Current Diagnoses: Patient Active Problem List   Diagnosis Date Noted   Syncope 04/18/2022   Lethargy 06/21/2021   Wernicke's disease 06/20/2021   Dementia with behavioral disturbance (HCC) 06/20/2021   History of CVA (cerebrovascular accident) 06/20/2021   Aspiration into airway, initial encounter 06/18/2021   Macrocytic anemia 06/18/2021   Hyperglycemia 06/18/2021   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HCC) 06/18/2021   Acute metabolic encephalopathy 05/13/2020   Encephalopathy 05/13/2020   Toxic metabolic encephalopathy 05/13/2020   Drug-induced parkinsonism (HCC)    Fall    History of alcohol abuse 04/07/2020   Tremor 09/20/2013   Essential hypertension 09/20/2013   Hypothyroidism 09/20/2013   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/20/2013    Orientation RESPIRATION BLADDER Height & Weight     Self  Normal Incontinent Weight: 62.1 kg Height:  5\' 7"  (170.2 cm)  BEHAVIORAL SYMPTOMS/MOOD NEUROLOGICAL BOWEL NUTRITION STATUS      Incontinent Diet (Dysphagia 1)  AMBULATORY STATUS COMMUNICATION OF NEEDS Skin   Extensive Assist Verbally Normal                       Personal Care Assistance Level of Assistance  Bathing, Feeding, Dressing Bathing Assistance: Limited assistance Feeding assistance: Limited  assistance Dressing Assistance: Limited assistance     Functional Limitations Info  Sight Sight Info: Adequate Hearing Info: Adequate Speech Info: Adequate    SPECIAL CARE FACTORS FREQUENCY  PT (By licensed PT), OT (By licensed OT)     PT Frequency:  (5x week) OT Frequency:  (5x week)            Contractures Contractures Info: Not present    Additional Factors Info  Code Status, Allergies, Psychotropic Code Status Info:  (DNR) Allergies Info:  (NKA) Psychotropic Info:  (sinemet;depakote;zyprexa)         Current Medications (05/03/2022):  This is the current hospital active medication list Current Facility-Administered Medications  Medication Dose Route Frequency Provider Last Rate Last Admin   bisacodyl (DULCOLAX) suppository 10 mg  10 mg Rectal Daily PRN 05/05/2022, MD       carbidopa-levodopa (SINEMET IR) 25-100 MG per tablet immediate release 0.5 tablet  0.5 tablet Oral TID Meredeth Ide Tublu, MD   0.5 tablet at 05/02/22 2135   divalproex (DEPAKOTE SPRINKLE) capsule 375 mg  375 mg Oral 2 times per day 2136, NP   375 mg at 05/02/22 2133   divalproex (DEPAKOTE SPRINKLE) capsule 500 mg  500 mg Oral Daily 2134, NP   500 mg at 05/02/22 0915   enoxaparin (LOVENOX) injection 40 mg  40 mg Subcutaneous Daily 05/04/22 N, DO   40 mg at 05/02/22 0911   levothyroxine (SYNTHROID) tablet 75 mcg  75 mcg Oral Q0600 05/04/22, MD   75 mcg at  05/03/22 0634   OLANZapine (ZYPREXA) tablet 7.5 mg  7.5 mg Oral BID Leandro Reasoner Tublu, MD   7.5 mg at 05/02/22 2134   Oral care mouth rinse  15 mL Mouth Rinse 4 times per day Meredeth Ide, MD   15 mL at 05/02/22 2135   Oral care mouth rinse  15 mL Mouth Rinse PRN Meredeth Ide, MD       polyethylene glycol (MIRALAX / GLYCOLAX) packet 17 g  17 g Oral Daily Meredeth Ide, MD   17 g at 05/02/22 0913   thiamine (VITAMIN B1) tablet 100 mg  100 mg Oral Daily Rolly Salter, MD   100 mg at 05/02/22 1660      Discharge Medications: Please see discharge summary for a list of discharge medications.  Relevant Imaging Results:  Relevant Lab Results:   Additional Information SSN; [id number]  Gwyn Mehring, Olegario Messier, [place]

## 2022-05-03 NOTE — TOC Progression Note (Addendum)
Transition of Care [facility]) - Progression Note    Patient Details  Name: Trevor Cook MRN: [id number] Date of Birth: 11/27/1945  Transition of Care [facility]) CM/SW Contact  Nachum Derossett, Olegario Messier, RN Phone Number: 05/03/2022, 12:39 PM  Clinical Narrative: Added pasrr to signed fl2. Await bed offers,& restraint free.      Expected Discharge Plan: Skilled Nursing Facility Barriers to Discharge: Continued Medical Work up  Expected Discharge Plan and Services                                               Social Determinants of Health (SDOH) Interventions SDOH Screenings   Food Insecurity: No Food Insecurity (04/19/2022)  Housing: Low Risk  (04/19/2022)  Transportation Needs: No Transportation Needs (04/19/2022)  Utilities: Not At Risk (04/19/2022)  Tobacco Use: Medium Risk (04/18/2022)    Readmission Risk Interventions     No data to display

## 2022-05-03 NOTE — Progress Notes (Signed)
Mobility Specialist - Progress Note   05/03/22 1642  Mobility  Activity Ambulated with assistance in hallway  Level of Assistance Contact guard assist, steadying assist  Assistive Device Front wheel walker  Distance Ambulated (ft) 500 ft  Range of Motion/Exercises Active  Activity Response Tolerated well  Mobility Referral Yes  $Mobility charge 1 Mobility   Pt found awake and agreeable to ambulate. Pt was min-A to go from lying>sitting and contact steadying to assist with R hand tremor. At EOS was left with necessities and RN in room.  Billey Chang Mobility Specialist

## 2022-05-03 NOTE — Plan of Care (Signed)
  Problem: Clinical Measurements: Goal: Ability to maintain clinical measurements within normal limits will improve Outcome: Progressing   Problem: Activity: Goal: Risk for activity intolerance will decrease Outcome: Progressing   Problem: Education: Goal: Knowledge of General Education information will improve Description: Including pain rating scale, medication(s)/side effects and non-pharmacologic comfort measures Outcome: Not Progressing   Problem: Safety: Goal: Non-violent Restraint(s) Outcome: Completed/Met   Problem: Clinical Measurements: Goal: Will remain free from infection Outcome: Completed/Met Goal: Diagnostic test results will improve Outcome: Completed/Met Goal: Respiratory complications will improve Outcome: Completed/Met Goal: Cardiovascular complication will be avoided Outcome: Completed/Met   Problem: Nutrition: Goal: Adequate nutrition will be maintained Outcome: Completed/Met   Problem: Pain Managment: Goal: General experience of comfort will improve Outcome: Completed/Met

## 2022-05-03 NOTE — Progress Notes (Signed)
Consultation Progress Note   Patient: Trevor Cook YTK:[id number] DOB: 1946-02-20 DOA: 04/18/2022 DOS: the patient was seen and examined on 05/03/2022 Primary service: Kehlani Vancamp, Elgie Collard, MD  Brief hospital course: PMH of Parkinson's disease, bipolar disorder, CVA, hypothyroidism, dementia, Warnicke's syndrome.  Presented to the hospital with passing out event.  Currently significantly confused and drowsy.  Unable to swallow safely.  Palliative care was consulted.  Family's goal is to focus on comfort with plans to discharge back to memory care unit with palliative care services.  Assessment and Plan: Syncope. Unwitnessed fall. Telemetry so far negative. CT head unremarkable. Echocardiogram shows preserved EF.  No valvular abnormalities. Patient does have some artifacts on telemetry which appears to be PSVT but this is actually tremors as QRS complex can be seen marching through this SVTs with the same rate when he does not have any SVT. Monitor for now. -Patient's blood pressure has been stable in the hospital -There is concern that patient may have passed out because of orthostatic hypotension -Will discontinue propranolol and amlodipine; will not restart these medications on discharge   Dementia with agitation. Bipolar 1 disorder. History of Parkinson's disease. Continue Sinemet. -Patient is on dysphagia 1 diet   Dysphagia. Unable to swallow safely. Underwent MBS. Currently NPO. Family's goal is to focus on comfort.  They understand the aspiration risk and would still be preferring to advance the diet. -Patient is currently on dysphagia 1 diet   Agitation. Currently  off  restraints. Still has a Posey belt.  But wrist restraints were removed. -Will remove restraints today and start telemonitoring for safety; Has been on Depakote for the same.  Depakote level is adequate. Continue current regimen for Depakote. Discontinue Namenda as it can increase agitation. Continue  Zyprexa 7.5 mg twice daily.  In the past patient has been on 5 mg 3 times daily dose which was helpful. Monitor for now.   -He cannot go back to skilled nursing facility if he continues to be on restraints.   -Patient is not on restraints -continue telemonitoring  -Social worker will check whether facility can take him back  Hypothyroidism. Continue Synthroid. Checks free T4 an TSH.   Goals of care conversation. Palliative care was consulted. Currently palliative care's recommendation is to stabilize the patient and discharged to memory care with palliative care support. Ideally patient will benefit from being on hospice at memory care unit.                   TRH will continue to follow the patient.             TRH will continue to follow the patient.  Subjective: More alert this morning.   CM working on SNF placement, needs Passr  Physical Exam: Vitals:   05/01/22 2345 05/02/22 0633 05/02/22 1344 05/03/22 0520  BP: 133/67 139/78 116/61 132/74  Pulse: (!) 57 (!) 48 82 62  Resp: 16 18 18 20   Temp: 97.9 F (36.6 C) 97.6 F (36.4 C) 98.2 F (36.8 C) 97.8 F (36.6 C)  TempSrc: Oral Oral Oral   SpO2: 96% 97% 97% 100%  Weight:      Height:       Appears in no acute distress Tries to communicate, responds to verbal communication Lungs clear to auscultation bilaterally Abdomen is soft, nontender Data Reviewed:  There are no new results to review at this time.    Time spent: 15 minutes.  Author: , MD 05/03/2022 11:06 AM  For  on call review [email or web address].

## 2022-05-03 NOTE — Progress Notes (Signed)
Received report from off going RN, assessment unchanged 

## 2022-05-04 DIAGNOSIS — R55 Syncope and collapse: Secondary | ICD-10-CM | POA: Diagnosis not present

## 2022-05-04 DIAGNOSIS — S0083XA Contusion of other part of head, initial encounter: Secondary | ICD-10-CM | POA: Diagnosis not present

## 2022-05-04 LAB — CBC
HCT: 38.6 % — ABNORMAL LOW (ref 39.0–52.0)
Hemoglobin: 12.6 g/dL — ABNORMAL LOW (ref 13.0–17.0)
MCH: 32.6 pg (ref 26.0–34.0)
MCHC: 32.6 g/dL (ref 30.0–36.0)
MCV: 99.7 fL (ref 80.0–100.0)
Platelets: 269 10*3/uL (ref 150–400)
RBC: 3.87 MIL/uL — ABNORMAL LOW (ref 4.22–5.81)
RDW: 12.4 % (ref 11.5–15.5)
WBC: 7.7 10*3/uL (ref 4.0–10.5)
nRBC: 0 % (ref 0.0–0.2)

## 2022-05-04 LAB — BASIC METABOLIC PANEL
Anion gap: 4 — ABNORMAL LOW (ref 5–15)
BUN: 17 mg/dL (ref 8–23)
CO2: 33 mmol/L — ABNORMAL HIGH (ref 22–32)
Calcium: 9.4 mg/dL (ref 8.9–10.3)
Chloride: 103 mmol/L (ref 98–111)
Creatinine, Ser: 0.68 mg/dL (ref 0.61–1.24)
GFR, Estimated: >60 mL/min (ref >60)
Glucose, Bld: 89 mg/dL (ref 70–99)
Potassium: 4.4 mmol/L (ref 3.5–5.1)
Sodium: 140 mmol/L (ref 135–145)

## 2022-05-04 MED ORDER — ORAL CARE MOUTH RINSE
15.0000 mL | OROMUCOSAL | Status: DC
  Administered 2022-05-04 – 2022-05-06 (×6): 15 mL via OROMUCOSAL

## 2022-05-04 MED ORDER — ORAL CARE MOUTH RINSE: 15.0000 mL | OROMUCOSAL | Status: DC | PRN

## 2022-05-04 NOTE — Progress Notes (Signed)
   05/04/22 1500  Mobility  Activity Ambulated with assistance in hallway  Range of Motion/Exercises Active;All extremities  Level of Assistance Contact guard assist, steadying assist  Assistive Device Front wheel walker  Distance Ambulated (ft) 750 ft  Activity Response Tolerated well

## 2022-05-04 NOTE — TOC Progression Note (Signed)
Transition of Care [facility]) - Progression Note    Patient Details  Name: Trevor Cook MRN: [id number] Date of Birth: 30-Jul-1945  Transition of Care [facility]) CM/SW Contact  Adrian Prows, RN Phone Number: 05/04/2022, 2:02 PM  Clinical Narrative:    Pt's son [name] given list of bed offers; he says he would like for his father to go to [facility]; pt previously declined; spoke w/ Olegario Messier at facility and she says they have no beds available and unable to meet pt's needs; pt's son notified; awaiting bed choice.   Expected Discharge Plan: Skilled Nursing Facility Barriers to Discharge: Continued Medical Work up  Expected Discharge Plan and Services                                               Social Determinants of Health (SDOH) Interventions SDOH Screenings   Food Insecurity: No Food Insecurity (04/19/2022)  Housing: Low Risk  (04/19/2022)  Transportation Needs: No Transportation Needs (04/19/2022)  Utilities: Not At Risk (04/19/2022)  Tobacco Use: Medium Risk (04/18/2022)    Readmission Risk Interventions     No data to display

## 2022-05-04 NOTE — Progress Notes (Signed)
Mobility Specialist - Progress Note   05/04/22 1037  Mobility  Activity Ambulated with assistance in hallway  Level of Assistance Moderate assist, patient does 50-74%  Assistive Device Front wheel walker  Distance Ambulated (ft) 500 ft  Range of Motion/Exercises Active  Activity Response Tolerated well  Mobility Referral Yes  $Mobility charge 1 Mobility   Pt was found in bed and agreeable to ambulate. Was mod-A going from lying to sitting EOB and contact guard during ambulation to assist with his R hand tremor. Pt was left in bed with necessities in reach and RN notified of session.  Billey Chang Mobility Specialist

## 2022-05-04 NOTE — Plan of Care (Signed)
  Problem: Health Behavior/Discharge Planning: Goal: Ability to manage health-related needs will improve Outcome: Progressing   Problem: Health Behavior/Discharge Planning: Goal: Ability to manage health-related needs will improve Outcome: Progressing   Problem: Education: Goal: Knowledge of General Education information will improve Description: Including pain rating scale, medication(s)/side effects and non-pharmacologic comfort measures Outcome: Adequate for Discharge   Problem: Clinical Measurements: Goal: Ability to maintain clinical measurements within normal limits will improve Outcome: Adequate for Discharge   Problem: Activity: Goal: Risk for activity intolerance will decrease Outcome: Adequate for Discharge   Problem: Coping: Goal: Level of anxiety will decrease Outcome: Adequate for Discharge   Problem: Elimination: Goal: Will not experience complications related to bowel motility Outcome: Adequate for Discharge Goal: Will not experience complications related to urinary retention Outcome: Adequate for Discharge   Problem: Safety: Goal: Ability to remain free from injury will improve Outcome: Adequate for Discharge   Problem: Skin Integrity: Goal: Risk for impaired skin integrity will decrease Outcome: Adequate for Discharge   Problem: Education: Goal: Knowledge of General Education information will improve Description: Including pain rating scale, medication(s)/side effects and non-pharmacologic comfort measures Outcome: Adequate for Discharge   Problem: Clinical Measurements: Goal: Ability to maintain clinical measurements within normal limits will improve Outcome: Adequate for Discharge Goal: Will remain free from infection Outcome: Adequate for Discharge Goal: Diagnostic test results will improve Outcome: Adequate for Discharge Goal: Respiratory complications will improve Outcome: Adequate for Discharge Goal: Cardiovascular complication will be  avoided Outcome: Adequate for Discharge   Problem: Activity: Goal: Risk for activity intolerance will decrease Outcome: Adequate for Discharge   Problem: Nutrition: Goal: Adequate nutrition will be maintained Outcome: Adequate for Discharge   Problem: Coping: Goal: Level of anxiety will decrease Outcome: Adequate for Discharge   Problem: Elimination: Goal: Will not experience complications related to bowel motility Outcome: Adequate for Discharge Goal: Will not experience complications related to urinary retention Outcome: Adequate for Discharge   Problem: Pain Managment: Goal: General experience of comfort will improve Outcome: Adequate for Discharge   Problem: Safety: Goal: Ability to remain free from injury will improve Outcome: Adequate for Discharge   Problem: Skin Integrity: Goal: Risk for impaired skin integrity will decrease Outcome: Adequate for Discharge

## 2022-05-04 NOTE — Progress Notes (Signed)
  Progress Note   Patient: Trevor Cook WYO:[id number] DOB: 1946/05/03 DOA: 04/18/2022     15 DOS: the patient was seen and examined on 05/04/2022   Brief hospital course: PMH of Parkinson's disease, bipolar disorder, CVA, hypothyroidism, dementia, Warnicke's syndrome. Presented to the hospital with passing out event. Currently significantly confused and drowsy. Unable to swallow safely. Palliative care was consulted. Family's goal is to focus on comfort with plans to discharge back to memory care unit with palliative care services.   Assessment and Plan: Syncope. Unwitnessed fall. Telemetry so far negative. CT head unremarkable. Echocardiogram shows preserved EF.  No valvular abnormalities. Patient does have some artifacts on telemetry which appears to be PSVT but this is actually tremors as QRS complex can be seen marching through this SVTs with the same rate when he does not have any SVT. Monitor for now. -Patient's blood pressure has been stable in the hospital -There is concern that patient may have passed out because of orthostatic hypotension -Propranolol and amlodipine was discontinued; will not restart these medications on discharge   Dementia with agitation. Bipolar 1 disorder. History of Parkinson's disease. Continue Sinemet. -Patient is on dysphagia 1 diet   Dysphagia. Unable to swallow safely. Underwent MBS. Currently NPO. Family's goal is to focus on comfort.  They understand the aspiration risk and would still be preferring to advance the diet. -Patient is currently on dysphagia 1 diet   Agitation. Currently  off  restraints. Still has a Posey belt.  But wrist restraints were removed. -Will remove restraints today and start telemonitoring for safety; Has been on Depakote for the same.  Depakote level is adequate. Continue current regimen for Depakote. Discontinue Namenda as it can increase agitation. Continue Zyprexa 7.5 mg twice daily.  In the past patient has  been on 5 mg 3 times daily dose which was helpful. Monitor for now.   -He cannot go back to skilled nursing facility if he continues to be on restraints.   -Patient is not on restraints -continue telemonitoring  -TOC following for SNF placement Hypothyroidism. Continue Synthroid. Checks free T4 an TSH.   Goals of care conversation. Palliative care was consulted. Currently palliative care's recommendation is to stabilize the patient and discharged to memory care with palliative care support. Ideally patient will benefit from being on hospice at memory care unit.        Subjective: Pleasantly confused  Physical Exam: Vitals:   05/03/22 1307 05/03/22 2019 05/04/22 0512 05/04/22 1219  BP: (!) 147/61 121/73 (!) 152/97 129/65  Pulse: (!) 55 70 63 70  Resp: 18 17 18 18   Temp: 97.9 F (36.6 C) 97.9 F (36.6 C)  97.6 F (36.4 C)  TempSrc: Oral     SpO2: 100%  100% 97%  Weight:      Height:       General exam: Laying in bed, in nad Respiratory system: Normal respiratory effort, no audible wheezing Cardiovascular system: perfused, no notable JVD Gastrointestinal system: Soft, nondistended Central nervous system: CN2-12 grossly intact, strength intact Extremities: Perfused, no clubbing Skin: Normal skin turgor, no notable skin lesions seen Psychiatry: Difficult to assess given mentation  Data Reviewed:  Labs reviewed: Na 140, K 4.4, Cr 0.68, Hgb 12.6  Family Communication: Pt in room, family not at bedside  Disposition: Status is: Inpatient Remains inpatient appropriate because: Severity of illness  Planned Discharge Destination: Skilled nursing facility    Author: , MD 05/04/2022 4:47 PM  For on call review [email or web address].

## 2022-05-05 DIAGNOSIS — R55 Syncope and collapse: Secondary | ICD-10-CM | POA: Diagnosis not present

## 2022-05-05 DIAGNOSIS — S0083XA Contusion of other part of head, initial encounter: Secondary | ICD-10-CM | POA: Diagnosis not present

## 2022-05-05 LAB — CBC
HCT: 41.5 % (ref 39.0–52.0)
Hemoglobin: 13.5 g/dL (ref 13.0–17.0)
MCH: 32.5 pg (ref 26.0–34.0)
MCHC: 32.5 g/dL (ref 30.0–36.0)
MCV: 100 fL (ref 80.0–100.0)
Platelets: 292 10*3/uL (ref 150–400)
RBC: 4.15 MIL/uL — ABNORMAL LOW (ref 4.22–5.81)
RDW: 12.6 % (ref 11.5–15.5)
WBC: 9.6 10*3/uL (ref 4.0–10.5)
nRBC: 0 % (ref 0.0–0.2)

## 2022-05-05 LAB — BASIC METABOLIC PANEL
Anion gap: 6 (ref 5–15)
BUN: 22 mg/dL (ref 8–23)
CO2: 33 mmol/L — ABNORMAL HIGH (ref 22–32)
Calcium: 9.8 mg/dL (ref 8.9–10.3)
Chloride: 101 mmol/L (ref 98–111)
Creatinine, Ser: 0.64 mg/dL (ref 0.61–1.24)
GFR, Estimated: >60 mL/min (ref >60)
Glucose, Bld: 89 mg/dL (ref 70–99)
Potassium: 4.6 mmol/L (ref 3.5–5.1)
Sodium: 140 mmol/L (ref 135–145)

## 2022-05-05 NOTE — Progress Notes (Signed)
Mobility Specialist - Progress Note   05/05/22 1531  Mobility  Activity Ambulated with assistance in hallway  Level of Assistance Contact guard assist, steadying assist  Assistive Device None  Distance Ambulated (ft) 1000 ft  Range of Motion/Exercises Active  Activity Response Tolerated well  Mobility Referral Yes  $Mobility charge 1 Mobility   Pt was found on recliner chair and agreeable to ambulate. Was contact guard during ambulation to assist with R hand tremor. At EOS was left on recliner chair with necessities in reach and chair alarm on. RN notified of session.  Billey Chang Mobility Specialist

## 2022-05-05 NOTE — Progress Notes (Signed)
  Progress Note   Patient: Trevor Cook ZOX:[id number] DOB: 05/08/1945 DOA: 04/18/2022     16 DOS: the patient was seen and examined on 05/05/2022   Brief hospital course: PMH of Parkinson's disease, bipolar disorder, CVA, hypothyroidism, dementia, Warnicke's syndrome. Presented to the hospital with passing out event. Currently significantly confused and drowsy. Unable to swallow safely. Palliative care was consulted. Family's goal is to focus on comfort with plans to discharge back to memory care unit with palliative care services.   Assessment and Plan: Syncope. Unwitnessed fall. Telemetry so far negative. CT head unremarkable. Echocardiogram shows preserved EF.  No valvular abnormalities. Patient does have some artifacts on telemetry which appears to be PSVT but this is actually tremors as QRS complex can be seen marching through this SVTs with the same rate when he does not have any SVT. Monitor for now. -Patient's blood pressure has been stable in the hospital -There is concern that patient may have passed out because of orthostatic hypotension -Propranolol and amlodipine was discontinued; will not restart these medications on discharge   Dementia with agitation. Bipolar 1 disorder. History of Parkinson's disease. Continue Sinemet. -Patient is on dysphagia 1 diet   Dysphagia. Unable to swallow safely. Underwent MBS. Currently NPO. Family's goal is to focus on comfort.  They understand the aspiration risk and would still be preferring to advance the diet. -Patient is currently on dysphagia 1 diet   Agitation. Currently  off  restraints. Still has a Posey belt.  But wrist restraints were removed. Has been on Depakote for the same.  Depakote level is adequate. Continue current regimen for Depakote. Discontinue Namenda as it can increase agitation. Continue Zyprexa 7.5 mg twice daily.  In the past patient has been on 5 mg 3 times daily dose which was helpful. Monitor for  now.   -continue telemonitoring  Hypothyroidism. Continue Synthroid. Checks free T4 an TSH. -Re-evaluated by PT with recs for HHPT   Goals of care conversation. Palliative care was consulted. Palliative care's recommendation was initially to stabilize the patient and discharged to memory care with palliative care support. -On re-eval with PT, recs now for home health PT       Subjective: Remains pleasantly confused  Physical Exam: Vitals:   05/04/22 1219 05/04/22 2100 05/05/22 0438 05/05/22 1345  BP: 129/65 128/63 130/69 114/68  Pulse: 70 67 61 (!) 107  Resp: 18 16 18 18   Temp: 97.6 F (36.4 C)  (!) 97.5 F (36.4 C) 97.7 F (36.5 C)  TempSrc:   Oral Oral  SpO2: 97% 99% 99% (!) 89%  Weight:      Height:       General exam: sitting in chair, in no acute distress Respiratory system: normal chest rise, clear, no audible wheezing Cardiovascular system: regular rhythm, s1-s2 Gastrointestinal system: Nondistended, nontender, pos BS Central nervous system: No seizures, no tremors Extremities: No cyanosis, no joint deformities Skin: No rashes, no pallor Psychiatry: difficult to assess given mentation  Data Reviewed:  Labs reviewed: Na 140, K 4.6, Cr 0.64, Hgb 9.6  Family Communication: Pt in room, family not at bedside  Disposition: Status is: Inpatient Remains inpatient appropriate because: Severity of illness  Planned Discharge Destination: Home with Home Health    Author: , MD 05/05/2022 3:31 PM  For on call review [email or web address].

## 2022-05-05 NOTE — TOC Progression Note (Signed)
Transition of Care [facility]) - Progression Note    Patient Details  Name: Trevor Cook MRN: [id number] Date of Birth: [birth year]  [facility]) CM/SW Contact  Avon Mergenthaler, Olegario Messier, RN Phone Number: 05/05/2022, 2:07 PM  Clinical Narrative:Noted PT recc HH @ [facility] rep Crystal is not available until after Holiday per Darlene-TOC to contact rep Tuesday.MD updated.       Expected Discharge Plan: Skilled Nursing Facility Barriers to Discharge: Continued Medical Work up  Expected Discharge Plan and Services                                               Social Determinants of Health (SDOH) Interventions SDOH Screenings   Food Insecurity: No Food Insecurity (04/19/2022)  Housing: Low Risk  (04/19/2022)  Transportation Needs: No Transportation Needs (04/19/2022)  Utilities: Not At Risk (04/19/2022)  Tobacco Use: Medium Risk (04/18/2022)    Readmission Risk Interventions     No data to display

## 2022-05-05 NOTE — Progress Notes (Signed)
Physical Therapy Treatment Patient Details Name: Trevor Cook MRN: [id number] DOB: 1945-06-04 Today's Date: 05/05/2022   History of Present Illness Pt admitted from memory care unit following syncope and fall.  Pt with hx of bipolar, cerebellar disorder, epilepsy, Wernicke's-Korsakoff syndrome, CVA, Parkinsons, and alchohol-induced persisting dementia    PT Comments    Pt AxO x 1 non verbal but does follow repeat functional commands and makes good eye contact as well as respond to name and smile.  Appears at prior functional level. General Gait Details: was amb with nursing staff using walker.  Removed walker, pt was able to amb at [employer]/Supervision just with safety belt a great distance > 750 feet multiple back and forth in hallway.  No over LOB.  Shuffled steps but steady.  Pt was able to self "quick stop" when I threw an object on floof.  Placed my yellow safety belt on floor to see if he would "step over".  He paused and attempted but unable to take a long enough stride to complete.  Typical Parkinson's gait and posture.  Appears at prior level of mobility.  No signs of fatigue.  He was also able to perform toileting "when taken" needing assist with proper peri care.  Did well to stand at sink and wash hands "when instructed to do so". Positioned in recliner with chair alarm.  Placed recliner at door way with tray table :front porch style" so he could be visible. Will consult LPT pt's progress.  Pt would do best to return to his familiar environment at ALF vs SNF. Because pt is mobile, he will need a secured unit such as Memory care.  Will chat TOC to have [facility] review most update Therapy notes.   Recommendations for follow up therapy are one component of a multi-disciplinary discharge planning process, led by the attending physician.  Recommendations may be updated based on patient status, additional functional criteria and insurance authorization.  Follow Up Recommendations   Home health PT [facility] @ ALF) Can patient physically be transported by private vehicle: Yes   Assistance Recommended at Discharge Frequent or constant Supervision/Assistance  Patient can return home with the following A little help with walking and/or transfers;A lot of help with bathing/dressing/bathroom;Assistance with cooking/housework;Assist for transportation;Help with stairs or ramp for entrance   Equipment Recommendations  None recommended by PT    Recommendations for Other Services       Precautions / Restrictions Precautions Precautions: Fall Precaution Comments: Hx Dementia/Parkinson's from a Memory Care Unit Restrictions Weight Bearing Restrictions: No     Mobility  Bed Mobility               General bed mobility comments: OOB    Transfers Overall transfer level: Needs assistance Equipment used: None Transfers: Sit to/from Stand Sit to Stand: Supervision, Min guard           General transfer comment: good use of hands to steady self and good leg strength controlling stand to sit onto lower level toilet.    Ambulation/Gait Ambulation/Gait assistance: Supervision, Min guard Gait Distance (Feet): 750 Feet Assistive device: None Gait Pattern/deviations: Step-through pattern, Decreased step length - right, Decreased step length - left, Shuffle, Trunk flexed Gait velocity: decreased     General Gait Details: was amb with nursing staff using walker.  Removed walker, pt was able to amb at [employer]/Supervision just with safety belt a great distance > 750 feet multiple back and forth in hallway.  No over LOB.  Shuffled steps  but steady.  Pt was able to self "quick stop" when I threw an object on floof.  Placed my yellow safety belt on floor to see if he would "step over".  He paused and attempted but unable to take a long enough stride to complete.  Typical Parkinson's gait and posture.  Appears at prior level of mobility.  No signs of fatigue.  He was also able to  perform toileting "when taken" needing assist with proper peri care.  Did well to stand at sink and wash hands "when instructed to do so".   Stairs             Wheelchair Mobility    Modified Rankin (Stroke Patients Only)       Balance                                            Cognition Arousal/Alertness: Awake/alert   Overall Cognitive Status: History of cognitive impairments - at baseline                                 General Comments: Patient nonverbal. Able to follow verbal and tactile commands.        Exercises      General Comments        Pertinent Vitals/Pain Pain Assessment Pain Assessment: No/denies pain    Home Living                          Prior Function            PT Goals (current goals can now be found in the care plan section) Progress towards PT goals: Progressing toward goals    Frequency    Min 3X/week      PT Plan Discharge plan needs to be updated    Co-evaluation              AM-PAC PT "6 Clicks" Mobility   Outcome Measure  Help needed turning from your back to your side while in a flat bed without using bedrails?: A Little Help needed moving from lying on your back to sitting on the side of a flat bed without using bedrails?: A Little Help needed moving to and from a bed to a chair (including a wheelchair)?: A Little Help needed standing up from a chair using your arms (e.g., wheelchair or bedside chair)?: A Little Help needed to walk in hospital room?: A Little Help needed climbing 3-5 steps with a railing? : A Lot 6 Click Score: 17    End of Session Equipment Utilized During Treatment: Gait belt Activity Tolerance: Patient tolerated treatment well Patient left: in chair;with call bell/phone within reach;with chair alarm set;with restraints reapplied;with nursing/sitter in room Nurse Communication: Mobility status PT Visit Diagnosis: Unsteadiness on feet  (R26.81);Muscle weakness (generalized) (M62.81);History of falling (Z91.81)     Time: 4431-5400 PT Time Calculation (min) (ACUTE ONLY): 26 min  Charges:  $Gait Training: 8-22 mins $Therapeutic Activity: 8-22 mins                     Felecia Shelling  PTA Acute  Rehabilitation Services Office M-F          [phone] Weekend pager [phone]

## 2022-05-06 DIAGNOSIS — S0083XA Contusion of other part of head, initial encounter: Secondary | ICD-10-CM | POA: Diagnosis not present

## 2022-05-06 DIAGNOSIS — R55 Syncope and collapse: Secondary | ICD-10-CM | POA: Diagnosis not present

## 2022-05-06 LAB — BASIC METABOLIC PANEL
Anion gap: 8 (ref 5–15)
BUN: 23 mg/dL (ref 8–23)
CO2: 31 mmol/L (ref 22–32)
Calcium: 9.7 mg/dL (ref 8.9–10.3)
Chloride: 98 mmol/L (ref 98–111)
Creatinine, Ser: 0.6 mg/dL — ABNORMAL LOW (ref 0.61–1.24)
GFR, Estimated: >60 mL/min (ref >60)
Glucose, Bld: 77 mg/dL (ref 70–99)
Potassium: 3.6 mmol/L (ref 3.5–5.1)
Sodium: 137 mmol/L (ref 135–145)

## 2022-05-06 LAB — CBC
HCT: 42.2 % (ref 39.0–52.0)
Hemoglobin: 13.9 g/dL (ref 13.0–17.0)
MCH: 33 pg (ref 26.0–34.0)
MCHC: 32.9 g/dL (ref 30.0–36.0)
MCV: 100.2 fL — ABNORMAL HIGH (ref 80.0–100.0)
Platelets: 293 10*3/uL (ref 150–400)
RBC: 4.21 MIL/uL — ABNORMAL LOW (ref 4.22–5.81)
RDW: 12.6 % (ref 11.5–15.5)
WBC: 10.5 10*3/uL (ref 4.0–10.5)
nRBC: 0 % (ref 0.0–0.2)

## 2022-05-06 MED ORDER — ORAL CARE MOUTH RINSE
15.0000 mL | OROMUCOSAL | Status: DC
  Administered 2022-05-07 (×2): 15 mL via OROMUCOSAL

## 2022-05-06 MED ORDER — ORAL CARE MOUTH RINSE: 15.0000 mL | OROMUCOSAL | Status: DC | PRN

## 2022-05-06 NOTE — Progress Notes (Signed)
Mobility Specialist - Progress Note   05/06/22 1242  Mobility  Activity Ambulated with assistance in hallway  Level of Assistance Contact guard assist, steadying assist  Assistive Device None  Distance Ambulated (ft) 1000 ft  Range of Motion/Exercises Active  Activity Response Tolerated well  Mobility Referral Yes  $Mobility charge 1 Mobility   Pt was found in bed and agreeable to ambulate. Was contact guard during ambulation to assist with R hand tremor. At EOS returned to bed with necessities in reach and RN notified.  Ferd Hibbs Mobility Specialist

## 2022-05-06 NOTE — Progress Notes (Signed)
  Progress Note   Patient: Trevor Cook EUM:[id number] DOB: 1946-01-30 DOA: 04/18/2022     17 DOS: the patient was seen and examined on 05/06/2022   Brief hospital course: PMH of Parkinson's disease, bipolar disorder, CVA, hypothyroidism, dementia, Warnicke's syndrome. Presented to the hospital with passing out event. Currently significantly confused and drowsy. Unable to swallow safely. Palliative care was consulted. Family's goal is to focus on comfort with plans to discharge back to memory care unit with palliative care services.   Assessment and Plan: Syncope. Unwitnessed fall. Telemetry so far negative. CT head unremarkable. Echocardiogram shows preserved EF.  No valvular abnormalities. Patient does have some artifacts on telemetry which appears to be PSVT but this is actually tremors as QRS complex can be seen marching through this SVTs with the same rate when he does not have any SVT. Monitor for now. -Patient's blood pressure has been stable in the hospital -There is concern that patient may have passed out because of orthostatic hypotension -Propranolol and amlodipine was discontinued; will not restart these medications on discharge   Dementia with agitation. Bipolar 1 disorder. History of Parkinson's disease. Continue Sinemet. -Patient is on dysphagia 1 diet, needs assistance with feeding   Dysphagia. Unable to swallow safely. Underwent MBS. Currently NPO. Family's goal is to focus on comfort.  They understand the aspiration risk and would still be preferring to advance the diet. -Patient is currently on dysphagia 1 diet   Agitation. Currently  off  restraints. Still has a Posey belt.  But wrist restraints were removed. Has been on Depakote for the same.  Depakote level is adequate. Continue current regimen for Depakote. Discontinue Namenda as it can increase agitation. Continue Zyprexa 7.5 mg twice daily.  In the past patient has been on 5 mg 3 times daily dose which  was helpful. Monitor for now.   -continue telemonitoring   Hypothyroidism. Continue Synthroid. Most recent TSH of 3.933 on 06/21/21 -Re-evaluated by PT with recs for HHPT   Goals of care conversation. Palliative care was consulted. Palliative care's recommendation was initially to stabilize the patient and discharged to memory care with palliative care support. -On re-eval with PT, recs now for home health PT       Subjective: Pleasantly confused this AM  Physical Exam: Vitals:   05/05/22 1345 05/05/22 1550 05/05/22 2114 05/06/22 0540  BP: 114/68  108/86 (!) 166/86  Pulse: (!) 107 65 66 (!) 59  Resp: 18  18 18   Temp: 97.7 F (36.5 C)  (!) 97.5 F (36.4 C)   TempSrc: Oral  Axillary   SpO2: (!) 89% 100% 100%   Weight:      Height:       General exam: Awake, laying in bed, in nad Respiratory system: Normal resp effort, no auditory wheezing Cardiovascular system: Perfused, no notable JVD Gastrointestinal system: Soft, nondistended, positive BS Central nervous system: CN2-12 grossly intact, strength intact Extremities: Perfused, no clubbing Skin: Normal skin turgor, no notable skin lesions seen Psychiatry: difficult to assess given mentation  Data Reviewed:  Labs reviewed: Na 137, K 3.6, Cr 0.60, Hgb 13.9  Family Communication: Pt in room, family not at bedside  Disposition: Status is: Inpatient Remains inpatient appropriate because: Severity of illness  Planned Discharge Destination: Home with Home Health    Author: Marylu Lund, MD 05/06/2022 3:25 PM  For on call review [email or web address].

## 2022-05-07 DIAGNOSIS — R55 Syncope and collapse: Secondary | ICD-10-CM | POA: Diagnosis not present

## 2022-05-07 DIAGNOSIS — S0083XA Contusion of other part of head, initial encounter: Secondary | ICD-10-CM | POA: Diagnosis not present

## 2022-05-07 MED ORDER — DIVALPROEX SODIUM 125 MG PO CSDR: 375.0000 mg | DELAYED_RELEASE_CAPSULE | Freq: Two times a day (BID) | ORAL | 0 refills | Status: AC

## 2022-05-07 MED ORDER — ORAL CARE MOUTH RINSE: 15.0000 mL | OROMUCOSAL | Status: DC | PRN

## 2022-05-07 MED ORDER — DIVALPROEX SODIUM 125 MG PO CSDR: 500.0000 mg | DELAYED_RELEASE_CAPSULE | Freq: Every day | ORAL | 0 refills | Status: AC

## 2022-05-07 MED ORDER — POLYETHYLENE GLYCOL 3350 17 G PO PACK: 17.0000 g | PACK | Freq: Every day | ORAL | 0 refills | Status: AC

## 2022-05-07 MED ORDER — ORAL CARE MOUTH RINSE
15.0000 mL | OROMUCOSAL | Status: DC
  Administered 2022-05-07: 15 mL via OROMUCOSAL

## 2022-05-07 MED ORDER — BISACODYL 10 MG RE SUPP: 10.0000 mg | Freq: Every day | RECTAL | 0 refills | Status: AC | PRN

## 2022-05-07 NOTE — Progress Notes (Signed)
Physical Therapy Treatment Patient Details Name: Emrys Mckamie MRN: [id number] DOB: 1945-08-14 Today's Date: 05/07/2022   History of Present Illness Pt admitted from memory care unit following syncope and fall.  Pt with hx of bipolar, cerebellar disorder, epilepsy, Wernicke's-Korsakoff syndrome, CVA, Parkinsons, and alchohol-induced persisting dementia    PT Comments    Pt is progressing well with mobility, he ambulated 500' with min hand held assist, no loss of balance. Pt was nonverbal. Pleasant and cooperative.    Recommendations for follow up therapy are one component of a multi-disciplinary discharge planning process, led by the attending physician.  Recommendations may be updated based on patient status, additional functional criteria and insurance authorization.  Follow Up Recommendations  Home health PT Can patient physically be transported by private vehicle: Yes   Assistance Recommended at Discharge Intermittent Supervision/Assistance  Patient can return home with the following A little help with walking and/or transfers;Assistance with cooking/housework;Assist for transportation;Help with stairs or ramp for entrance;A lot of help with bathing/dressing/bathroom;Direct supervision/assist for medications management   Equipment Recommendations  None recommended by PT    Recommendations for Other Services       Precautions / Restrictions Precautions Precautions: Fall Precaution Comments: Hx Dementia/Parkinson's from a Memory Care Unit Restrictions Weight Bearing Restrictions: No     Mobility  Bed Mobility   Bed Mobility: Supine to Sit     Supine to sit: Min assist     General bed mobility comments: min A to raise trunk    Transfers Overall transfer level: Needs assistance Equipment used: None   Sit to Stand: Min guard           General transfer comment: min/guard safety, no loss of balance    Ambulation/Gait Ambulation/Gait assistance: Min guard Gait  Distance (Feet): 500 Feet Assistive device: 1 person hand held assist Gait Pattern/deviations: Trunk flexed, Decreased stride length       General Gait Details: steady with min/guard HHA, tremor noted RUE   Stairs             Wheelchair Mobility    Modified Rankin (Stroke Patients Only)       Balance Overall balance assessment: Needs assistance Sitting-balance support: No upper extremity supported, Feet supported Sitting balance-Leahy Scale: Good     Standing balance support: No upper extremity supported Standing balance-Leahy Scale: Fair                              Cognition Arousal/Alertness: Awake/alert Behavior During Therapy: WFL for tasks assessed/performed, Flat affect Overall Cognitive Status: History of cognitive impairments - at baseline                                 General Comments: Patient nonverbal. Able to follow verbal and tactile commands.        Exercises      General Comments        Pertinent Vitals/Pain Pain Assessment Pain Score: 0-No pain Faces Pain Scale: No hurt Breathing: normal Negative Vocalization: none Facial Expression: smiling or inexpressive Body Language: relaxed Consolability: no need to console PAINAD Score: 0    Home Living                          Prior Function            PT Goals (current goals can now  be found in the care plan section) Acute Rehab PT Goals Patient Stated Goal: No goals stated PT Goal Formulation: With patient Time For Goal Achievement: 05/13/22 Potential to Achieve Goals: Fair Progress towards PT goals: Progressing toward goals    Frequency    Min 3X/week      PT Plan Current plan remains appropriate    Co-evaluation              AM-PAC PT "6 Clicks" Mobility   Outcome Measure  Help needed turning from your back to your side while in a flat bed without using bedrails?: A Little Help needed moving from lying on your back to  sitting on the side of a flat bed without using bedrails?: A Little Help needed moving to and from a bed to a chair (including a wheelchair)?: A Little Help needed standing up from a chair using your arms (e.g., wheelchair or bedside chair)?: A Little Help needed to walk in hospital room?: A Little Help needed climbing 3-5 steps with a railing? : A Little 6 Click Score: 18    End of Session Equipment Utilized During Treatment: Gait belt Activity Tolerance: Patient tolerated treatment well Patient left: in chair;with call bell/phone within reach;with chair alarm set;with restraints reapplied Nurse Communication: Mobility status PT Visit Diagnosis: Unsteadiness on feet (R26.81);Muscle weakness (generalized) (M62.81);History of falling (Z91.81)     Time: 4431-5400 PT Time Calculation (min) (ACUTE ONLY): 14 min  Charges:  $Gait Training: 8-22 mins                    Blondell Reveal Kistler PT 05/07/2022  Acute Rehabilitation Services  Office [phone]

## 2022-05-07 NOTE — TOC Transition Note (Addendum)
Transition of Care [facility]) - CM/SW Discharge Note   Patient Details  Name: Trevor Cook MRN: [id number] Date of Birth: [birth year]  [facility]) CM/SW Contact:  Dessa Phi, RN Phone Number: 05/07/2022, 3:25 PM   Clinical Narrative:[facility] memory care rep Crystal able to accept back-d/c summary sent to fax#[phone]-await rm#,report tel# prior PTAR.  Facility has own HHPT. -4p going to rm#[id number], report tel#[phone]. PTAR called. No further CM needs.     Final next level of care: Memory Care Barriers to Discharge: No Barriers Identified   Patient Goals and CMS Choice      Discharge Placement                Patient chooses bed at: Other - please specify in the comment section below: [facility]) Patient to be transferred to facility by:  Corey Harold) Name of family member notified:  (Trevor Cook(son);Trevor Cook(dtr in law)) Patient and family notified of of transfer: 05/07/22  Discharge Plan and Services Additional resources added to the After Visit Summary for                                       Social Determinants of Health (SDOH) Interventions SDOH Screenings   Food Insecurity: No Food Insecurity (04/19/2022)  Housing: Low Risk  (04/19/2022)  Transportation Needs: No Transportation Needs (04/19/2022)  Utilities: Not At Risk (04/19/2022)  Tobacco Use: Medium Risk (04/18/2022)     Readmission Risk Interventions     No data to display

## 2022-05-07 NOTE — Progress Notes (Signed)
  Daily Progress Note   Patient Name: Trevor Cook       Date: 05/07/2022 DOB: 04-15-1946  Age: 77 y.o. MRN#: [id number] Attending Physician: Donne Hazel, MD Primary Care Physician: Tamsen Roers, MD Admit Date: 04/18/2022 Length of Stay: 18 days  AS per EMR review, patient being discharge to memory care facility today. Patient to remain DNR/DNI. PMT recommended addition of palliative medication services at patient's facility. As goals for medical care determined and patient being discharged, PMT will sign off. Please reach out if PMT can be of further assistance in the future. Thank you.    Chelsea Aus, DO Palliative Care Provider PMT # [phone]

## 2022-05-07 NOTE — [place] FL2 (Addendum)
[place] LEVEL OF CARE FORM     IDENTIFICATION  Patient Name: Trevor Cook Birthdate: 10/05/1945 Sex: male Admission Date (Current Location): 04/18/2022  [facility] and [place] Number:  Herbalist and Address:  [facility],  [place], [place]      Provider Number: [id number]  Attending Physician Name and Address:  Donne Hazel, MD  Relative Name and Phone Number:   Ariola Marston(Son) [phone])    Current Level of Care: Hospital Recommended Level of Care: Memory Care [facility] memory care) Prior Approval Number:    Date Approved/Denied:   PASRR Number:  ([id number])  Discharge Plan: Other (Comment) ([place])    Current Diagnoses: Patient Active Problem List   Diagnosis Date Noted   Syncope 04/18/2022   Lethargy 06/21/2021   Wernicke's disease 06/20/2021   Dementia with behavioral disturbance ([place]) 06/20/2021   History of CVA (cerebrovascular accident) 06/20/2021   Aspiration into airway, initial encounter 06/18/2021   Macrocytic anemia 06/18/2021   Hyperglycemia 06/18/2021   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[place]) 85/63/1497   Acute metabolic encephalopathy 02/63/7858   Encephalopathy 85/06/7739   Toxic metabolic encephalopathy 28/78/6767   Drug-induced parkinsonism ([place])    Fall    History of alcohol abuse 04/07/2020   Tremor 09/20/2013   Essential hypertension 09/20/2013   Hypothyroidism 09/20/2013   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    Orientation RESPIRATION BLADDER Height & Weight     Self  Normal Incontinent Weight: 62.1 kg Height:  5\' 7"  (170.2 cm)  BEHAVIORAL SYMPTOMS/MOOD NEUROLOGICAL BOWEL NUTRITION STATUS      Incontinent Puree diet w/nectar thick liquids  AMBULATORY STATUS COMMUNICATION OF NEEDS Skin   Limited Assist Verbally Normal                       Personal Care Assistance Level of Assistance  Bathing, Feeding, Dressing Bathing Assistance: Limited  assistance Feeding assistance: Limited assistance Dressing Assistance: Limited assistance     Functional Limitations Info  Sight, Hearing, Speech Sight Info: Adequate Hearing Info: Adequate Speech Info: Adequate    SPECIAL CARE FACTORS FREQUENCY  PT (By licensed PT), OT (By licensed OT)     PT Frequency:  (2x week) OT Frequency:  (2x week)            Contractures Contractures Info: Not present    Additional Factors Info  Code Status, Allergies, Psychotropic Code Status Info:  (DNR) Allergies Info:  (NKA) Psychotropic Info:  (sinemet,depakote,zyprexa)         Current Medications (05/07/2022):  This is the current hospital active medication list Current Facility-Administered Medications  Medication Dose Route Frequency Provider Last Rate Last Admin   bisacodyl (DULCOLAX) suppository 10 mg  10 mg Rectal Daily PRN Oswald Hillock, MD   10 mg at 05/05/22 1836   carbidopa-levodopa (SINEMET IR) 25-100 MG per tablet immediate release 0.5 tablet  0.5 tablet Oral TID Bonnell Public Tublu, MD   0.5 tablet at 05/07/22 0846   divalproex (DEPAKOTE SPRINKLE) capsule 375 mg  375 mg Oral 2 times per day Kathryne Eriksson, NP   375 mg at 05/06/22 2235   divalproex (DEPAKOTE SPRINKLE) capsule 500 mg  500 mg Oral Daily Kathryne Eriksson, NP   500 mg at 05/07/22 0847   enoxaparin (LOVENOX) injection 40 mg  40 mg Subcutaneous Daily Irene Pap N, DO   40 mg at 05/07/22 2094   levothyroxine (SYNTHROID) tablet 75 mcg  75 mcg Oral Q0600 Lavina Hamman, MD   75 mcg at 05/07/22 0614   OLANZapine (ZYPREXA) tablet 7.5 mg  7.5 mg Oral BID Bonnell Public Tublu, MD   7.5 mg at 05/07/22 9432   Oral care mouth rinse  15 mL Mouth Rinse 4 times per day Donne Hazel, MD   15 mL at 05/07/22 1208   polyethylene glycol (MIRALAX / GLYCOLAX) packet 17 g  17 g Oral Daily Oswald Hillock, MD   17 g at 05/07/22 7614   thiamine (VITAMIN B1) tablet 100 mg  100 mg Oral Daily Lavina Hamman, MD   100 mg at  05/07/22 7092     Discharge Medications: Please see discharge summary for a list of discharge medications.  Relevant Imaging Results:  Relevant Lab Results:   Additional Information SSN; [id number]  Taran Hable, [name], [place]

## 2022-05-07 NOTE — [place] FL2 (Signed)
[place] MEDICAID FL2 LEVEL OF CARE FORM     IDENTIFICATION  Patient Name: Trevor Cook Birthdate: 12/17/1945 Sex: male Admission Date (Current Location): 04/18/2022  County and Medicaid Number:  [place]   Facility and Address:  [facility],  [address], [place] [address]      Provider Number: [id number]  Attending Physician Name and Address:  Chiu, Stephen K, MD  Relative Name and Phone Number:   (Trevor Cook(Son) [phone])    Current Level of Care: Hospital Recommended Level of Care: Memory Care (Secured memory care) Prior Approval Number:    Date Approved/Denied:   PASRR Number:  ([id number])  Discharge Plan: Other (Comment) (Secured Memory Care)    Current Diagnoses: Patient Active Problem List   Diagnosis Date Noted   Syncope 04/18/2022   Lethargy 06/21/2021   Wernicke's disease 06/20/2021   Dementia with behavioral disturbance (HCC) 06/20/2021   History of CVA (cerebrovascular accident) 06/20/2021   Aspiration into airway, initial encounter 06/18/2021   Macrocytic anemia 06/18/2021   Hyperglycemia 06/18/2021   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HCC) 06/18/2021   Acute metabolic encephalopathy 05/13/2020   Encephalopathy 05/13/2020   Toxic metabolic encephalopathy 05/13/2020   Drug-induced parkinsonism (HCC)    Fall    History of alcohol abuse 04/07/2020   Tremor 09/20/2013   Essential hypertension 09/20/2013   Hypothyroidism 09/20/2013   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/20/2013    Orientation RESPIRATION BLADDER Height & Weight     Self  Normal Incontinent Weight: 62.1 kg Height:  5' 7" (170.2 cm)  BEHAVIORAL SYMPTOMS/MOOD NEUROLOGICAL BOWEL NUTRITION STATUS      Incontinent Diet (Puree diet w/nectar thick liquids)  AMBULATORY STATUS COMMUNICATION OF NEEDS Skin   Limited Assist Verbally Normal                       Personal Care Assistance Level of Assistance  Bathing, Feeding, Dressing Bathing Assistance:  Limited assistance Feeding assistance: Limited assistance Dressing Assistance: Limited assistance     Functional Limitations Info  Sight, Hearing, Speech Sight Info: Adequate Hearing Info: Adequate Speech Info: Adequate    SPECIAL CARE FACTORS FREQUENCY  PT (By licensed PT), OT (By licensed OT)     PT Frequency:  (2x week) OT Frequency:  (2x week)            Contractures Contractures Info: Not present    Additional Factors Info  Code Status, Allergies, Psychotropic Code Status Info:  (DNR) Allergies Info:  (NKA) Psychotropic Info:  (sinemet,depakote,zyprexa)         Current Medications (05/07/2022):  This is the current hospital active medication list Current Facility-Administered Medications  Medication Dose Route Frequency Provider Last Rate Last Admin   bisacodyl (DULCOLAX) suppository 10 mg  10 mg Rectal Daily PRN Lama, Gagan S, MD   10 mg at 05/05/22 1836   carbidopa-levodopa (SINEMET IR) 25-100 MG per tablet immediate release 0.5 tablet  0.5 tablet Oral TID Chatterjee, Srobona Tublu, MD   0.5 tablet at 05/07/22 0846   divalproex (DEPAKOTE SPRINKLE) capsule 375 mg  375 mg Oral 2 times per day Daniels, James K, NP   375 mg at 05/06/22 2235   divalproex (DEPAKOTE SPRINKLE) capsule 500 mg  500 mg Oral Daily Daniels, James K, NP   500 mg at 05/07/22 0847   enoxaparin (LOVENOX) injection 40 mg  40 mg Subcutaneous Daily Hall, Carole N, DO   40 mg at 05/07/22 0852   levothyroxine (SYNTHROID) tablet 75 mcg    75 mcg Oral Q0600 Lavina Hamman, MD   75 mcg at 05/07/22 0614   OLANZapine (ZYPREXA) tablet 7.5 mg  7.5 mg Oral BID Bonnell Public Tublu, MD   7.5 mg at 05/07/22 5329   Oral care mouth rinse  15 mL Mouth Rinse 4 times per day Donne Hazel, MD   15 mL at 05/07/22 1208   polyethylene glycol (MIRALAX / GLYCOLAX) packet 17 g  17 g Oral Daily Oswald Hillock, MD   17 g at 05/07/22 9242   thiamine (VITAMIN B1) tablet 100 mg  100 mg Oral Daily Lavina Hamman, MD   100 mg  at 05/07/22 6834     Discharge Medications: Please see discharge summary for a list of discharge medications.  Relevant Imaging Results:  Relevant Lab Results:   Additional Information SSN; [id number]  Trevor Cook, Trevor Cook, [place]

## 2022-05-07 NOTE — NC FL2 (Signed)
[place] LEVEL OF CARE FORM     IDENTIFICATION  Patient Name: Trevor Cook Birthdate: 1946-01-26 Sex: male Admission Date (Current Location): 04/18/2022  [facility] and [place] Number:  Herbalist and Address:  [facility],  [place], [place]      Provider Number: [id number]  Attending Physician Name and Address:  Donne Hazel, MD  Relative Name and Phone Number:   Debella Kujawa(Son) [phone])    Current Level of Care: Hospital Recommended Level of Care: Memory Care 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[place]) 99/37/1696   Acute metabolic encephalopathy 78/93/8101   Encephalopathy 75/02/2584   Toxic metabolic encephalopathy 27/78/2423   Drug-induced parkinsonism ([place])    Fall    History of alcohol abuse 04/07/2020   Tremor 09/20/2013   Essential hypertension 09/20/2013   Hypothyroidism 09/20/2013   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    Orientation RESPIRATION BLADDER Height & Weight     Self  Normal Incontinent Weight: 62.1 kg Height:  5\' 7"  (170.2 cm)  BEHAVIORAL SYMPTOMS/MOOD NEUROLOGICAL BOWEL NUTRITION STATUS      Incontinent Diet (Puree diet w/nectar thick liquids)  AMBULATORY STATUS COMMUNICATION OF NEEDS Skin   Limited Assist Verbally Normal                       Personal Care Assistance Level of Assistance  Bathing, Feeding, Dressing Bathing Assistance:  Limited assistance Feeding assistance: Limited assistance Dressing Assistance: Limited assistance     Functional Limitations Info  Sight, Hearing, Speech Sight Info: Adequate Hearing Info: Adequate Speech Info: Adequate    SPECIAL CARE FACTORS FREQUENCY  PT (By licensed PT), OT (By licensed OT)     PT Frequency:  (2x week) OT Frequency:  (2x week)            Contractures Contractures Info: Not present    Additional Factors Info  Code Status, Allergies, Psychotropic Code Status Info:  (DNR) Allergies Info:  (NKA) Psychotropic Info:  (sinemet,depakote,zyprexa)         Current Medications (05/07/2022):  This is the current hospital active medication list Current Facility-Administered Medications  Medication Dose Route Frequency Provider Last Rate Last Admin   bisacodyl (DULCOLAX) suppository 10 mg  10 mg Rectal Daily PRN Oswald Hillock, MD   10 mg at 05/05/22 1836   carbidopa-levodopa (SINEMET IR) 25-100 MG per tablet immediate release 0.5 tablet  0.5 tablet Oral TID Bonnell Public Tublu, MD   0.5 tablet at 05/07/22 0846   divalproex (DEPAKOTE SPRINKLE) capsule 375 mg  375 mg Oral 2 times per day Kathryne Eriksson, NP   375 mg at 05/06/22 2235   divalproex (DEPAKOTE SPRINKLE) capsule 500 mg  500 mg Oral Daily Kathryne Eriksson, NP   500 mg at 05/07/22 0847   enoxaparin (LOVENOX) injection 40 mg  40 mg Subcutaneous Daily Irene Pap N, DO   40 mg at 05/07/22 5361   levothyroxine (SYNTHROID) tablet 75 mcg  75 mcg Oral Q0600 Lavina Hamman, MD   75 mcg at 05/07/22 0614   OLANZapine (ZYPREXA) tablet 7.5 mg  7.5 mg Oral BID Bonnell Public Tublu, MD   7.5 mg at 05/07/22 5329   Oral care mouth rinse  15 mL Mouth Rinse 4 times per day Donne Hazel, MD   15 mL at 05/07/22 1208   polyethylene glycol (MIRALAX / GLYCOLAX) packet 17 g  17 g Oral Daily Oswald Hillock, MD   17 g at 05/07/22 9242   thiamine (VITAMIN B1) tablet 100 mg  100 mg Oral Daily Lavina Hamman, MD   100 mg  at 05/07/22 6834     Discharge Medications: Please see discharge summary for a list of discharge medications.  Relevant Imaging Results:  Relevant Lab Results:   Additional Information SSN; [id number]  Talib Headley, [name], [place]

## 2022-05-07 NOTE — TOC Progression Note (Addendum)
Transition of Care [facility]) - Progression Note    Patient Details  Name: Trevor Cook MRN: [id number] Date of Birth: 26-Jul-1945  Transition of Care [facility]) CM/SW Contact  Lizza Huffaker, [name], RN Phone Number: 05/07/2022, 10:31 AM  Clinical Narrative: Will confirm if returning back to [facility] w/HHPT is appropriate rep Crystal aware.If ST SNF needed-have provided bed offers to [place](son).Faxed w/confirmation updated PT notes.Spoke to Rahul(son) & left vm about current status.MD updated. -2:48p-left vm w/[facility] rep Crystal to inform me if she has all info to determine if patient able to return-await call back.    Expected Discharge Plan:  (TBD) Barriers to Discharge: Continued Medical Work up  Expected Discharge Plan and Services                                               Social Determinants of Health (SDOH) Interventions SDOH Screenings   Food Insecurity: No Food Insecurity (04/19/2022)  Housing: Low Risk  (04/19/2022)  Transportation Needs: No Transportation Needs (04/19/2022)  Utilities: Not At Risk (04/19/2022)  Tobacco Use: Medium Risk (04/18/2022)    Readmission Risk Interventions     No data to display

## 2022-05-07 NOTE — Discharge Summary (Signed)
Physician Discharge Summary   Patient: Trevor Cook MRN: [id number] DOB: Jul 29, 1945  Admit date:     04/18/2022  Discharge date: 05/07/22  Discharge Physician: Rickey Barbara   PCP: Aida Puffer, MD   Recommendations at discharge:   Follow up with PCP In 1-2 weeks as needed    Hospital Course: PMH of Parkinson's disease, bipolar disorder, CVA, hypothyroidism, dementia, Warnicke's syndrome. Presented to the hospital with passing out event. Currently significantly confused and drowsy. Unable to swallow safely. Palliative care was consulted.  Assessment and Plan: Syncope. Unwitnessed fall. Telemetry so far negative. CT head unremarkable. Echocardiogram shows preserved EF.  No valvular abnormalities. Patient does have some artifacts on telemetry which appears to be PSVT but this is actually tremors as QRS complex can be seen marching through this SVTs with the same rate when he does not have any SVT. -Patient's blood pressure has been stable in the hospital -There is concern that patient may have passed out because of orthostatic hypotension -Propranolol and amlodipine was discontinued; will not restart these medications on discharge   Dementia with agitation. Bipolar 1 disorder. History of Parkinson's disease. Continue Sinemet. -Patient is on dysphagia 1 diet, needs assistance with feeding   Dysphagia. Underwent MBS. Family's goal is to focus on comfort. -Patient later continued on dysphagia 1 diet   Agitation. Currently  off  restraints. Still has a Posey belt.  But wrist restraints were removed. Has been on Depakote for the same.  Depakote level is adequate. Continue current regimen for Depakote. Discontinue Namenda as it can increase agitation. Continue Zyprexa 7.5 mg twice daily.  In the past patient has been on 5 mg 3 times daily dose which was helpful.   Hypothyroidism. Continue Synthroid. Most recent TSH of 3.933 on 06/21/21   Goals of care  conversation. Palliative care was consulted. Palliative care's recommendation was initially to stabilize the patient and discharged to memory care with palliative care support. -On re-eval with PT, recs now for home health PT        Consultants: Palliative Care Procedures performed:   Disposition:  ALF Diet recommendation:  Dysphagia type 1 Nectar thick Liquid DISCHARGE MEDICATION: Allergies as of 05/07/2022   No Known Allergies      Medication List     STOP taking these medications    amLODipine 5 MG tablet Commonly known as: NORVASC   divalproex 250 MG DR tablet Commonly known as: DEPAKOTE Replaced by: divalproex 125 MG capsule   divalproex 500 MG DR tablet Commonly known as: DEPAKOTE Replaced by: divalproex 125 MG capsule   memantine 5 MG tablet Commonly known as: NAMENDA   propranolol 10 MG tablet Commonly known as: INDERAL       TAKE these medications    acetaminophen 325 MG tablet Commonly known as: TYLENOL Take 2 tablets (650 mg total) by mouth every 6 (six) hours as needed for mild pain (or Fever >/= 101).   bisacodyl 10 MG suppository Commonly known as: DULCOLAX Place 1 suppository (10 mg total) rectally daily as needed for moderate constipation.   carbidopa-levodopa 25-100 MG tablet Commonly known as: SINEMET IR Take 0.5 tablets by mouth 3 (three) times daily.   divalproex 125 MG capsule Commonly known as: DEPAKOTE SPRINKLE Take 3 capsules (375 mg total) by mouth 2 (two) times daily. Taken at 4pm and 10pm Replaces: divalproex 500 MG DR tablet   divalproex 125 MG capsule Commonly known as: DEPAKOTE SPRINKLE Take 4 capsules (500 mg total) by mouth daily. Taken at 10am  Start taking on: May 08, 2022 Replaces: divalproex 250 MG DR tablet   guaifenesin 100 MG/5ML syrup Commonly known as: ROBITUSSIN Take 200 mg by mouth 4 (four) times daily as needed for cough.   lactobacillus acidophilus Tabs tablet Take 2 tablets by mouth 3 (three) times  daily.   levothyroxine 75 MCG tablet Commonly known as: SYNTHROID Take 75 mcg by mouth daily before breakfast.   loperamide 2 MG capsule Commonly known as: IMODIUM Take 2 mg by mouth daily as needed for diarrhea or loose stools.   magnesium hydroxide 400 MG/5ML suspension Commonly known as: MILK OF MAGNESIA Take 30 mLs by mouth at bedtime as needed for mild constipation.   Mi-Acid 200-200-20 MG/5ML suspension Generic drug: alum & mag hydroxide-simeth Take 30 mLs by mouth every 6 (six) hours as needed for indigestion or heartburn.   multivitamin with minerals Tabs tablet Take 1 tablet by mouth daily.   neomycin-bacitracin-polymyxin [phone] ointment Apply 1 Application topically daily as needed (wound care).   OLANZapine 15 MG tablet Commonly known as: ZYPREXA Take 7.5 mg by mouth 2 (two) times daily.   polyethylene glycol 17 g packet Commonly known as: MIRALAX / GLYCOLAX Take 17 g by mouth daily. Start taking on: May 08, 2022   thiamine 100 MG tablet Commonly known as: VITAMIN B1 Take 1 tablet (100 mg total) by mouth daily.        Follow-up Information     Little, James, MD Follow up in 1 week(s).   Specialty: Family Medicine Why: Hospital follow up Contact information: [address] [place] [address] [phone]                Discharge Exam: Ceasar Mons Weights   04/24/22 1241  Weight: 62.1 kg   General exam: Awake, laying in bed, in nad Respiratory system: Normal respiratory effort, no wheezing Cardiovascular system: regular rate, s1, s2 Gastrointestinal system: Soft, nondistended, positive BS Central nervous system: CN2-12 grossly intact, strength intact Extremities: Perfused, no clubbing Skin: Normal skin turgor, no notable skin lesions seen Psychiatry: Mood normal // no visual hallucinations   Condition at discharge: fair  The results of significant diagnostics from this hospitalization (including imaging, microbiology, ancillary and  laboratory) are listed below for reference.   Imaging Studies: DG CHEST PORT 1 VIEW  Result Date: 04/21/2022 CLINICAL DATA:  Shortness of breath and cough. Patient had swallow study performed yesterday with aspiration noted. Patient has cough and dyspnea today. EXAM: PORTABLE CHEST 1 VIEW COMPARISON:  Chest radiograph 04/18/2022 FINDINGS: The heart size and mediastinal contours are within normal limits. Aortic calcification. Both lungs are clear. No pleural effusion or pneumothorax. The visualized skeletal structures are unremarkable. Residual oral contrast is noted in the distal ascending and transverse colon. Gaseous distention of multiple normal caliber loops of small bowel and colon in the visualized upper abdomen. IMPRESSION: No acute cardiopulmonary abnormality. Electronically Signed   By: Sherron Ales M.D.   On: 04/21/2022 14:28   DG Abd Portable 1V  Result Date: 04/21/2022 CLINICAL DATA:  Constipation EXAM: PORTABLE ABDOMEN - 1 VIEW COMPARISON:  06/27/2021 FINDINGS: Contrast from swallowing function study yesterday is present within the colon. Nondistended gas-filled loops of small bowel and colon are present. No bowel distension or suspicious calcifications noted. IMPRESSION: Nonspecific nonobstructive bowel gas pattern. Electronically Signed   By: Harmon Pier M.D.   On: 04/21/2022 14:26   DG Swallowing Func-Speech Pathology  Result Date: 04/20/2022 Table formatting from the original result was not included.  Objective Swallowing Evaluation: Type of Study: MBS-Modified Barium Swallow Study  Patient Details Name: Sholom Dulude MRN: [id number] Date of Birth: 29-Dec-1945 Today's Date: 04/20/2022 Time: SLP Start Time (ACUTE ONLY): 0850 -SLP Stop Time (ACUTE ONLY): 0915 SLP Time Calculation (min) (ACUTE ONLY): 25 min Past Medical History: Past Medical History: Diagnosis Date  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HCC)   Bipolar 1 disorder (HCC)   Cerebellar disorder   Epilepsy (HCC)   Hypothyroid    Insomnia   Other postablative hypothyroidism   Stutter   Tremor   Unspecified condition of brain   Wernicke's encephalopathy  Past Surgical History: Past Surgical History: Procedure Laterality Date  NO PAST SURGERIES   HPI: Dryden Tapley is a 77 y.o. male with medical history significant for dementia, bipolar 1 disorder, epilepsy, CVA, hypothyroidism, who presented to [facility] ED from SNF after passing out. pt has h/o dementia.  He was found on the ground at [facility].  His fall was unwitnessed.  EMS was activated, 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HCC) Bipolar 1 disorder Cerebellar disorder Epilepsy (HCC) Hypothyroid Insomnia Other postablative hypothyroidism Stutter Tremor Unspecified condition of brain Wernicke's encephalopathy carbidopa levadopa .  Pt has h/o Multilevel moderate degenerative changes of the spine. Pt has been previously seen by SLP during prior hospital admission and progressed to tolerating dys3/thin diet with self feeding.  No family present at this time.  Subjective: pt awake in chair  Recommendations for follow up therapy are one component of a multi-disciplinary discharge planning process, led by the attending physician.  Recommendations may be updated based on patient status, additional functional criteria and insurance authorization. Assessment / Plan / Recommendation   04/20/2022  10:00 AM Clinical Impressions Clinical Impression Pt presents with severe oral and moderate pharyngeal dysphagia with sensorimotor deficits. Prolonged oral holding without oral transiting - thus no pharyngeal swallow observed with small liquid boluses as well with oral residuals. SLP provided oral suction before, during and after his MBS study to clear secretions that he is not swallowing. Pharyngeal swallow is delayed with boluses pooling to pyriform sinus prior to swallow triggering.  Pharyngeal swallow is marginally weak with mild-mod retention that mixes with copious secretions.  Aspiration of thin from delayed  swallow noted as well as aspiration after the swallow from copious secretions mixing with barium and spilling into open airway. Pt with initial cough aspiration but this did not clear it and further aspiration episodes were not followed by a cough.  Mr Farrington does begin vocalizing with aspiration -which SLP suspects is his body's way of trying to clear his airway.  He unfortunately does not follow directions due to his dementia.  He is aspirating secretions overtly and will aspirate regardless of care plan and his dysphagia does not allow adequacy of nutrition.  Reasonable plan in this SLPs opinion is to consider comfort po with known aspiration.  Pending plan, would recommend to continue crushed medications (if not contraindicated) with nectar - and oral suction before, during and after swallowing. SLP will follow up for family education as indicated but given pt's dementia, pt unable to participate in swallow rehab.  Hopefully his swallow will improve marginally with recovery from his current illness - but prognosis for swallow is guarded.     SLP Visit Diagnosis Dysphagia, oropharyngeal phase (R13.12) Impact on safety and function Severe aspiration risk;Risk for inadequate nutrition/hydration     04/20/2022  10:00 AM Treatment Recommendations Treatment Recommendations Therapy as outlined in treatment plan below     04/20/2022  10:01 AM Prognosis  Prognosis for Safe Diet Advancement Guarded Barriers to Reach Goals Severity of deficits;Time post onset   04/20/2022  10:00 AM Diet Recommendations SLP Diet Recommendations NPO     04/20/2022  10:00 AM Other Recommendations Other Recommendations Have oral suction available Follow Up Recommendations Follow physician's recommendations for discharge plan and follow up therapies Functional Status Assessment Patient has had a recent decline in their functional status and/or demonstrates limited ability to make significant improvements in function in a reasonable and  predictable amount of time   04/20/2022  10:00 AM Frequency and Duration  Speech Therapy Frequency (ACUTE ONLY) min 1 x/week Treatment Duration 2 weeks     04/20/2022   9:51 AM Oral Phase Oral Phase Impaired Oral - Nectar Teaspoon Weak lingual manipulation;Other (Comment);Reduced posterior propulsion;Pocketing in anterior sulcus;Delayed oral transit;Holding of bolus;Decreased bolus cohesion Oral - Nectar Cup Other (Comment);Weak lingual manipulation;Reduced posterior propulsion;Pocketing in anterior sulcus;Incomplete tongue to palate contact;Delayed oral transit;Holding of bolus;Decreased bolus cohesion;Premature spillage Oral - Nectar Straw Other (Comment);Weak lingual manipulation;Lingual/palatal residue;Reduced posterior propulsion;Pocketing in anterior sulcus;Incomplete tongue to palate contact;Decreased velopharyngeal closure;Holding of bolus;Delayed oral transit;Premature spillage;Decreased bolus cohesion Oral - Thin Teaspoon Other (Comment);Weak lingual manipulation;Lingual/palatal residue;Reduced posterior propulsion;Pocketing in anterior sulcus;Incomplete tongue to palate contact;Delayed oral transit;Premature spillage;Decreased bolus cohesion Oral - Thin Cup Weak lingual manipulation;Lingual/palatal residue;Premature spillage;Reduced posterior propulsion;Pocketing in anterior sulcus;Incomplete tongue to palate contact;Decreased bolus cohesion;Delayed oral transit Oral - Thin Straw Weak lingual manipulation;Lingual/palatal residue;Premature spillage;Reduced posterior propulsion;Pocketing in anterior sulcus;Incomplete tongue to palate contact;Delayed oral transit;Decreased bolus cohesion Oral - Puree Weak lingual manipulation;Lingual/palatal residue;Premature spillage;Reduced posterior propulsion;Incomplete tongue to palate contact;Delayed oral transit;Decreased bolus cohesion Oral - Mech Soft Weak lingual manipulation;Impaired mastication;Lingual/palatal residue;Premature spillage;Reduced posterior  propulsion;Incomplete tongue to palate contact;Delayed oral transit;Decreased bolus cohesion Oral Phase - Comment despite counting cues and placement of bolus on left side of oral cavity, pt did not consistently elicit swallow, severely dystonic lingual movement noted with gross oral retention of secretions mixed with barium    04/20/2022   9:56 AM Pharyngeal Phase Pharyngeal Phase Impaired Pharyngeal- Nectar Teaspoon Delayed swallow initiation-pyriform sinuses;Lateral channel residue;Reduced airway/laryngeal closure;Reduced laryngeal elevation;Pharyngeal residue - pyriform Pharyngeal Material does not enter airway Pharyngeal- Nectar Cup Delayed swallow initiation-pyriform sinuses;Penetration/Apiration after swallow;Inter-arytenoid space residue;Reduced laryngeal elevation;Reduced airway/laryngeal closure;Pharyngeal residue - pyriform Pharyngeal Material enters airway, passes BELOW cords without attempt by patient to eject out (silent aspiration) Pharyngeal- Nectar Straw Delayed swallow initiation-pyriform sinuses;Reduced laryngeal elevation;Reduced airway/laryngeal closure;Lateral channel residue;Pharyngeal residue - pyriform Pharyngeal Material enters airway, passes BELOW cords without attempt by patient to eject out (silent aspiration) Pharyngeal- Thin Teaspoon -- Pharyngeal- Thin Cup Delayed swallow initiation-pyriform sinuses;Lateral channel residue;Reduced laryngeal elevation;Reduced airway/laryngeal closure;Pharyngeal residue - pyriform Pharyngeal Material enters airway, passes BELOW cords without attempt by patient to eject out (silent aspiration);Material enters airway, passes BELOW cords and not ejected out despite cough attempt by patient Pharyngeal- Thin Straw Delayed swallow initiation-pyriform sinuses;Reduced laryngeal elevation;Reduced airway/laryngeal closure;Pharyngeal residue - pyriform;Lateral channel residue Pharyngeal Material enters airway, passes BELOW cords without attempt by patient to eject  out (silent aspiration) Pharyngeal- Puree Delayed swallow initiation-vallecula;Pharyngeal residue - valleculae Pharyngeal Material does not enter airway Pharyngeal- Mechanical Soft Delayed swallow initiation-vallecula;Pharyngeal residue - valleculae Pharyngeal Material does not enter airway    04/20/2022   9:59 AM Cervical Esophageal Phase  Cervical Esophageal Phase Impaired Cervical Esophageal Comment impaired clearance into cervical esophagus Rolena Infanteammy K, MS [facility] SLP Acute Rehab Services Office [phone] Pager [phone] Chales AbrahamsKimball, Tamara Ann 04/20/2022, 10:02 AM  ECHOCARDIOGRAM COMPLETE  Result Date: 04/19/2022    ECHOCARDIOGRAM REPORT   Patient Name:   HASSAN BLACKSHIRE Date of Exam: 04/19/2022 Medical Rec #:  [id number]      Height:       67.0 in Accession #:    [id number]     Weight:       215.8 lb Date of Birth:  [birth year]      BSA:          2.089 m Patient Age:    76 years       BP:           137/61 mmHg Patient Gender: M              HR:           69 bpm. Exam Location:  Inpatient Procedure: 2D Echo, Cardiac Doppler and Color Doppler Indications:    R55 Syncope  History:        Patient has no prior history of Echocardiogram examinations.                 Signs/Symptoms:Syncope.  Sonographer:    Eulah Pont RDCS Referring Phys: [id number] Darlin Drop  Sonographer Comments: Image acquisition challenging due to uncooperative patient. IMPRESSIONS  1. Left ventricular ejection fraction, by estimation, is 60 to 65%. The left ventricle has normal function. The left ventricle has no regional wall motion abnormalities. Left ventricular diastolic parameters are indeterminate.  2. Right ventricular systolic function is normal. The right ventricular size is normal. Tricuspid regurgitation signal is inadequate for assessing PA pressure.  3. The mitral valve is normal in structure. No evidence of mitral valve regurgitation.  4. The aortic valve was not well visualized. Aortic valve regurgitation is  not visualized. Aortic valve sclerosis/calcification is present, without any evidence of aortic stenosis.  5. Aortic dilatation noted. There is dilatation of the ascending aorta, measuring 40 mm.  6. The inferior vena cava is normal in size with greater than 50% respiratory variability, suggesting right atrial pressure of 3 mmHg. FINDINGS  Left Ventricle: Left ventricular ejection fraction, by estimation, is 60 to 65%. The left ventricle has normal function. The left ventricle has no regional wall motion abnormalities. The left ventricular internal cavity size was normal in size. There is  no left ventricular hypertrophy. Left ventricular diastolic parameters are indeterminate. Right Ventricle: The right ventricular size is normal. No increase in right ventricular wall thickness. Right ventricular systolic function is normal. Tricuspid regurgitation signal is inadequate for assessing PA pressure. Left Atrium: Left atrial size was normal in size. Right Atrium: Right atrial size was normal in size. Pericardium: There is no evidence of pericardial effusion. Mitral Valve: The mitral valve is normal in structure. No evidence of mitral valve regurgitation. Tricuspid Valve: The tricuspid valve is normal in structure. Tricuspid valve regurgitation is trivial. Aortic Valve: The aortic valve was not well visualized. Aortic valve regurgitation is not visualized. Aortic valve sclerosis/calcification is present, without any evidence of aortic stenosis. Pulmonic Valve: The pulmonic valve was not well visualized. Pulmonic valve regurgitation is not visualized. Aorta: The aortic root is normal in size and structure and aortic dilatation noted. There is dilatation of the ascending aorta, measuring 40 mm. Venous: The inferior vena cava is normal in size with greater than 50% respiratory variability, suggesting right atrial pressure of 3 mmHg. IAS/Shunts: The interatrial septum was not well visualized.  LEFT VENTRICLE PLAX 2D LVIDd:          4.30 cm  Diastology LVIDs:         2.60 cm     LV e' medial:    6.40 cm/s LV PW:         1.00 cm     LV E/e' medial:  10.6 LV IVS:        0.80 cm     LV e' lateral:   7.45 cm/s LVOT diam:     2.20 cm     LV E/e' lateral: 9.1 LV SV:         103 LV SV Index:   49 LVOT Area:     3.80 cm  LV Volumes (MOD) LV vol d, MOD A2C: 98.8 ml LV vol d, MOD A4C: 87.8 ml LV vol s, MOD A2C: 37.4 ml LV vol s, MOD A4C: 31.6 ml LV SV MOD A2C:     61.4 ml LV SV MOD A4C:     87.8 ml LV SV MOD BP:      59.9 ml RIGHT VENTRICLE RV S prime:     11.70 cm/s TAPSE (M-mode): 2.5 cm LEFT ATRIUM             Index        RIGHT ATRIUM           Index LA diam:        3.00 cm 1.44 cm/m   RA Area:     13.50 cm LA Vol (A2C):   27.4 ml 13.12 ml/m  RA Volume:   28.70 ml  13.74 ml/m LA Vol (A4C):   27.9 ml 13.36 ml/m LA Biplane Vol: 29.5 ml 14.12 ml/m  AORTIC VALVE LVOT Vmax:   124.00 cm/s LVOT Vmean:  69.800 cm/s LVOT VTI:    0.271 m  AORTA Ao Root diam: 3.50 cm Ao Asc diam:  4.00 cm MITRAL VALVE MV Area (PHT): 3.89 cm    SHUNTS MV Decel Time: 195 msec    Systemic VTI:  0.27 m MV E velocity: 67.70 cm/s  Systemic Diam: 2.20 cm MV A velocity: 68.60 cm/s MV E/A ratio:  0.99 Oswaldo Milian MD Electronically signed by Oswaldo Milian MD Signature Date/Time: 04/19/2022/2:25:48 PM    Final    CT Maxillofacial Wo Contrast  Result Date: 04/18/2022 CLINICAL DATA:  Facial trauma, blunt; Head trauma, moderate-severe; Neck trauma (Age >= 65y) EXAM: CT HEAD WITHOUT CONTRAST CT MAXILLOFACIAL WITHOUT CONTRAST CT CERVICAL SPINE WITHOUT CONTRAST TECHNIQUE: Multidetector CT imaging of the head, cervical spine, and maxillofacial structures were performed using the standard protocol without intravenous contrast. Multiplanar CT image reconstructions of the cervical spine and maxillofacial structures were also generated. RADIATION DOSE REDUCTION: This exam was performed according to the departmental dose-optimization program which includes  automated exposure control, adjustment of the mA and/or kV according to patient size and/or use of iterative reconstruction technique. COMPARISON:  CT head 06/17/2021, CT cervical spine 05/12/2020 FINDINGS: CT HEAD FINDINGS Brain: Cerebral ventricle sizes are concordant with the degree of cerebral volume loss. Patchy and confluent areas of decreased attenuation are noted throughout the deep and periventricular white matter of the cerebral hemispheres bilaterally, compatible with chronic microvascular ischemic disease. No evidence of large-territorial acute infarction. No parenchymal hemorrhage. No mass lesion. No extra-axial collection. No mass effect or midline shift. No hydrocephalus. Basilar cisterns are patent. Vascular: No hyperdense vessel. Atherosclerotic calcifications are present within the cavernous internal carotid and vertebral arteries. Skull: No acute fracture or focal lesion. Other: None. CT MAXILLOFACIAL FINDINGS Osseous: No fracture or mandibular dislocation. No destructive process.  Sinuses/Orbits: Bilateral maxillary mucosal thickening. Otherwise paranasal sinuses and mastoid air cells are clear. Bilateral lens replacement. Otherwise the orbits are unremarkable. Soft tissues: Right maxillary subcutaneus soft tissue hematoma formation. CT CERVICAL SPINE FINDINGS Alignment: Straightening of the normal cervical lordosis likely due to positioning and degenerative changes. Stable grade 1 anterolisthesis of C7 on T1. Stable grade 1 anterolisthesis of C2 on C3. Skull base and vertebrae: Multilevel moderate degenerative changes of the spine. Associated at least moderate multilevel osseous neural foraminal stenosis. No severe osseous central canal stenosis. No acute fracture. No aggressive appearing focal osseous lesion or focal pathologic process. Soft tissues and spinal canal: No prevertebral fluid or swelling. No visible canal hematoma. Upper chest: Unremarkable. Other: Atherosclerotic plaque of the  carotid arteries within the neck. IMPRESSION: 1. No acute intracranial abnormality. 2. No acute displaced facial fracture. 3. No acute displaced fracture or traumatic listhesis of the cervical spine. 4. 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 Associated at least moderate multilevel osseous neural foraminal stenosis. Electronically Signed   By: Tish Frederickson M.D.   On: 04/18/2022 18:42   CT Head Wo Contrast  Result Date: 04/18/2022 CLINICAL DATA:  Facial trauma, blunt; Head trauma, moderate-severe; Neck trauma (Age >= 65y) EXAM: CT HEAD WITHOUT CONTRAST CT MAXILLOFACIAL WITHOUT CONTRAST CT CERVICAL SPINE WITHOUT CONTRAST TECHNIQUE: Multidetector CT imaging of the head, cervical spine, and maxillofacial structures were performed using the standard protocol without intravenous contrast. Multiplanar CT image reconstructions of the cervical spine and maxillofacial structures were also generated. RADIATION DOSE REDUCTION: This exam was performed according to the departmental dose-optimization program which includes automated exposure control, adjustment of the mA and/or kV according to patient size and/or use of iterative reconstruction technique. COMPARISON:  CT head 06/17/2021, CT cervical spine 05/12/2020 FINDINGS: CT HEAD FINDINGS Brain: Cerebral ventricle sizes are concordant with the degree of cerebral volume loss. Patchy and confluent areas of decreased attenuation are noted throughout the deep and periventricular white matter of the cerebral hemispheres bilaterally, compatible with chronic microvascular ischemic disease. No evidence of large-territorial acute infarction. No parenchymal hemorrhage. No mass lesion. No extra-axial collection. No mass effect or midline shift. No hydrocephalus. Basilar cisterns are patent. Vascular: No hyperdense vessel. Atherosclerotic calcifications are present within the cavernous internal carotid and vertebral arteries. Skull: No acute fracture or focal  lesion. Other: None. CT MAXILLOFACIAL FINDINGS Osseous: No fracture or mandibular dislocation. No destructive process. Sinuses/Orbits: Bilateral maxillary mucosal thickening. Otherwise paranasal sinuses and mastoid air cells are clear. Bilateral lens replacement. Otherwise the orbits are unremarkable. Soft tissues: Right maxillary subcutaneus soft tissue hematoma formation. CT CERVICAL SPINE FINDINGS Alignment: Straightening of the normal cervical lordosis likely due to positioning and degenerative changes. Stable grade 1 anterolisthesis of C7 on T1. Stable grade 1 anterolisthesis of C2 on C3. Skull base and vertebrae: Multilevel moderate degenerative changes of the spine. Associated at least moderate multilevel osseous neural foraminal stenosis. No severe osseous central canal stenosis. No acute fracture. No aggressive appearing focal osseous lesion or focal pathologic process. Soft tissues and spinal canal: No prevertebral fluid or swelling. No visible canal hematoma. Upper chest: Unremarkable. Other: Atherosclerotic plaque of the carotid arteries within the neck. IMPRESSION: 1. No acute intracranial abnormality. 2. No acute displaced facial fracture. 3. No acute displaced fracture or traumatic listhesis of the cervical spine. 4. 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 Associated at least moderate multilevel osseous neural foraminal stenosis. Electronically Signed   By: Tish Frederickson M.D.   On: 04/18/2022 18:42  CT Cervical Spine Wo Contrast  Result Date: 04/18/2022 CLINICAL DATA:  Facial trauma, blunt; Head trauma, moderate-severe; Neck trauma (Age >= 65y) EXAM: CT HEAD WITHOUT CONTRAST CT MAXILLOFACIAL WITHOUT CONTRAST CT CERVICAL SPINE WITHOUT CONTRAST TECHNIQUE: Multidetector CT imaging of the head, cervical spine, and maxillofacial structures were performed using the standard protocol without intravenous contrast. Multiplanar CT image reconstructions of the cervical spine and  maxillofacial structures were also generated. RADIATION DOSE REDUCTION: This exam was performed according to the departmental dose-optimization program which includes automated exposure control, adjustment of the mA and/or kV according to patient size and/or use of iterative reconstruction technique. COMPARISON:  CT head 06/17/2021, CT cervical spine 05/12/2020 FINDINGS: CT HEAD FINDINGS Brain: Cerebral ventricle sizes are concordant with the degree of cerebral volume loss. Patchy and confluent areas of decreased attenuation are noted throughout the deep and periventricular white matter of the cerebral hemispheres bilaterally, compatible with chronic microvascular ischemic disease. No evidence of large-territorial acute infarction. No parenchymal hemorrhage. No mass lesion. No extra-axial collection. No mass effect or midline shift. No hydrocephalus. Basilar cisterns are patent. Vascular: No hyperdense vessel. Atherosclerotic calcifications are present within the cavernous internal carotid and vertebral arteries. Skull: No acute fracture or focal lesion. Other: None. CT MAXILLOFACIAL FINDINGS Osseous: No fracture or mandibular dislocation. No destructive process. Sinuses/Orbits: Bilateral maxillary mucosal thickening. Otherwise paranasal sinuses and mastoid air cells are clear. Bilateral lens replacement. Otherwise the orbits are unremarkable. Soft tissues: Right maxillary subcutaneus soft tissue hematoma formation. CT CERVICAL SPINE FINDINGS Alignment: Straightening of the normal cervical lordosis likely due to positioning and degenerative changes. Stable grade 1 anterolisthesis of C7 on T1. Stable grade 1 anterolisthesis of C2 on C3. Skull base and vertebrae: Multilevel moderate degenerative changes of the spine. Associated at least moderate multilevel osseous neural foraminal stenosis. No severe osseous central canal stenosis. No acute fracture. No aggressive appearing focal osseous lesion or focal pathologic  process. Soft tissues and spinal canal: No prevertebral fluid or swelling. No visible canal hematoma. Upper chest: Unremarkable. Other: Atherosclerotic plaque of the carotid arteries within the neck. IMPRESSION: 1. No acute intracranial abnormality. 2. No acute displaced facial fracture. 3. No acute displaced fracture or traumatic listhesis of the cervical spine. 4. 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 Associated at least moderate multilevel osseous neural foraminal stenosis. Electronically Signed   By: Tish FredericksonMorgane  Naveau M.D.   On: 04/18/2022 18:42   DG Chest Portable 1 View  Result Date: 04/18/2022 CLINICAL DATA:  fall EXAM: PORTABLE CHEST 1 VIEW COMPARISON:  Chest x-ray 06/18/2021 FINDINGS: The heart and mediastinal contours are unchanged. Aortic calcification. No focal consolidation. No pulmonary edema. No pleural effusion. No pneumothorax. No acute osseous abnormality. IMPRESSION: 1. No active disease. 2.  Aortic Atherosclerosis (ICD10-I70.0). Electronically Signed   By: Tish FredericksonMorgane  Naveau M.D.   On: 04/18/2022 17:21    Microbiology: Results for orders placed or performed during the hospital encounter of 04/18/22  Resp panel by RT-PCR (RSV, Flu A&B, Covid) Anterior Nasal Swab     Status: None   Collection Time: 04/19/22  3:21 AM   Specimen: Anterior Nasal Swab  Result Value Ref Range Status   SARS Coronavirus 2 by RT PCR NEGATIVE NEGATIVE Final    Comment: (NOTE) SARS-CoV-2 target nucleic acids are NOT DETECTED.  The SARS-CoV-2 RNA is generally detectable in upper respiratory specimens during the acute phase of infection. The lowest concentration of SARS-CoV-2 viral copies this assay can detect is 138 copies/mL. A negative result does not preclude SARS-Cov-2  infection and should not be used as the sole basis for treatment or other patient management decisions. A negative result may occur with  improper specimen collection/handling, submission of specimen other than  nasopharyngeal swab, presence of viral mutation(s) within the areas targeted by this assay, and inadequate number of viral copies(<138 copies/mL). A negative result must be combined with clinical observations, patient history, and epidemiological information. The expected result is Negative.  Fact Sheet for Patients:  EntrepreneurPulse.com.au  Fact Sheet for Healthcare Providers:  IncredibleEmployment.be  This test is no t yet approved or cleared by the [place] FDA and  has been authorized for detection and/or diagnosis of SARS-CoV-2 by FDA under an Emergency Use Authorization (EUA). This EUA will remain  in effect (meaning this test can be used) for the duration of the COVID-19 declaration under Section 564(b)(1) of the Act, 21 U.S.C.section 360bbb-3(b)(1), unless the authorization is terminated  or revoked sooner.       Influenza A by PCR NEGATIVE NEGATIVE Final   Influenza B by PCR NEGATIVE NEGATIVE Final    Comment: (NOTE) The Xpert Xpress SARS-CoV-2/FLU/RSV plus assay is intended as an aid in the diagnosis of influenza from Nasopharyngeal swab specimens and should not be used as a sole basis for treatment. Nasal washings and aspirates are unacceptable for Xpert Xpress SARS-CoV-2/FLU/RSV testing.  Fact Sheet for Patients: EntrepreneurPulse.com.au  Fact Sheet for Healthcare Providers: IncredibleEmployment.be  This test is not yet approved or cleared by the [place] FDA and has been authorized for detection and/or diagnosis of SARS-CoV-2 by FDA under an Emergency Use Authorization (EUA). This EUA will remain in effect (meaning this test can be used) for the duration of the COVID-19 declaration under Section 564(b)(1) of the Act, 21 U.S.C. section 360bbb-3(b)(1), unless the authorization is terminated or revoked.     Resp Syncytial Virus by PCR NEGATIVE NEGATIVE Final    Comment:  (NOTE) Fact Sheet for Patients: EntrepreneurPulse.com.au  Fact Sheet for Healthcare Providers: IncredibleEmployment.be  This test is not yet approved or cleared by the [place] FDA and has been authorized for detection and/or diagnosis of SARS-CoV-2 by FDA under an Emergency Use Authorization (EUA). This EUA will remain in effect (meaning this test can be used) for the duration of the COVID-19 declaration under Section 564(b)(1) of the Act, 21 U.S.C. section 360bbb-3(b)(1), unless the authorization is terminated or revoked.  Performed at [facility], [place] [address], [place], [place] [address]   Urine Culture     Status: None   Collection Time: 04/19/22  3:30 AM   Specimen: Urine, Clean Catch  Result Value Ref Range Status   Specimen Description   Final    URINE, CLEAN CATCH Performed at [facility], [place] [address], [place], [place] [address]    Special Requests   Final    NONE Performed at [facility], [place] [address], [place], [place] [address]    Culture   Final    NO GROWTH Performed at [facility], [place] [address], [place], [place] [address]    Report Status 04/20/2022 FINAL  Final  MRSA Next Gen by PCR, Nasal     Status: None   Collection Time: 04/19/22  9:30 PM   Specimen: Nasal Mucosa; Nasal Swab  Result Value Ref Range Status   MRSA by PCR Next Gen NOT DETECTED NOT DETECTED Final    Comment: (NOTE) The GeneXpert MRSA Assay (FDA approved for NASAL specimens only), is one component  of a comprehensive MRSA colonization surveillance program. It is not intended to diagnose MRSA infection nor to guide or monitor treatment for MRSA infections. Test performance is not FDA approved in patients less than 75 years old. Performed at [facility], [address] [place], [place] [id number]     Labs: CBC: Recent Labs  Lab 05/02/22 [phone] [id number]  05/03/22 0546 05/04/22 0643 05/05/22 0554 05/06/22 0559  WBC 7.4 8.8 7.7 9.6 10.5  HGB 12.0* 12.1* 12.6* 13.5 13.9  HCT 36.7* 36.6* 38.6* 41.5 42.2  MCV 99.7 99.7 99.7 100.0 100.2*  PLT 264 265 269 292 293   Basic Metabolic Panel: Recent Labs  Lab 05/02/22 0605 05/03/22 0546 05/04/22 0643 05/05/22 0554 05/06/22 0559  NA 142 139 140 140 137  K 4.7 4.5 4.4 4.6 3.6  CL 104 104 103 101 98  CO2 32 30 33* 33* 31  GLUCOSE 85 86 89 89 77  BUN 20 23 17 22 23   CREATININE 0.67 0.67 0.68 0.64 0.60*  CALCIUM 9.5 9.2 9.4 9.8 9.7   Liver Function Tests: No results for input(s): "AST", "ALT", "ALKPHOS", "BILITOT", "PROT", "ALBUMIN" in the last 168 hours. CBG: No results for input(s): "GLUCAP" in the last 168 hours.  Discharge time spent: less than 30 minutes.  Signed: Rickey BarbaraStephen Keilen Kahl, MD [facility] 05/07/2022

## 2022-06-30 ENCOUNTER — Emergency Department (HOSPITAL_COMMUNITY)
Admission: EM | Admit: 2022-06-30 | Discharge: 2022-06-30 | Disposition: A | Discharge status: Home / Self Care | Payer: Medicare Other | Attending: Emergency Medicine | Admitting: Emergency Medicine

## 2022-06-30 ENCOUNTER — Emergency Department (HOSPITAL_COMMUNITY): Payer: Medicare Other

## 2022-06-30 ENCOUNTER — Other Ambulatory Visit: Payer: Self-pay

## 2022-06-30 DIAGNOSIS — F0390 Unspecified dementia without behavioral disturbance: Secondary | ICD-10-CM | POA: Diagnosis not present

## 2022-06-30 DIAGNOSIS — Y92002 Bathroom of unspecified non-institutional (private) residence single-family (private) house as the place of occurrence of the external cause: Secondary | ICD-10-CM | POA: Diagnosis not present

## 2022-06-30 DIAGNOSIS — S01111A Laceration without foreign body of right eyelid and periocular area, initial encounter: Secondary | ICD-10-CM | POA: Diagnosis not present

## 2022-06-30 DIAGNOSIS — Z23 Encounter for immunization: Secondary | ICD-10-CM | POA: Insufficient documentation

## 2022-06-30 DIAGNOSIS — W1839XA Other fall on same level, initial encounter: Secondary | ICD-10-CM | POA: Diagnosis not present

## 2022-06-30 DIAGNOSIS — S0993XA Unspecified injury of face, initial encounter: Secondary | ICD-10-CM | POA: Diagnosis present

## 2022-06-30 DIAGNOSIS — W19XXXA Unspecified fall, initial encounter: Secondary | ICD-10-CM

## 2022-06-30 LAB — VALPROIC ACID LEVEL: Valproic Acid Lvl: 43 ug/mL — ABNORMAL LOW (ref 50.0–100.0)

## 2022-06-30 LAB — CBC WITH DIFFERENTIAL/PLATELET
Abs Immature Granulocytes: 0.04 10*3/uL (ref 0.00–0.07)
Basophils Absolute: 0 10*3/uL (ref 0.0–0.1)
Basophils Relative: 0 %
Eosinophils Absolute: 0 10*3/uL (ref 0.0–0.5)
Eosinophils Relative: 0 %
HCT: 37.8 % — ABNORMAL LOW (ref 39.0–52.0)
Hemoglobin: 12.3 g/dL — ABNORMAL LOW (ref 13.0–17.0)
Immature Granulocytes: 1 %
Lymphocytes Relative: 27 %
Lymphs Abs: 2.4 10*3/uL (ref 0.7–4.0)
MCH: 33 pg (ref 26.0–34.0)
MCHC: 32.5 g/dL (ref 30.0–36.0)
MCV: 101.3 fL — ABNORMAL HIGH (ref 80.0–100.0)
Monocytes Absolute: 0.7 10*3/uL (ref 0.1–1.0)
Monocytes Relative: 7 %
Neutro Abs: 5.7 10*3/uL (ref 1.7–7.7)
Neutrophils Relative %: 65 %
Platelets: 222 10*3/uL (ref 150–400)
RBC: 3.73 MIL/uL — ABNORMAL LOW (ref 4.22–5.81)
RDW: 13.1 % (ref 11.5–15.5)
WBC: 8.9 10*3/uL (ref 4.0–10.5)
nRBC: 0 % (ref 0.0–0.2)

## 2022-06-30 LAB — COMPREHENSIVE METABOLIC PANEL
ALT: 6 U/L (ref 0–44)
AST: 15 U/L (ref 15–41)
Albumin: 3.5 g/dL (ref 3.5–5.0)
Alkaline Phosphatase: 44 U/L (ref 38–126)
Anion gap: 9 (ref 5–15)
BUN: 28 mg/dL — ABNORMAL HIGH (ref 8–23)
CO2: 29 mmol/L (ref 22–32)
Calcium: 8.9 mg/dL (ref 8.9–10.3)
Chloride: 105 mmol/L (ref 98–111)
Creatinine, Ser: 0.82 mg/dL (ref 0.61–1.24)
GFR, Estimated: >60 mL/min (ref >60)
Glucose, Bld: 98 mg/dL (ref 70–99)
Potassium: 4.3 mmol/L (ref 3.5–5.1)
Sodium: 143 mmol/L (ref 135–145)
Total Bilirubin: 0.4 mg/dL (ref 0.3–1.2)
Total Protein: 6 g/dL — ABNORMAL LOW (ref 6.5–8.1)

## 2022-06-30 MED ORDER — LIDOCAINE-EPINEPHRINE (PF) 2 %-1:200000 IJ SOLN
20.0000 mL | Freq: Once | INTRAMUSCULAR | Status: AC
  Administered 2022-06-30: 20 mL
  Filled 2022-06-30: qty 20

## 2022-06-30 MED ORDER — TETANUS-DIPHTH-ACELL PERTUSSIS 5-2.5-18.5 LF-MCG/0.5 IM SUSY
0.5000 mL | PREFILLED_SYRINGE | Freq: Once | INTRAMUSCULAR | Status: AC
  Administered 2022-06-30: 0.5 mL via INTRAMUSCULAR
  Filled 2022-06-30: qty 0.5

## 2022-06-30 NOTE — ED Provider Notes (Signed)
  Physical Exam  BP 117/69 (BP Location: Left Arm)   Pulse (!) 57   Temp 98.1 F (36.7 C) (Rectal)   Resp 18   Ht 6' 2"$  (1.88 m)   Wt 62.1 kg   SpO2 100%   BMI 17.59 kg/m   Physical Exam  Procedures  .Marland KitchenLaceration Repair  Date/Time: 06/30/2022 1:38 PM  Performed by: Trevor Reason, PA-C Authorized by: Trevor Reason, PA-C   Consent:    Consent obtained: disoriented and unable to consent. Universal protocol:    Imaging studies available: yes     Immediately prior to procedure, a time out was called: yes     Patient identity confirmed:  Arm band Anesthesia:    Anesthesia method:  Local infiltration   Local anesthetic:  Lidocaine 1% WITH epi Laceration details:    Location:  Face   Face location:  R eyebrow   Length (cm):  4 Pre-procedure details:    Preparation:  Imaging obtained to evaluate for foreign bodies Exploration:    Hemostasis achieved with:  Epinephrine   Wound exploration: entire depth of wound visualized   Treatment:    Area cleansed with:  Povidone-iodine and Shur-Clens   Amount of cleaning:  Standard Skin repair:    Repair method:  Sutures   Suture size:  5-0   Wound skin closure material used: vicryl rapide.   Suture technique:  Simple interrupted   Number of sutures:  7 Approximation:    Approximation:  Close Repair type:    Repair type:  Simple Post-procedure details:    Dressing:  Open (no dressing)   Procedure completion:  Tolerated   ED Course / MDM    Medical Decision Making Amount and/or Complexity of Data Reviewed Labs: ordered. Radiology: ordered. ECG/medicine tests: ordered.  Risk Prescription drug management.  Facial laceration as repaired above at the request of Dr. Aleen Cook, Trevor Folk, PA-C 06/30/22 1340    Cook, Trevor Main, MD 06/30/22 [phone]

## 2022-06-30 NOTE — Discharge Instructions (Signed)
Follow-up for suture removal in 5 to 7 days.  Return to the ED with new or worsening symptoms.

## 2022-06-30 NOTE — ED Triage Notes (Signed)
Pt BIBA from [facility]. Pt has unwitnessed fall in bathroom. Pt has 4 cm lac on R eyebrow with small skin tears on bilat hands. Pt nonverbal at baseline. Pt usually ambulates independently.   Pt not on blood thinners  BP: 118 palp RR: 16 HR: 86 SPO2: 96 RA

## 2022-06-30 NOTE — ED Provider Notes (Signed)
[place] [facility] AT [facility] Provider Note   CSN: [place]:[id number] Arrival date & time: 06/30/22  1012     History  Chief Complaint  Patient presents with   Facial Laceration    fall   Fall    Trevor Cook is a 77 y.o. male.  Level 5 caveat for dementia.  Patient brought in from facility after unwitnessed fall.  He apparently fell in the bathroom and sustained a laceration to his right eyebrow.  It is believed he struck his head on a countertop.  Patient is nonverbal at baseline and not able to give any history.  No blood thinner use.  Noted to have right eyebrow laceration and multiple abrasions to bilateral dorsal hands.  No neck or back pain.  No chest pain or abdominal pain.  No focal weakness, numbness or tingling.   Patient has history of bipolar disorder, alcohol induced dementia, tremors, Warnicke's encephalopathy, epilepsy, prior strokes  The history is provided by the EMS personnel and the patient. The history is limited by the condition of the patient.  Fall       Home Medications Prior to Admission medications   Medication Sig Start Date End Date Taking? Authorizing Provider  acetaminophen (TYLENOL) 325 MG tablet Take 2 tablets (650 mg total) by mouth every 6 (six) hours as needed for mild pain (or Fever >/= 101). 07/04/21   Nita Sells, MD  alum & mag hydroxide-simeth (MI-ACID) 200-200-20 MG/5ML suspension Take 30 mLs by mouth every 6 (six) hours as needed for indigestion or heartburn.    [provider]  bisacodyl (DULCOLAX) 10 MG suppository Place 1 suppository (10 mg total) rectally daily as needed for moderate constipation. 05/07/22   Donne Hazel, MD  carbidopa-levodopa (SINEMET IR) 25-100 MG tablet Take 0.5 tablets by mouth 3 (three) times daily. 07/10/21   Nita Sells, MD  divalproex (DEPAKOTE SPRINKLE) 125 MG capsule Take 3 capsules (375 mg total) by mouth 2 (two) times daily. Taken at 4pm and 10pm 05/07/22  06/06/22  Donne Hazel, MD  divalproex (DEPAKOTE SPRINKLE) 125 MG capsule Take 4 capsules (500 mg total) by mouth daily. Taken at 10am 05/08/22 06/07/22  Donne Hazel, MD  guaifenesin (ROBITUSSIN) 100 MG/5ML syrup Take 200 mg by mouth 4 (four) times daily as needed for cough.    [provider]  lactobacillus acidophilus (BACID) TABS tablet Take 2 tablets by mouth 3 (three) times daily.    [provider]  levothyroxine (SYNTHROID) 75 MCG tablet Take 75 mcg by mouth daily before breakfast. 06/15/21   [provider]  loperamide (IMODIUM) 2 MG capsule Take 2 mg by mouth daily as needed for diarrhea or loose stools.    [provider]  magnesium hydroxide (MILK OF MAGNESIA) 400 MG/5ML suspension Take 30 mLs by mouth at bedtime as needed for mild constipation.    [provider]  Multiple Vitamin (MULTIVITAMIN WITH MINERALS) TABS tablet Take 1 tablet by mouth daily.    [provider]  neomycin-bacitracin-polymyxin (NEOSPORIN) [phone] ointment Apply 1 Application topically daily as needed (wound care).    [provider]  OLANZapine (ZYPREXA) 15 MG tablet Take 7.5 mg by mouth 2 (two) times daily. 02/14/22   [provider]  polyethylene glycol (MIRALAX / GLYCOLAX) 17 g packet Take 17 g by mouth daily. 05/08/22   Donne Hazel, MD  thiamine 100 MG tablet Take 1 tablet (100 mg total) by mouth daily. 05/17/20   Eulogio Bear  U, DO      Allergies    Patient has no known allergies.    Review of Systems   Review of Systems  Unable to perform ROS: Dementia    Physical Exam Updated Vital Signs BP 117/69 (BP Location: Left Arm)   Pulse (!) 57   Resp 18   Ht '6\' 2"'$  (1.88 m)   Wt 62.1 kg   SpO2 100%   BMI 17.59 kg/m  Physical Exam Vitals and nursing note reviewed.  Constitutional:      General: He is not in acute distress.    Appearance: He is well-developed.  HENT:     Head:     Comments: Right eyebrow laceration  hemostatic approximately 4 cm.  Patient not able to follow commands for wrinkling forehead or closing eyes.    Mouth/Throat:     Pharynx: No oropharyngeal exudate.  Eyes:     Conjunctiva/sclera: Conjunctivae normal.     Pupils: Pupils are equal, round, and reactive to light.  Neck:     Comments: No meningismus. Cardiovascular:     Rate and Rhythm: Normal rate and regular rhythm.     Heart sounds: Normal heart sounds. No murmur heard. Pulmonary:     Effort: Pulmonary effort is normal. No respiratory distress.     Breath sounds: Normal breath sounds.  Abdominal:     Palpations: Abdomen is soft.     Tenderness: There is no abdominal tenderness. There is no guarding or rebound.  Musculoskeletal:        General: No tenderness. Normal range of motion.     Cervical back: Normal range of motion and neck supple.  Skin:    General: Skin is warm.  Neurological:     Mental Status: He is alert.     Cranial Nerves: No cranial nerve deficit.     Motor: No abnormal muscle tone.     Coordination: Coordination normal.     Comments: Nonverbal, does not follow commands, moves all extremities equally. No obvious facial droop.  Unable to wrinkle forehead to command.  Psychiatric:        Behavior: Behavior normal.     ED Results / Procedures / Treatments   Labs (all labs ordered are listed, but only abnormal results are displayed) Labs Reviewed  CBC WITH DIFFERENTIAL/PLATELET - Abnormal; Notable for the following components:      Result Value   RBC 3.73 (*)    Hemoglobin 12.3 (*)    HCT 37.8 (*)    MCV 101.3 (*)    All other components within normal limits  COMPREHENSIVE METABOLIC PANEL - Abnormal; Notable for the following components:   BUN 28 (*)    Total Protein 6.0 (*)    All other components within normal limits  VALPROIC ACID LEVEL - Abnormal; Notable for the following components:   Valproic Acid Lvl 43 (*)    All other components within normal limits  URINALYSIS, ROUTINE W REFLEX  MICROSCOPIC    EKG EKG Interpretation  Date/Time:  Sunday June 30 2022 10:43:20 EST Ventricular Rate:  59 PR Interval:  149 QRS Duration: 91 QT Interval:  488 QTC Calculation: 484 R Axis:   79 Text Interpretation: Sinus rhythm Inferior infarct, acute (LCx) Interpretation limited secondary to artifact Confirmed by Ezequiel Essex [phone]) on 06/30/2022 10:45:19 AM  Radiology CT Head Wo Contrast  Result Date: 06/30/2022 CLINICAL DATA:  Head trauma, moderate-severe; Neck trauma (Age >= 65y); Facial trauma, blunt EXAM: CT HEAD WITHOUT CONTRAST CT MAXILLOFACIAL  WITHOUT CONTRAST CT CERVICAL SPINE WITHOUT CONTRAST TECHNIQUE: Multidetector CT imaging of the head, cervical spine, and maxillofacial structures were performed using the standard protocol without intravenous contrast. Multiplanar CT image reconstructions of the cervical spine and maxillofacial structures were also generated. RADIATION DOSE REDUCTION: This exam was performed according to the departmental dose-optimization program which includes automated exposure control, adjustment of the mA and/or kV according to patient size and/or use of iterative reconstruction technique. COMPARISON:  04/18/2022 FINDINGS: CT HEAD FINDINGS Brain: No evidence of acute infarction, hemorrhage, hydrocephalus, extra-axial collection or mass lesion/mass effect. Moderate low-density changes within the periventricular and subcortical white matter most compatible with chronic microvascular ischemic change. Mild-moderate diffuse cerebral volume loss. Vascular: Atherosclerotic calcifications involving the large vessels of the skull base. No unexpected hyperdense vessel. Skull: Normal. Negative for fracture or focal lesion. Other: Negative for scalp hematoma. CT MAXILLOFACIAL FINDINGS Osseous: No acute maxillofacial bone fracture. Bony orbital walls are intact. Mandible intact. Temporomandibular joints are aligned without dislocation. Orbits: Negative. No traumatic or  inflammatory finding. Sinuses: Minimal mucosal thickening in the maxillary sinuses with small retention cysts in the left maxillary sinus. No air-fluid levels. Soft tissues: Negative. CT CERVICAL SPINE FINDINGS Alignment: Facet joints are aligned without dislocation or traumatic listhesis. Dens and lateral masses are aligned. Slight reversal of the cervical lordosis. Skull base and vertebrae: No acute fracture. No primary bone lesion or focal pathologic process. Soft tissues and spinal canal: No prevertebral fluid or swelling. No visible canal hematoma. Disc levels: Moderate degenerative disc disease and facet arthropathy throughout the cervical spine, not appreciably changed from prior. Upper chest: Included lung apices are clear. Other: Bilateral carotid atherosclerosis. IMPRESSION: 1. No acute intracranial abnormality. 2. No acute maxillofacial bone fracture. 3. No acute cervical spine fracture or subluxation. Electronically Signed   By: Davina Poke D.O.   On: 06/30/2022 11:46   CT Cervical Spine Wo Contrast  Result Date: 06/30/2022 CLINICAL DATA:  Head trauma, moderate-severe; Neck trauma (Age >= 65y); Facial trauma, blunt EXAM: CT HEAD WITHOUT CONTRAST CT MAXILLOFACIAL WITHOUT CONTRAST CT CERVICAL SPINE WITHOUT CONTRAST TECHNIQUE: Multidetector CT imaging of the head, cervical spine, and maxillofacial structures were performed using the standard protocol without intravenous contrast. Multiplanar CT image reconstructions of the cervical spine and maxillofacial structures were also generated. RADIATION DOSE REDUCTION: This exam was performed according to the departmental dose-optimization program which includes automated exposure control, adjustment of the mA and/or kV according to patient size and/or use of iterative reconstruction technique. COMPARISON:  04/18/2022 FINDINGS: CT HEAD FINDINGS Brain: No evidence of acute infarction, hemorrhage, hydrocephalus, extra-axial collection or mass lesion/mass  effect. Moderate low-density changes within the periventricular and subcortical white matter most compatible with chronic microvascular ischemic change. Mild-moderate diffuse cerebral volume loss. Vascular: Atherosclerotic calcifications involving the large vessels of the skull base. No unexpected hyperdense vessel. Skull: Normal. Negative for fracture or focal lesion. Other: Negative for scalp hematoma. CT MAXILLOFACIAL FINDINGS Osseous: No acute maxillofacial bone fracture. Bony orbital walls are intact. Mandible intact. Temporomandibular joints are aligned without dislocation. Orbits: Negative. No traumatic or inflammatory finding. Sinuses: Minimal mucosal thickening in the maxillary sinuses with small retention cysts in the left maxillary sinus. No air-fluid levels. Soft tissues: Negative. CT CERVICAL SPINE FINDINGS Alignment: Facet joints are aligned without dislocation or traumatic listhesis. Dens and lateral masses are aligned. Slight reversal of the cervical lordosis. Skull base and vertebrae: No acute fracture. No primary bone lesion or focal pathologic process. Soft tissues and spinal canal: No prevertebral fluid or  swelling. No visible canal hematoma. Disc levels: Moderate degenerative disc disease and facet arthropathy throughout the cervical spine, not appreciably changed from prior. Upper chest: Included lung apices are clear. Other: Bilateral carotid atherosclerosis. IMPRESSION: 1. No acute intracranial abnormality. 2. No acute maxillofacial bone fracture. 3. No acute cervical spine fracture or subluxation. Electronically Signed   By: Davina Poke D.O.   On: 06/30/2022 11:46   CT Maxillofacial Wo Contrast  Result Date: 06/30/2022 CLINICAL DATA:  Head trauma, moderate-severe; Neck trauma (Age >= 65y); Facial trauma, blunt EXAM: CT HEAD WITHOUT CONTRAST CT MAXILLOFACIAL WITHOUT CONTRAST CT CERVICAL SPINE WITHOUT CONTRAST TECHNIQUE: Multidetector CT imaging of the head, cervical spine, and  maxillofacial structures were performed using the standard protocol without intravenous contrast. Multiplanar CT image reconstructions of the cervical spine and maxillofacial structures were also generated. RADIATION DOSE REDUCTION: This exam was performed according to the departmental dose-optimization program which includes automated exposure control, adjustment of the mA and/or kV according to patient size and/or use of iterative reconstruction technique. COMPARISON:  04/18/2022 FINDINGS: CT HEAD FINDINGS Brain: No evidence of acute infarction, hemorrhage, hydrocephalus, extra-axial collection or mass lesion/mass effect. Moderate low-density changes within the periventricular and subcortical white matter most compatible with chronic microvascular ischemic change. Mild-moderate diffuse cerebral volume loss. Vascular: Atherosclerotic calcifications involving the large vessels of the skull base. No unexpected hyperdense vessel. Skull: Normal. Negative for fracture or focal lesion. Other: Negative for scalp hematoma. CT MAXILLOFACIAL FINDINGS Osseous: No acute maxillofacial bone fracture. Bony orbital walls are intact. Mandible intact. Temporomandibular joints are aligned without dislocation. Orbits: Negative. No traumatic or inflammatory finding. Sinuses: Minimal mucosal thickening in the maxillary sinuses with small retention cysts in the left maxillary sinus. No air-fluid levels. Soft tissues: Negative. CT CERVICAL SPINE FINDINGS Alignment: Facet joints are aligned without dislocation or traumatic listhesis. Dens and lateral masses are aligned. Slight reversal of the cervical lordosis. Skull base and vertebrae: No acute fracture. No primary bone lesion or focal pathologic process. Soft tissues and spinal canal: No prevertebral fluid or swelling. No visible canal hematoma. Disc levels: Moderate degenerative disc disease and facet arthropathy throughout the cervical spine, not appreciably changed from prior. Upper  chest: Included lung apices are clear. Other: Bilateral carotid atherosclerosis. IMPRESSION: 1. No acute intracranial abnormality. 2. No acute maxillofacial bone fracture. 3. No acute cervical spine fracture or subluxation. Electronically Signed   By: Davina Poke D.O.   On: 06/30/2022 11:46   DG Chest 2 View  Result Date: 06/30/2022 CLINICAL DATA:  77 year old male with history of trauma from a fall. EXAM: CHEST - 2 VIEW COMPARISON:  Chest x-ray 04/21/2022. FINDINGS: Lung volumes are normal. No consolidative airspace disease. No pleural effusions. No pneumothorax. No pulmonary nodule or mass noted. Pulmonary vasculature and the cardiomediastinal silhouette are within normal limits. Atherosclerosis in the thoracic aorta. IMPRESSION: 1.  No radiographic evidence of acute cardiopulmonary disease. 2. Aortic atherosclerosis. Electronically Signed   By: Vinnie Langton M.D.   On: 06/30/2022 11:13   DG Pelvis 1-2 Views  Result Date: 06/30/2022 CLINICAL DATA:  Fall in bathroom.  Pelvic pain. EXAM: PELVIS - 1-2 VIEW COMPARISON:  None Available. FINDINGS: There is no evidence of pelvic fracture or diastasis. No pelvic bone lesions are seen. IMPRESSION: Negative. Electronically Signed   By: Marlaine Hind M.D.   On: 06/30/2022 11:13    Procedures Procedures    Medications Ordered in ED Medications  Tdap (BOOSTRIX) injection 0.5 mL (0.5 mLs Intramuscular Given 06/30/22 1220)  lidocaine-EPINEPHrine (XYLOCAINE  W/EPI) 2 %-1:200000 (PF) injection 20 mL (20 mLs Infiltration Given 06/30/22 1428)    ED Course/ Medical Decision Making/ A&P                             Medical Decision Making Amount and/or Complexity of Data Reviewed Independent Historian: EMS Labs: ordered. Decision-making details documented in ED Course. Radiology: ordered and independent interpretation performed. Decision-making details documented in ED Course. ECG/medicine tests: ordered and independent interpretation performed.  Decision-making details documented in ED Course.  Risk Prescription drug management.   Fall with eyebrow laceration.  Acting at baseline per facility.  Patient unable to give any history.  Vital stable no distress.  Update tetanus.  CT head, C-spine and face are negative for acute traumatic pathology.  Results reviewed and interpreted by me.  Chest x-ray negative, pelvis x-ray negative.  Basic labs are obtained that shows stable chemistry and CBC as well as subtherapeutic Depakote level.  Patient's son at bedside confirms he is at his baseline. He is chronically nonverbal.  Laceration procedure by Schutt PA-C.  Wound care instructions given. Suture removal 5-7 days.  Appears stable to return to facility.        Final Clinical Impression(s) / ED Diagnoses Final diagnoses:  Fall, initial encounter  Laceration of right eyebrow, initial encounter    Rx / DC Orders ED Discharge Orders     None         Krystie Leiter, Annie Main, MD 06/30/22 1706

## 2022-07-01 NOTE — ED Notes (Signed)
Nurse Fatima Blank. from [place] called requesting a copy of pts AVS. To be faxed to [phone]

## 2022-11-01 ENCOUNTER — Emergency Department (HOSPITAL_COMMUNITY): Payer: Medicare Other

## 2022-11-01 ENCOUNTER — Emergency Department (HOSPITAL_COMMUNITY)
Admission: EM | Admit: 2022-11-01 | Discharge: 2022-11-01 | Disposition: A | Discharge status: Other Acute Inpatient Hospital | Payer: Medicare Other | Attending: Emergency Medicine | Admitting: Emergency Medicine

## 2022-11-01 ENCOUNTER — Other Ambulatory Visit: Payer: Self-pay

## 2022-11-01 DIAGNOSIS — W1830XA Fall on same level, unspecified, initial encounter: Secondary | ICD-10-CM | POA: Diagnosis not present

## 2022-11-01 DIAGNOSIS — S0993XA Unspecified injury of face, initial encounter: Secondary | ICD-10-CM | POA: Diagnosis present

## 2022-11-01 DIAGNOSIS — F0390 Unspecified dementia without behavioral disturbance: Secondary | ICD-10-CM | POA: Insufficient documentation

## 2022-11-01 DIAGNOSIS — S61011A Laceration without foreign body of right thumb without damage to nail, initial encounter: Secondary | ICD-10-CM | POA: Diagnosis not present

## 2022-11-01 DIAGNOSIS — S01511A Laceration without foreign body of lip, initial encounter: Secondary | ICD-10-CM | POA: Insufficient documentation

## 2022-11-01 DIAGNOSIS — S0181XA Laceration without foreign body of other part of head, initial encounter: Secondary | ICD-10-CM

## 2022-11-01 DIAGNOSIS — W19XXXA Unspecified fall, initial encounter: Secondary | ICD-10-CM

## 2022-11-01 DIAGNOSIS — Y9302 Activity, running: Secondary | ICD-10-CM | POA: Insufficient documentation

## 2022-11-01 DIAGNOSIS — S61212A Laceration without foreign body of right middle finger without damage to nail, initial encounter: Secondary | ICD-10-CM | POA: Insufficient documentation

## 2022-11-01 MED ORDER — AMOXICILLIN-POT CLAVULANATE 875-125 MG PO TABS: 1.0000 | ORAL_TABLET | Freq: Two times a day (BID) | ORAL | 0 refills | Status: AC

## 2022-11-01 NOTE — ED Notes (Signed)
Spoke with son Rooney Chernesky) about his father being here. He informed me that pt needs a thickener for all his liquids. He would like to be updated on whether his father gets discharged or admitted.

## 2022-11-01 NOTE — ED Triage Notes (Signed)
Pt arrived via EMS from [facility] home. According to home he was running down the hall when he tripped and fell. Lac on right eye, right cheek, and skin tear on right hand. C collar on per protocol because he hit his head.  BP 128/78  HR 62 O2 99% CBG 113

## 2022-11-01 NOTE — ED Notes (Signed)
Ptar contacted. 

## 2022-11-01 NOTE — ED Provider Notes (Addendum)
[place] EMERGENCY DEPARTMENT AT [facility] Provider Note   CSN: [id number] Arrival date & time: 11/01/22  1644     History  Chief Complaint  Patient presents with   Marletta Lor    Trevor Cook is a 77 y.o. male.  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  Patient does not require a walker or any assistance in walking.  It was witnessed.  He had no loss of consciousness.  Patient takes no anticoagulation.  They did put a c-collar on and based on protocol but he did not appear to have any pain.  They noticed injury to the right side of his face and lip as well as his right hand.  Facility did report patient is at baseline.  Tetanus shot is up-to-date and was given less than 1 year ago.  The history is provided by the nursing home and the EMS personnel.  Fall       Home Medications Prior to Admission medications   Medication Sig Start Date End Date Taking? Authorizing Provider  amoxicillin-clavulanate (AUGMENTIN) 875-125 MG tablet Take 1 tablet by mouth every 12 (twelve) hours. 11/01/22  Yes Gwyneth Sprout, MD  acetaminophen (TYLENOL) 325 MG tablet Take 2 tablets (650 mg total) by mouth every 6 (six) hours as needed for mild pain (or Fever >/= 101). 07/04/21   Rhetta Mura, MD  alum & mag hydroxide-simeth (MI-ACID) 200-200-20 MG/5ML suspension Take 30 mLs by mouth every 6 (six) hours as needed for indigestion or heartburn.    [provider]  bisacodyl (DULCOLAX) 10 MG suppository Place 1 suppository (10 mg total) rectally daily as needed for moderate constipation. 05/07/22   Jerald Kief, MD  carbidopa-levodopa (SINEMET IR) 25-100 MG tablet Take 0.5 tablets by mouth 3 (three) times daily. 07/10/21   Rhetta Mura, MD  divalproex (DEPAKOTE SPRINKLE) 125 MG capsule Take 3 capsules (375 mg  total) by mouth 2 (two) times daily. Taken at 4pm and 10pm 05/07/22 06/06/22  Jerald Kief, MD  divalproex (DEPAKOTE SPRINKLE) 125 MG capsule Take 4 capsules (500 mg total) by mouth daily. Taken at 10am 05/08/22 06/07/22  Jerald Kief, MD  guaifenesin (ROBITUSSIN) 100 MG/5ML syrup Take 200 mg by mouth 4 (four) times daily as needed for cough.    [provider]  lactobacillus acidophilus (BACID) TABS tablet Take 2 tablets by mouth 3 (three) times daily.    [provider]  levothyroxine (SYNTHROID) 75 MCG tablet Take 75 mcg by mouth daily before breakfast. 06/15/21   [provider]  loperamide (IMODIUM) 2 MG capsule Take 2 mg by mouth daily as needed for diarrhea or loose stools.    [provider]  magnesium hydroxide (MILK OF MAGNESIA) 400 MG/5ML suspension Take 30 mLs by mouth at bedtime as needed for mild constipation.    [provider]  Multiple Vitamin (MULTIVITAMIN WITH MINERALS) TABS tablet Take 1 tablet by mouth daily.    [provider]  neomycin-bacitracin-polymyxin (NEOSPORIN) [phone] ointment Apply 1 Application topically daily as needed (wound care).    [provider]  OLANZapine (ZYPREXA) 15 MG tablet Take 7.5 mg by mouth 2 (two) times daily. 02/14/22   [provider]  polyethylene glycol (MIRALAX / GLYCOLAX) 17 g packet Take 17 g by mouth daily. 05/08/22   Jerald Kief, MD  thiamine 100 MG tablet Take 1 tablet (100 mg total) by mouth daily. 05/17/20   Joseph Art, DO      Allergies    Patient has no known allergies.    Review of Systems   Review of Systems  Physical Exam Updated Vital Signs BP (!) 153/68   Temp 98.1 F (36.7 C) (Axillary)   Resp (!) 22   SpO2 99%  Physical Exam Vitals and nursing note reviewed.  Constitutional:      General: He is not in acute distress.    Appearance: He is well-developed.  HENT:     Head: Normocephalic. Contusion and laceration present.       Mouth/Throat:     Comments: Poor dentition but all teeth appear intact.  Deep laceration noted to the inner right upper lip that is currently bleeding.  Does not violate the vermilion border. Eyes:     Conjunctiva/sclera: Conjunctivae normal.     Pupils: Pupils are equal, round, and reactive to light.  Cardiovascular:     Rate and Rhythm: Normal rate and regular rhythm.     Heart sounds: No murmur heard. Pulmonary:     Effort: Pulmonary effort is normal. No respiratory distress.     Breath sounds: Normal breath sounds. No wheezing or rales.  Abdominal:     General: There is no distension.     Palpations: Abdomen is soft.     Tenderness: There is no abdominal tenderness. There is no guarding or rebound.  Musculoskeletal:        General: Signs of injury present. No tenderness. Normal range of motion.       Hands:     Cervical back: Normal range of motion and neck supple.     Comments: Patient's bilateral arms and legs were removed he does not appear to have any pain at this time.  Skin:    General: Skin is warm and dry.     Findings: No erythema or rash.  Neurological:     Mental Status: He is alert and oriented to person, place, and time.  Psychiatric:        Behavior: Behavior normal.     ED Results / Procedures / Treatments   Labs (all labs ordered are listed, but only abnormal results are displayed) Labs Reviewed - No data to display  EKG None  Radiology CT Head Wo Contrast  Result Date: 11/01/2022 CLINICAL DATA:  Head trauma as patient tripped and fell with laceration to right eye and face. EXAM: CT HEAD WITHOUT CONTRAST CT MAXILLOFACIAL WITHOUT CONTRAST TECHNIQUE: Multidetector CT imaging of the head and maxillofacial structures were performed using the standard protocol without intravenous contrast. Multiplanar CT image reconstructions of the maxillofacial structures were also generated. RADIATION DOSE REDUCTION: This exam was performed according to the departmental  dose-optimization program which includes automated exposure control, adjustment of the mA and/or kV according to patient size and/or use of iterative reconstruction technique. COMPARISON:  06/30/2022 FINDINGS: CT HEAD FINDINGS Brain: Ventricles, cisterns and other CSF spaces are normal. There is mild chronic ischemic microvascular disease present. There is no mass, mass effect, shift of midline structures or acute hemorrhage. No evidence of acute infarction. Vascular: No hyperdense vessel or unexpected calcification. Skull: Normal. Negative for fracture or focal lesion. Other: None. CT MAXILLOFACIAL FINDINGS Osseous: No acute fracture. Evidence of dental caries and periodontal disease unchanged. Orbits: Negative. No traumatic or inflammatory finding. Sinuses: Paranasal sinuses are well aerated with minimal mucosal membrane thickening over the maxillary  sinuses compatible chronic inflammatory change. Deviation of the nasal septum to the left. No air-fluid levels. Mastoid air cells are clear. Soft tissues: Soft tissue edema over the lateral right mid face and infraorbital region. Subcutaneous hematoma measuring 1 x 2.2 cm superficial to the right anterior body of the mandible. IMPRESSION: 1. No acute brain injury. 2. Mild chronic ischemic microvascular disease. 3. No acute facial bone fracture. 4. Soft tissue edema over the lateral right mid face/infraorbital region with subcutaneous hematoma measuring 1 x 2.2 cm superficial to the right anterior body of the mandible. 5. Evidence of dental caries and periodontal disease unchanged. Electronically Signed   By: Elberta Fortis M.D.   On: 11/01/2022 18:52   CT Maxillofacial Wo Contrast  Result Date: 11/01/2022 CLINICAL DATA:  Head trauma as patient tripped and fell with laceration to right eye and face. EXAM: CT HEAD WITHOUT CONTRAST CT MAXILLOFACIAL WITHOUT CONTRAST TECHNIQUE: Multidetector CT imaging of the head and maxillofacial structures were performed using the  standard protocol without intravenous contrast. Multiplanar CT image reconstructions of the maxillofacial structures were also generated. RADIATION DOSE REDUCTION: This exam was performed according to the departmental dose-optimization program which includes automated exposure control, adjustment of the mA and/or kV according to patient size and/or use of iterative reconstruction technique. COMPARISON:  06/30/2022 FINDINGS: CT HEAD FINDINGS Brain: Ventricles, cisterns and other CSF spaces are normal. There is mild chronic ischemic microvascular disease present. There is no mass, mass effect, shift of midline structures or acute hemorrhage. No evidence of acute infarction. Vascular: No hyperdense vessel or unexpected calcification. Skull: Normal. Negative for fracture or focal lesion. Other: None. CT MAXILLOFACIAL FINDINGS Osseous: No acute fracture. Evidence of dental caries and periodontal disease unchanged. Orbits: Negative. No traumatic or inflammatory finding. Sinuses: Paranasal sinuses are well aerated with minimal mucosal membrane thickening over the maxillary sinuses compatible chronic inflammatory change. Deviation of the nasal septum to the left. No air-fluid levels. Mastoid air cells are clear. Soft tissues: Soft tissue edema over the lateral right mid face and infraorbital region. Subcutaneous hematoma measuring 1 x 2.2 cm superficial to the right anterior body of the mandible. IMPRESSION: 1. No acute brain injury. 2. Mild chronic ischemic microvascular disease. 3. No acute facial bone fracture. 4. Soft tissue edema over the lateral right mid face/infraorbital region with subcutaneous hematoma measuring 1 x 2.2 cm superficial to the right anterior body of the mandible. 5. Evidence of dental caries and periodontal disease unchanged. Electronically Signed   By: Elberta Fortis M.D.   On: 11/01/2022 18:52    Procedures Procedures   LACERATION REPAIR Performed by: [facility] Authorized by: Gwyneth Sprout Consent: Verbal consent obtained. Risks and benefits: risks, benefits and alternatives were discussed Consent given by: patient Patient identity confirmed: provided demographic data Prepped and Draped in normal sterile fashion Wound explored  Laceration Location: right cheek, right upper lip and right 3rd finger  Laceration Length: 0.5, 0.5, 2cm  No Foreign Bodies seen or palpated  Anesthesia: none Local anesthetic: none  Irrigation method: syringe Amount of cleaning: standard  Skin closure: dermabond   Patient tolerance: Patient tolerated the procedure well with no immediate complications.  Medications Ordered in ED Medications - No data to display  ED Course/ Medical Decision Making/ A&P                             Medical Decision Making Amount and/or Complexity of Data Reviewed Radiology: ordered and  independent interpretation performed. Decision-making details documented in ED Course.  Risk Prescription drug management.   Pt with multiple medical problems and comorbidities and presenting today with a complaint that caries a high risk for morbidity and mortality.  Patient presenting after a fall today that was witnessed at his facility.  Ground-level fall with trauma to the right side of the face.  Superficial lacerations were repaired with Dermabond.  Patient does have a deeper laceration to the inner portion of the right upper lip however does not violate the vermilion border and feel that it will heal secondarily.  Imaging of the head and face are pending to ensure no fractures or intracranial injury.  Patient is moving upper and lower extremities low suspicion for cervical injury.  C-spine was cleared.  The shot is up-to-date.  7:11 PM I have independently visualized and interpreted pt's images today.  CT of the head without evidence of acute bleed or fractures to the face.  At this time patient appears clear for discharge home.  He was covered with an  antibiotic as the lip laceration may be through and through.         Final Clinical Impression(s) / ED Diagnoses Final diagnoses:  Fall, initial encounter  Facial laceration, initial encounter  Lip laceration, initial encounter    Rx / DC Orders ED Discharge Orders          Ordered    amoxicillin-clavulanate (AUGMENTIN) 875-125 MG tablet  Every 12 hours        11/01/22 1911              Gwyneth Sprout, MD 11/01/22 [place]    Gwyneth Sprout, MD 11/01/22 1912

## 2022-11-01 NOTE — Discharge Instructions (Signed)
The laceration to the inner lip will heal on its own.  He just needs to be given soft foods nothing that is hot, spicy, salty or has sharp corners.  The lip should be healed within 2 to 3 days.  The glue on the face will peel off when it is ready.  He can still shower.  All CAT scan and x-rays are normal.  He was given an antibiotic because of the injury to the mouth.

## 2022-11-01 NOTE — ED Notes (Addendum)
Called son back and informed him that his father is being discharged and we will arrange PTAr for his return to the facility

## 2022-12-04 ENCOUNTER — Other Ambulatory Visit: Payer: Self-pay

## 2022-12-04 ENCOUNTER — Emergency Department (HOSPITAL_COMMUNITY)
Admission: EM | Admit: 2022-12-04 | Discharge: 2022-12-05 | Disposition: A | Discharge status: Home / Self Care | Attending: Emergency Medicine | Admitting: Emergency Medicine

## 2022-12-04 ENCOUNTER — Encounter (HOSPITAL_COMMUNITY): Payer: Self-pay

## 2022-12-04 ENCOUNTER — Emergency Department (HOSPITAL_COMMUNITY)

## 2022-12-04 DIAGNOSIS — W1830XA Fall on same level, unspecified, initial encounter: Secondary | ICD-10-CM | POA: Insufficient documentation

## 2022-12-04 DIAGNOSIS — F0390 Unspecified dementia without behavioral disturbance: Secondary | ICD-10-CM | POA: Diagnosis not present

## 2022-12-04 DIAGNOSIS — S01112A Laceration without foreign body of left eyelid and periocular area, initial encounter: Secondary | ICD-10-CM

## 2022-12-04 DIAGNOSIS — W19XXXA Unspecified fall, initial encounter: Secondary | ICD-10-CM

## 2022-12-04 MED ORDER — LIDOCAINE-EPINEPHRINE (PF) 2 %-1:200000 IJ SOLN
20.0000 mL | Freq: Once | INTRAMUSCULAR | Status: AC
  Administered 2022-12-04: 20 mL
  Filled 2022-12-04: qty 20

## 2022-12-04 NOTE — Progress Notes (Signed)
[facility] Liaison Note  This is a current hospice patient of [employer].  We will follow for discharge disposition.  Please call with any hospice related questions.  Thank you, Haynes Bast, BSN, [facility] [phone]

## 2022-12-04 NOTE — ED Provider Notes (Signed)
[place] EMERGENCY DEPARTMENT AT [facility] Provider Note   CSN: [id number] Arrival date & time: 12/04/22  1346     History  Chief Complaint  Patient presents with   Marletta Lor    Trevor Cook is a 77 y.o. male.  HPI 77 year old male presents after a fall at his facility.  Patient falls frequently.  I discussed with the son and he has been involved with hospice nurses as well.  No new complaints.  Patient has a laceration above his left eyebrow.  Home Medications Prior to Admission medications   Medication Sig Start Date End Date Taking? Authorizing Provider  acetaminophen (TYLENOL) 325 MG tablet Take 2 tablets (650 mg total) by mouth every 6 (six) hours as needed for mild pain (or Fever >/= 101). 07/04/21   Rhetta Mura, MD  alum & mag hydroxide-simeth (MI-ACID) 200-200-20 MG/5ML suspension Take 30 mLs by mouth every 6 (six) hours as needed for indigestion or heartburn.    [provider]  amoxicillin-clavulanate (AUGMENTIN) 875-125 MG tablet Take 1 tablet by mouth every 12 (twelve) hours. 11/01/22   Gwyneth Sprout, MD  bisacodyl (DULCOLAX) 10 MG suppository Place 1 suppository (10 mg total) rectally daily as needed for moderate constipation. 05/07/22   Jerald Kief, MD  carbidopa-levodopa (SINEMET IR) 25-100 MG tablet Take 0.5 tablets by mouth 3 (three) times daily. 07/10/21   Rhetta Mura, MD  divalproex (DEPAKOTE SPRINKLE) 125 MG capsule Take 3 capsules (375 mg total) by mouth 2 (two) times daily. Taken at 4pm and 10pm 05/07/22 06/06/22  Jerald Kief, MD  divalproex (DEPAKOTE SPRINKLE) 125 MG capsule Take 4 capsules (500 mg total) by mouth daily. Taken at 10am 05/08/22 06/07/22  Jerald Kief, MD  guaifenesin (ROBITUSSIN) 100 MG/5ML syrup Take 200 mg by mouth 4 (four) times daily as needed for cough.    [provider]  lactobacillus acidophilus (BACID) TABS tablet Take 2 tablets by mouth 3 (three) times daily.    [provider]   levothyroxine (SYNTHROID) 75 MCG tablet Take 75 mcg by mouth daily before breakfast. 06/15/21   [provider]  loperamide (IMODIUM) 2 MG capsule Take 2 mg by mouth daily as needed for diarrhea or loose stools.    [provider]  magnesium hydroxide (MILK OF MAGNESIA) 400 MG/5ML suspension Take 30 mLs by mouth at bedtime as needed for mild constipation.    [provider]  Multiple Vitamin (MULTIVITAMIN WITH MINERALS) TABS tablet Take 1 tablet by mouth daily.    [provider]  neomycin-bacitracin-polymyxin (NEOSPORIN) [phone] ointment Apply 1 Application topically daily as needed (wound care).    [provider]  OLANZapine (ZYPREXA) 15 MG tablet Take 7.5 mg by mouth 2 (two) times daily. 02/14/22   [provider]  polyethylene glycol (MIRALAX / GLYCOLAX) 17 g packet Take 17 g by mouth daily. 05/08/22   Jerald Kief, MD  thiamine 100 MG tablet Take 1 tablet (100 mg total) by mouth daily. 05/17/20   Joseph Art, DO      Allergies    Patient has no known allergies.    Review of Systems   Review of Systems  Unable to perform ROS: Dementia    Physical Exam Updated Vital Signs BP (!) 150/77 (BP Location: Right Arm)   Pulse (!) 52   Temp 98.1 F (36.7 C) (Oral)   Resp 18   Ht 6\' 2"  (1.88 m)   Wt 62.1 kg   SpO2 97%  BMI 17.59 kg/m  Physical Exam Vitals and nursing note reviewed.  Constitutional:      Appearance: He is well-developed.  HENT:     Head: Normocephalic. Laceration present.   Cardiovascular:     Rate and Rhythm: Normal rate and regular rhythm.     Heart sounds: Normal heart sounds.  Pulmonary:     Effort: Pulmonary effort is normal.  Abdominal:     General: There is no distension.     Palpations: Abdomen is soft.     Tenderness: There is no abdominal tenderness.  Musculoskeletal:     Comments: No pain with range of motion of his shoulders, elbows, hips, knees.  No deformities.  Skin:    General:  Skin is warm and dry.  Neurological:     Mental Status: He is alert.     Comments: Dementia, does not follow many commands.  Seems to move all 4 extremities on his own without difficulty     ED Results / Procedures / Treatments   Labs (all labs ordered are listed, but only abnormal results are displayed) Labs Reviewed - No data to display  EKG None  Radiology CT Head Wo Contrast  Result Date: 12/04/2022 CLINICAL DATA:  Fall, found on floor, laceration above left eyebrow EXAM: CT HEAD WITHOUT CONTRAST CT CERVICAL SPINE WITHOUT CONTRAST TECHNIQUE: Multidetector CT imaging of the head and cervical spine was performed following the standard protocol without intravenous contrast. Multiplanar CT image reconstructions of the cervical spine were also generated. RADIATION DOSE REDUCTION: This exam was performed according to the departmental dose-optimization program which includes automated exposure control, adjustment of the mA and/or kV according to patient size and/or use of iterative reconstruction technique. COMPARISON:  11/01/2022 FINDINGS: CT HEAD FINDINGS Brain: No evidence of acute infarction, hemorrhage, hydrocephalus, extra-axial collection or mass lesion/mass effect. Periventricular white matter hypodensity. Vascular: No hyperdense vessel or unexpected calcification. Skull: Normal. Negative for fracture or focal lesion. Sinuses/Orbits: No acute finding. Other: None. CT CERVICAL SPINE FINDINGS Alignment: Degenerative straightening of the normal cervical lordosis. Skull base and vertebrae: No acute fracture. No primary bone lesion or focal pathologic process. Soft tissues and spinal canal: No prevertebral fluid or swelling. No visible canal hematoma. 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 Upper chest: Negative. Other: None. IMPRESSION: 1. No acute intracranial pathology. Small-vessel white matter disease. 2. No fracture or static subluxation of the cervical  spine. 3. 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 Electronically Signed   By: Jearld Lesch M.D.   On: 12/04/2022 15:33   CT Cervical Spine Wo Contrast  Result Date: 12/04/2022 CLINICAL DATA:  Fall, found on floor, laceration above left eyebrow EXAM: CT HEAD WITHOUT CONTRAST CT CERVICAL SPINE WITHOUT CONTRAST TECHNIQUE: Multidetector CT imaging of the head and cervical spine was performed following the standard protocol without intravenous contrast. Multiplanar CT image reconstructions of the cervical spine were also generated. RADIATION DOSE REDUCTION: This exam was performed according to the departmental dose-optimization program which includes automated exposure control, adjustment of the mA and/or kV according to patient size and/or use of iterative reconstruction technique. COMPARISON:  11/01/2022 FINDINGS: CT HEAD FINDINGS Brain: No evidence of acute infarction, hemorrhage, hydrocephalus, extra-axial collection or mass lesion/mass effect. Periventricular white matter hypodensity. Vascular: No hyperdense vessel or unexpected calcification. Skull: Normal. Negative for fracture or focal lesion. Sinuses/Orbits: No acute finding. Other: None. CT CERVICAL SPINE FINDINGS Alignment: Degenerative straightening of the normal cervical lordosis. Skull base and vertebrae: No acute fracture. No primary  bone lesion or focal pathologic process. Soft tissues and spinal canal: No prevertebral fluid or swelling. No visible canal hematoma. 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 Upper chest: Negative. Other: None. IMPRESSION: 1. No acute intracranial pathology. Small-vessel white matter disease. 2. No fracture or static subluxation of the cervical spine. 3. 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 Electronically Signed   By: Jearld Lesch M.D.   On: 12/04/2022 15:33    Procedures .Marland KitchenLaceration Repair  Date/Time: 12/04/2022 6:01  PM  Performed by: Pricilla Loveless, MD Authorized by: Pricilla Loveless, MD   Universal protocol:    Patient identity confirmed:  Arm band Anesthesia:    Anesthesia method:  Local infiltration   Local anesthetic:  Lidocaine 2% WITH epi Laceration details:    Location:  Face   Face location:  L eyebrow   Length (cm):  6 Pre-procedure details:    Preparation:  Patient was prepped and draped in usual sterile fashion and imaging obtained to evaluate for foreign bodies Exploration:    Hemostasis achieved with:  Direct pressure   Wound exploration: entire depth of wound visualized   Treatment:    Area cleansed with:  Saline   Amount of cleaning:  Extensive   Irrigation solution:  Sterile saline   Irrigation method:  Syringe   Debridement:  None   Undermining:  None Skin repair:    Repair method:  Sutures   Suture size:  5-0   Suture material:  Fast-absorbing gut   Suture technique:  Simple interrupted   Number of sutures:  6 Approximation:    Approximation:  Close Repair type:    Repair type:  Simple Post-procedure details:    Dressing:  Non-adherent dressing   Procedure completion:  Tolerated well, no immediate complications     Medications Ordered in ED Medications  lidocaine-EPINEPHrine (XYLOCAINE W/EPI) 2 %-1:200000 (PF) injection 20 mL (has no administration in time range)    ED Course/ Medical Decision Making/ A&P                                 Medical Decision Making Amount and/or Complexity of Data Reviewed External Data Reviewed: notes. Radiology: independent interpretation performed.    Details: No head bleed on CT head  Risk Prescription drug management.   Patient presents with a fall at his facility and a eyebrow laceration.  CT head and C-spine unremarkable.  No obvious extremity injuries on my exam.  Vital signs are reassuring.  Discussed with his son, Shareef, who endorses this is a recurrent issue, he is in hospice, and no further workup is desired at  this time.  After laceration repair, he will be discharged back to his facility.        Final Clinical Impression(s) / ED Diagnoses Final diagnoses:  None    Rx / DC Orders ED Discharge Orders     None         Pricilla Loveless, MD 12/04/22 [phone]

## 2022-12-04 NOTE — ED Notes (Addendum)
Unable to reach facility for pt discharge report. This nurse has attempted 3 times to call report.

## 2022-12-04 NOTE — Discharge Instructions (Addendum)
6 sutures were placed above Trevor Cook's left thigh.  These should dissolve on their own.  If they have not fallen out by 10 days or so then his family doctor or a nurse can remove them.  CT scans did not show any trauma to his brain, skull, or spine.

## 2022-12-04 NOTE — ED Triage Notes (Signed)
Unwitnessed fall facility found him on the floor.  He does walk but unsteady gait.  2in lac to left side above eyebrow.  No blood thinner Unknown time on the ground.

## 2022-12-04 NOTE — ED Notes (Signed)
Patient transported to CT 

## 2022-12-09 ENCOUNTER — Other Ambulatory Visit: Payer: Self-pay

## 2022-12-09 ENCOUNTER — Emergency Department (HOSPITAL_COMMUNITY): Admission: EM | Admit: 2022-12-09 | Discharge: 2022-12-10 | Disposition: A | Discharge status: Home / Self Care

## 2022-12-09 ENCOUNTER — Encounter (HOSPITAL_COMMUNITY): Payer: Self-pay

## 2022-12-09 ENCOUNTER — Emergency Department (HOSPITAL_COMMUNITY)

## 2022-12-09 DIAGNOSIS — D649 Anemia, unspecified: Secondary | ICD-10-CM | POA: Insufficient documentation

## 2022-12-09 DIAGNOSIS — L03211 Cellulitis of face: Secondary | ICD-10-CM | POA: Insufficient documentation

## 2022-12-09 DIAGNOSIS — X58XXXA Exposure to other specified factors, initial encounter: Secondary | ICD-10-CM | POA: Diagnosis not present

## 2022-12-09 DIAGNOSIS — W1830XA Fall on same level, unspecified, initial encounter: Secondary | ICD-10-CM | POA: Diagnosis not present

## 2022-12-09 DIAGNOSIS — S62354A Nondisplaced fracture of shaft of fourth metacarpal bone, right hand, initial encounter for closed fracture: Secondary | ICD-10-CM | POA: Insufficient documentation

## 2022-12-09 DIAGNOSIS — F0390 Unspecified dementia without behavioral disturbance: Secondary | ICD-10-CM | POA: Diagnosis not present

## 2022-12-09 DIAGNOSIS — H00036 Abscess of eyelid left eye, unspecified eyelid: Secondary | ICD-10-CM | POA: Diagnosis not present

## 2022-12-09 DIAGNOSIS — I10 Essential (primary) hypertension: Secondary | ICD-10-CM | POA: Diagnosis not present

## 2022-12-09 DIAGNOSIS — R2231 Localized swelling, mass and lump, right upper limb: Secondary | ICD-10-CM | POA: Diagnosis present

## 2022-12-09 LAB — BASIC METABOLIC PANEL
Anion gap: 7 (ref 5–15)
BUN: 23 mg/dL (ref 8–23)
CO2: 27 mmol/L (ref 22–32)
Calcium: 8.6 mg/dL — ABNORMAL LOW (ref 8.9–10.3)
Chloride: 101 mmol/L (ref 98–111)
Creatinine, Ser: 0.67 mg/dL (ref 0.61–1.24)
GFR, Estimated: >60 mL/min (ref >60)
Glucose, Bld: 81 mg/dL (ref 70–99)
Potassium: 4.5 mmol/L (ref 3.5–5.1)
Sodium: 135 mmol/L (ref 135–145)

## 2022-12-09 LAB — CBC
HCT: 33.9 % — ABNORMAL LOW (ref 39.0–52.0)
Hemoglobin: 11 g/dL — ABNORMAL LOW (ref 13.0–17.0)
MCH: 33.1 pg (ref 26.0–34.0)
MCHC: 32.4 g/dL (ref 30.0–36.0)
MCV: 102.1 fL — ABNORMAL HIGH (ref 80.0–100.0)
Platelets: 195 10*3/uL (ref 150–400)
RBC: 3.32 MIL/uL — ABNORMAL LOW (ref 4.22–5.81)
RDW: 13.9 % (ref 11.5–15.5)
WBC: 10 10*3/uL (ref 4.0–10.5)
nRBC: 0 % (ref 0.0–0.2)

## 2022-12-09 LAB — I-STAT CG4 LACTIC ACID, ED: Lactic Acid, Venous: 0.4 mmol/L — ABNORMAL LOW (ref 0.5–1.9)

## 2022-12-09 MED ORDER — LORAZEPAM 1 MG PO TABS
1.0000 mg | ORAL_TABLET | Freq: Once | ORAL | Status: AC
  Administered 2022-12-10: 1 mg via ORAL
  Filled 2022-12-09 (×2): qty 1

## 2022-12-09 MED ORDER — CLINDAMYCIN PHOSPHATE 600 MG/50ML IV SOLN
600.0000 mg | Freq: Once | INTRAVENOUS | Status: AC
  Administered 2022-12-09: 600 mg via INTRAVENOUS
  Filled 2022-12-09: qty 50

## 2022-12-09 MED ORDER — IOHEXOL 300 MG/ML  SOLN
80.0000 mL | Freq: Once | INTRAMUSCULAR | Status: AC | PRN
  Administered 2022-12-09: 80 mL via INTRAVENOUS

## 2022-12-09 MED ORDER — CLINDAMYCIN HCL 300 MG PO CAPS: 300.0000 mg | ORAL_CAPSULE | Freq: Four times a day (QID) | ORAL | 0 refills | Status: AC

## 2022-12-09 NOTE — Progress Notes (Signed)
Orthopedic Tech Progress Note Patient Details:  Trevor Cook May 17, 1945 [id number]  Ortho Devices Type of Ortho Device: Volar splint Ortho Device/Splint Location: rue Ortho Device/Splint Interventions: Ordered, Application, Adjustment  I spoke the dr before seeing the patient as I had concern of forcing the patient into a ulna gutter splint as the patient has contracted fingers balled up into a fist. The dr said a volar splint would be ok. Post Interventions Patient Tolerated: Well Instructions Provided: Care of device, Adjustment of device  Trinna Post 12/09/2022, 11:53 PM

## 2022-12-09 NOTE — ED Triage Notes (Signed)
Per EMS  Fall last week Hit head on left forehead Staples placed Possible infected staples Left eye swollen  Pt cannot open it Discharge brown  *AMS-pt at baseline (more than normal lag in responsiveness)  EMS VS 60 HR 134/70 BP 18 RR CBG 162 98% RA Temp 97.9

## 2022-12-09 NOTE — ED Notes (Signed)
Called [facility] to place Ulnar Gutter splint for pt

## 2022-12-09 NOTE — Progress Notes (Signed)
[facility] Liaison Note  This is a current [facility] hosp[facility] patient. Please call with any questions or concerns. Thank you  Dionicio Stall, [facility] Liaison [phone]

## 2022-12-09 NOTE — ED Provider Notes (Signed)
[place] EMERGENCY DEPARTMENT AT [facility] Provider Note   CSN: [id number] Arrival date & time: 12/09/22  1501     History  Chief Complaint  Patient presents with   Facial Swelling    Left (staples)    Trevor Cook is a 77 y.o. male with past medical history significant for hypertension, Warnicke Korsakoff syndrome, history of alcohol abuse, acute metabolic encephalopathy, dementia at baseline who presents with concern for infection of the left forehead where staples were placed.  He had a fall just last week.  EMS endorsed brown discharge.  Patient without any other complaints but is nonverbal at baseline. Daughter in law also endorses some ongoing swelling of right hand for the last month, and patient will not use the affected hand.   HPI     Home Medications Prior to Admission medications   Medication Sig Start Date End Date Taking? Authorizing Provider  acetaminophen (TYLENOL) 325 MG tablet Take 2 tablets (650 mg total) by mouth every 6 (six) hours as needed for mild pain (or Fever >/= 101). 07/04/21  Yes Rhetta Mura, MD  bisacodyl (DULCOLAX) 10 MG suppository Place 1 suppository (10 mg total) rectally daily as needed for moderate constipation. 05/07/22  Yes Jerald Kief, MD  carbidopa-levodopa (SINEMET IR) 25-100 MG tablet Take 0.5 tablets by mouth 3 (three) times daily. 07/10/21  Yes Rhetta Mura, MD  clindamycin (CLEOCIN) 300 MG capsule Take 1 capsule (300 mg total) by mouth every 6 (six) hours. 12/09/22  Yes Leyland Kenna H, PA-C  divalproex (DEPAKOTE SPRINKLE) 125 MG capsule Take 4 capsules (500 mg total) by mouth daily. Taken at 10am Patient taking differently: Take 375-500 mg by mouth See admin instructions. Take 500 mg by mouth at 10 AM every day and 375 mg at 4 PM & 10 PM 05/08/22 12/09/22 Yes Jerald Kief, MD  levothyroxine (SYNTHROID) 75 MCG tablet Take 75 mcg by mouth daily before breakfast. 06/15/21  Yes [provider]  NON  FORMULARY Take 1 Can by mouth See admin instructions. Mighty Shake- Drink 1 shake by mouth three times a day   Yes [provider]  OLANZapine (ZYPREXA) 15 MG tablet Take 7.5 mg by mouth 2 (two) times daily. 02/14/22  Yes [provider]  thiamine 100 MG tablet Take 1 tablet (100 mg total) by mouth daily. 05/17/20  Yes Marlin Canary U, DO  zinc oxide 20 % ointment Apply 1 Application topically See admin instructions. Apply as directed to the buttocks every shift   Yes [provider]  amoxicillin-clavulanate (AUGMENTIN) 875-125 MG tablet Take 1 tablet by mouth every 12 (twelve) hours. Patient not taking: Reported on 12/09/2022 11/01/22   Gwyneth Sprout, MD  divalproex (DEPAKOTE SPRINKLE) 125 MG capsule Take 3 capsules (375 mg total) by mouth 2 (two) times daily. Taken at 4pm and 10pm Patient not taking: Reported on 12/09/2022 05/07/22 12/09/22  Jerald Kief, MD  polyethylene glycol (MIRALAX / GLYCOLAX) 17 g packet Take 17 g by mouth daily. Patient not taking: Reported on 12/09/2022 05/08/22   Jerald Kief, MD      Allergies    Patient has no known allergies.    Review of Systems   Review of Systems  Reason unable to perform ROS: Dementia.  Skin:  Positive for wound.    Physical Exam Updated Vital Signs BP 129/73   Pulse 77   Temp 97.6 F (36.4 C)   Resp 18   SpO2 100%  Physical Exam Vitals  and nursing note reviewed.  Constitutional:      General: He is not in acute distress.    Comments: Alert to baseline, response with noise  HENT:     Head: Normocephalic and atraumatic.  Eyes:     General:        Right eye: No discharge.        Left eye: No discharge.  Cardiovascular:     Rate and Rhythm: Normal rate and regular rhythm.     Heart sounds: No murmur heard.    No friction rub. No gallop.  Pulmonary:     Effort: Pulmonary effort is normal.     Breath sounds: Normal breath sounds.  Abdominal:     General: Bowel sounds are normal.     Palpations:  Abdomen is soft.  Skin:    General: Skin is warm and dry.     Capillary Refill: Capillary refill takes less than 2 seconds.     Comments: Patient with significant soft tissue swelling, fluctuance, over left eye. On eye opening with intact EOMs, no proptosis noted. Normal appearance of sclera.  Neurological:     Mental Status: Mental status is at baseline.  Psychiatric:        Mood and Affect: Mood normal.        Behavior: Behavior normal.     ED Results / Procedures / Treatments   Labs (all labs ordered are listed, but only abnormal results are displayed) Labs Reviewed  CBC - Abnormal; Notable for the following components:      Result Value   RBC 3.32 (*)    Hemoglobin 11.0 (*)    HCT 33.9 (*)    MCV 102.1 (*)    All other components within normal limits  BASIC METABOLIC PANEL - Abnormal; Notable for the following components:   Calcium 8.6 (*)    All other components within normal limits  I-STAT CG4 LACTIC ACID, ED - Abnormal; Notable for the following components:   Lactic Acid, Venous 0.4 (*)    All other components within normal limits  I-STAT CG4 LACTIC ACID, ED    EKG None  Radiology DG Hand Complete Right  Result Date: 12/09/2022 CLINICAL DATA:  Hand swelling and pain EXAM: RIGHT HAND - COMPLETE 3+ VIEW COMPARISON:  None Available. FINDINGS: The hand is clenched into a fist, causing bony overlap and severe reduction in diagnostic sensitivity and specificity. Motion artifact also blurs imaging, especially on the oblique projection. The patient has dementia and is unable to cooperate with imaging. There is a faintly visible oblique lucency tracking along the proximal metaphysis of the ring finger metacarpal, suspicious for a fracture. Mild degenerative findings at the first carpometacarpal articulation. IMPRESSION: 1. Suspected oblique fracture of the proximal metaphysis of the ring finger metacarpal. 2. The hand is clenched into a fist, causing bony overlap and severe  reduction in diagnostic sensitivity and specificity. 3. Mild degenerative findings at the first carpometacarpal articulation. Electronically Signed   By: Gaylyn Rong M.D.   On: 12/09/2022 20:47   CT Orbits W Contrast  Result Date: 12/09/2022 CLINICAL DATA:  Periorbital cellulitis EXAM: CT ORBITS WITH CONTRAST TECHNIQUE: Multidetector CT images was performed according to the standard protocol following intravenous contrast administration. RADIATION DOSE REDUCTION: This exam was performed according to the departmental dose-optimization program which includes automated exposure control, adjustment of the mA and/or kV according to patient size and/or use of iterative reconstruction technique. CONTRAST:  80mL OMNIPAQUE IOHEXOL 300 MG/ML  SOLN COMPARISON:  CT max/face 11/01/22 FINDINGS: Orbits: Bilateral lens replacement. No orbital mass or evidence of inflammation. Normal appearance of the globes, optic nerve-sheath complexes, extraocular muscles, orbital fat and lacrimal glands. Visible paranasal sinuses: Polypoid mucosal thickening in the left maxillary sinus. Trace mucosal thickening in the right sphenoid sinus. No middle ear or mastoid 1 effusion. Soft tissues: There is soft tissue swelling in the supraorbital soft tissues on the left. Within this region of soft tissue swelling there is a relative area of hypoenhancement (series 9, image 44) measuring 1.9 x 1.0 cm. This could represent a soft tissue abscess. This area tracks inferiorly to the level of the left eyelid without evidence of postseptal extension. There is also asymmetric soft tissue thickening along the left parotid gland (series 9, image 9), new/increased compared to 12/04/2022. This could represent evolving posttraumatic changes, possibly involving the left parotid gland versus infection. Osseous: No fracture or aggressive lesion. Limited intracranial: No acute or significant finding. IMPRESSION: 1. Soft tissue swelling in the supraorbital soft  tissues on the left with a relative area of hypoenhancement measuring 1.9 x 1.0 cm. This could represent a soft tissue abscess. This area tracks inferiorly to the level of the left eyelid without evidence of postseptal extension. 2. Asymmetric soft tissue thickening along the left parotid gland, new/increased compared to 12/04/2022. This could represent evolving posttraumatic changes, possibly involving the left parotid gland, versus infection. Electronically Signed   By: Lorenza Cambridge M.D.   On: 12/09/2022 18:36    Procedures Procedures    Medications Ordered in ED Medications  LORazepam (ATIVAN) tablet 1 mg (has no administration in time range)  clindamycin (CLEOCIN) IVPB 600 mg (0 mg Intravenous Stopped 12/09/22 1830)  iohexol (OMNIPAQUE) 300 MG/ML solution 80 mL (80 mLs Intravenous Contrast Given 12/09/22 1739)    ED Course/ Medical Decision Making/ A&P                                 Medical Decision Making Amount and/or Complexity of Data Reviewed Labs: ordered. Radiology: ordered.  Risk Prescription drug management.   This patient is a 77 y.o. male who presents to the ED for concern of facial swelling, this involves an extensive number of treatment options, and is a complaint that carries with it a high risk of complications and morbidity. The emergent differential diagnosis prior to evaluation includes, but is not limited to,  concern for orbital cellulitis, periorbital cellulitis vs overt sepsis, vs other . This is not an exhaustive differential.   Past Medical History / Co-morbidities / Social History: Warnicke Korsakoff syndrome, history of alcohol abuse, acute metabolic encephalopathy, dementia at baseline  Additional history: Chart reviewed. Pertinent results include: Reviewed lab work, imaging from recent emergency department visits, notably with several recent falls  Physical Exam: Physical exam performed. The pertinent findings include: Patient has some soft tissue  swelling without significant step-off of the dorsum of the right hand.  He is demented compared to his baseline, and moving all 4 limbs spontaneously.  Patient with significant soft tissue swelling, fluctuance, over left eye. On eye opening with intact EOMs, no proptosis noted. Normal appearance of sclera.  Lab Tests: I ordered, and personally interpreted labs.  The pertinent results include: CBC with mild anemia, hemoglobin 11, white blood cells 10, notably with no leukocytosis unremarkable, undetectable lactic acid.   Imaging Studies: I ordered imaging studies including CT orbits with contrast, and plain film x-ray of the  right hand. I independently visualized and interpreted imaging which showed patient with some facial cellulitis but with no evidence of orbital cellulitis, right hand x-ray shows a fourth metacarpal fracture. I agree with the radiologist interpretation.  Medications: I ordered medication including IV clindamycin for facial cellulitis.  Discussed with family, and patient is able to take oral medications, I think is reasonable to discharge him with oral antibiotics.   Disposition: After consideration of the diagnostic results and the patients response to treatment, I feel that patient has both a fracture of fourth metacarpal and facial cellulitis without orbital cellulitis . Will splint and treat with oral abx. Extensive return precautions given.  emergency department workup does not suggest an emergent condition requiring admission or immediate intervention beyond what has been performed at this time. The plan is: as above. The patient is safe for discharge and has been instructed to return immediately for worsening symptoms, change in symptoms or any other concerns.  I discussed this case with my attending physician Dr. Maple Hudson who cosigned this note including patient's presenting symptoms, physical exam, and planned diagnostics and interventions. Attending physician stated  agreement with plan or made changes to plan which were implemented.    Final Clinical Impression(s) / ED Diagnoses Final diagnoses:  Facial cellulitis  Closed nondisplaced fracture of shaft of fourth metacarpal bone of right hand, initial encounter    Rx / DC Orders ED Discharge Orders          Ordered    clindamycin (CLEOCIN) 300 MG capsule  Every 6 hours        12/09/22 2100              Montasia Chisenhall, [place] H, PA-C 12/09/22 2123    Coral Spikes, DO 12/09/22 2313

## 2022-12-09 NOTE — ED Notes (Signed)
[facility] called for pt transport

## 2022-12-09 NOTE — Discharge Instructions (Signed)
Please take the entire course of antibiotics that I prescribed for your cellulitis.  We have placed patient in a splint to recommend that you follow-up with the orthopedic surgeon whose contact information I provided to assess whether patient may need additional management or possible surgery of the hand.  He can use Tylenol as needed for pain.  Please follow-up if you have any concern for worsening cellulitis of the face.

## 2022-12-10 ENCOUNTER — Emergency Department (HOSPITAL_COMMUNITY)
Admission: EM | Admit: 2022-12-10 | Discharge: 2022-12-10 | Disposition: A | Discharge status: Home / Self Care | Source: Home / Self Care | Attending: Emergency Medicine | Admitting: Emergency Medicine

## 2022-12-10 ENCOUNTER — Emergency Department (HOSPITAL_COMMUNITY)

## 2022-12-10 ENCOUNTER — Other Ambulatory Visit: Payer: Self-pay

## 2022-12-10 ENCOUNTER — Encounter (HOSPITAL_COMMUNITY): Payer: Self-pay

## 2022-12-10 DIAGNOSIS — W1830XA Fall on same level, unspecified, initial encounter: Secondary | ICD-10-CM | POA: Insufficient documentation

## 2022-12-10 DIAGNOSIS — H44002 Unspecified purulent endophthalmitis, left eye: Secondary | ICD-10-CM

## 2022-12-10 DIAGNOSIS — F0390 Unspecified dementia without behavioral disturbance: Secondary | ICD-10-CM | POA: Insufficient documentation

## 2022-12-10 DIAGNOSIS — W19XXXA Unspecified fall, initial encounter: Secondary | ICD-10-CM

## 2022-12-10 DIAGNOSIS — H00036 Abscess of eyelid left eye, unspecified eyelid: Secondary | ICD-10-CM | POA: Insufficient documentation

## 2022-12-10 DIAGNOSIS — K5900 Constipation, unspecified: Secondary | ICD-10-CM

## 2022-12-10 MED ORDER — CLINDAMYCIN HCL 300 MG PO CAPS
300.0000 mg | ORAL_CAPSULE | Freq: Once | ORAL | Status: AC
  Administered 2022-12-10: 300 mg via ORAL
  Filled 2022-12-10: qty 1

## 2022-12-10 NOTE — Discharge Instructions (Addendum)
You have infection of your left eye, I have started him on antibiotics please take as prescribed, if you do not see improvement after 3 to 4 days of antibiotic use patient will need to come back in for further assessment.  I recommend follow-up with ophthalmology for further evaluation  Come back to the emergency department if you develop chest pain, shortness of breath, severe abdominal pain, uncontrolled nausea, vomiting, diarrhea.  Your x-ray showed some constipation. Take miralax daily.   Your CT scan also showed a nodular area in your lung. Please follow-up with PCP for further evaluation.

## 2022-12-10 NOTE — ED Provider Notes (Signed)
[facility] AT [facility] Provider Note   CSN: [id number] Arrival date & time: 12/10/22  6962     History  Chief Complaint  Patient presents with   Marletta Lor    Mensah Gosnell is a 77 y.o. male.  HPI   Patient with medical history including bipolar, alcohol induced dementia, tremors, Warnicke encephalopathy, history of seizures, presented after a fall.  Unable to obtain HPI due to patient's comorbidities, per EMS patient had mechanical fall, unwitnessed, unclear how long he is on the floor.  Called nursing facility multiple times without success    Home Medications Prior to Admission medications   Medication Sig Start Date End Date Taking? Authorizing Provider  acetaminophen (TYLENOL) 325 MG tablet Take 2 tablets (650 mg total) by mouth every 6 (six) hours as needed for mild pain (or Fever >/= 101). 07/04/21   Rhetta Mura, MD  amoxicillin-clavulanate (AUGMENTIN) 875-125 MG tablet Take 1 tablet by mouth every 12 (twelve) hours. Patient not taking: Reported on 12/09/2022 11/01/22   Gwyneth Sprout, MD  bisacodyl (DULCOLAX) 10 MG suppository Place 1 suppository (10 mg total) rectally daily as needed for moderate constipation. 05/07/22   Jerald Kief, MD  carbidopa-levodopa (SINEMET IR) 25-100 MG tablet Take 0.5 tablets by mouth 3 (three) times daily. 07/10/21   Rhetta Mura, MD  clindamycin (CLEOCIN) 300 MG capsule Take 1 capsule (300 mg total) by mouth every 6 (six) hours. 12/09/22   Prosperi, Christian H, PA-C  divalproex (DEPAKOTE SPRINKLE) 125 MG capsule Take 3 capsules (375 mg total) by mouth 2 (two) times daily. Taken at 4pm and 10pm Patient not taking: Reported on 12/09/2022 05/07/22 12/09/22  Jerald Kief, MD  divalproex (DEPAKOTE SPRINKLE) 125 MG capsule Take 4 capsules (500 mg total) by mouth daily. Taken at 10am Patient taking differently: Take 375-500 mg by mouth See admin instructions. Take 500 mg by mouth at 10 AM every day and 375 mg  at 4 PM & 10 PM 05/08/22 12/09/22  Jerald Kief, MD  levothyroxine (SYNTHROID) 75 MCG tablet Take 75 mcg by mouth daily before breakfast. 06/15/21   [provider]  NON FORMULARY Take 1 Can by mouth See admin instructions. Mighty Shake- Drink 1 shake by mouth three times a day    [provider]  OLANZapine (ZYPREXA) 15 MG tablet Take 7.5 mg by mouth 2 (two) times daily. 02/14/22   [provider]  polyethylene glycol (MIRALAX / GLYCOLAX) 17 g packet Take 17 g by mouth daily. Patient not taking: Reported on 12/09/2022 05/08/22   Jerald Kief, MD  thiamine 100 MG tablet Take 1 tablet (100 mg total) by mouth daily. 05/17/20   Joseph Art, DO  zinc oxide 20 % ointment Apply 1 Application topically See admin instructions. Apply as directed to the buttocks every shift    [provider]      Allergies    Patient has no known allergies.    Review of Systems   Review of Systems  Unable to perform ROS: Dementia    Physical Exam Updated Vital Signs BP (!) 141/73 (BP Location: Right Arm)   Pulse (!) 53   Resp 18   Ht 6\' 2"  (1.88 m)   Wt 72.6 kg   SpO2 99%   BMI 20.54 kg/m  Physical Exam Vitals and nursing note reviewed.  Constitutional:      General: He is not in acute distress.    Appearance: He is not ill-appearing.  HENT:     Head: Normocephalic and atraumatic.     Comments: Patient is noted periorbital edema over the left eye, with small open wound of the left eyebrow, no Battle sign present no other evidence of trauma on my exam    Nose: No congestion.     Mouth/Throat:     Mouth: Mucous membranes are moist.     Pharynx: Oropharynx is clear. No oropharyngeal exudate or posterior oropharyngeal erythema.     Comments: No trismus no precautions no oral trauma present my exam. Eyes:     Conjunctiva/sclera: Conjunctivae normal.     Comments: Patient has significant left-sided periorbital edema with erythema present, crusting around the eyelid,  patient left eye appears to be deviated to the left nasal aspect of the face, no noted blood in the anterior chain of the eye, no scleral injection, PERRLA  Right eye no proptosis, no periorbital edema, no scleral injection, no blood noted anterior trim the eye, EOMs appear to be intact but difficult as patient would not follow commands.  Cardiovascular:     Rate and Rhythm: Normal rate and regular rhythm.     Pulses: Normal pulses.     Heart sounds: No murmur heard.    No friction rub. No gallop.  Pulmonary:     Effort: No respiratory distress.     Breath sounds: No wheezing, rhonchi or rales.     Comments: No obvious trauma to the chest chest is nontender lung sounds are clear Abdominal:     Palpations: Abdomen is soft.     Tenderness: There is no abdominal tenderness. There is no right CVA tenderness or left CVA tenderness.     Comments: No obvious trauma to the abdomen abdomen is soft nontender.  Musculoskeletal:     Comments: Spine palpated was nontender to palpation no step-off deformities noted, no palpable instability no leg shortening.  Skin:    General: Skin is warm and dry.  Neurological:     Mental Status: He is alert.  Psychiatric:        Mood and Affect: Mood normal.        ED Results / Procedures / Treatments   Labs (all labs ordered are listed, but only abnormal results are displayed) Labs Reviewed - No data to display  EKG None  Radiology DG Chest 1 View  Result Date: 12/10/2022 CLINICAL DATA:  Nonverbal patient with unwitnessed fall at nursing home. EXAM: CHEST  1 VIEW; PELVIS - 1-2 VIEW COMPARISON:  PA and lateral chest 06/30/2022, AP pelvis 06/30/2022. FINDINGS: Chest AP portable at 6:09 a.m.: The cardiomediastinal silhouette and vascular pattern are normal. There is calcific plaque in the aortic arch. The lungs are clear. No pleural effusion is seen. There is no visible pneumothorax. There is osteopenia. No displaced rib fracture is evident. AP pelvis,  single view: There is no evidence of pelvic fracture or diastasis. No pelvic bone lesions are seen. There is mild symmetric arthrosis of the hips, mild enthesopathic changes of the pelvis. There is aortoiliac calcific plaque. Moderate retained stool in the rectum. Compare: Both of the above studies are unchanged compared to the previous exams. IMPRESSION: 1. No evidence of acute chest disease. 2. No evidence of pelvic fracture or diastasis. 3. Aortoiliac calcific plaque. 4. Moderate retained stool in the rectum. Electronically Signed   By: Almira Bar M.D.   On: 12/10/2022 06:18   DG Pelvis 1-2 Views  Result Date: 12/10/2022 CLINICAL DATA:  Nonverbal patient with unwitnessed fall  at nursing home. EXAM: CHEST  1 VIEW; PELVIS - 1-2 VIEW COMPARISON:  PA and lateral chest 06/30/2022, AP pelvis 06/30/2022. FINDINGS: Chest AP portable at 6:09 a.m.: The cardiomediastinal silhouette and vascular pattern are normal. There is calcific plaque in the aortic arch. The lungs are clear. No pleural effusion is seen. There is no visible pneumothorax. There is osteopenia. No displaced rib fracture is evident. AP pelvis, single view: There is no evidence of pelvic fracture or diastasis. No pelvic bone lesions are seen. There is mild symmetric arthrosis of the hips, mild enthesopathic changes of the pelvis. There is aortoiliac calcific plaque. Moderate retained stool in the rectum. Compare: Both of the above studies are unchanged compared to the previous exams. IMPRESSION: 1. No evidence of acute chest disease. 2. No evidence of pelvic fracture or diastasis. 3. Aortoiliac calcific plaque. 4. Moderate retained stool in the rectum. Electronically Signed   By: Almira Bar M.D.   On: 12/10/2022 06:18   DG Hand Complete Right  Result Date: 12/09/2022 CLINICAL DATA:  Hand swelling and pain EXAM: RIGHT HAND - COMPLETE 3+ VIEW COMPARISON:  None Available. FINDINGS: The hand is clenched into a fist, causing bony overlap and severe  reduction in diagnostic sensitivity and specificity. Motion artifact also blurs imaging, especially on the oblique projection. The patient has dementia and is unable to cooperate with imaging. There is a faintly visible oblique lucency tracking along the proximal metaphysis of the ring finger metacarpal, suspicious for a fracture. Mild degenerative findings at the first carpometacarpal articulation. IMPRESSION: 1. Suspected oblique fracture of the proximal metaphysis of the ring finger metacarpal. 2. The hand is clenched into a fist, causing bony overlap and severe reduction in diagnostic sensitivity and specificity. 3. Mild degenerative findings at the first carpometacarpal articulation. Electronically Signed   By: Gaylyn Rong M.D.   On: 12/09/2022 20:47   CT Orbits W Contrast  Result Date: 12/09/2022 CLINICAL DATA:  Periorbital cellulitis EXAM: CT ORBITS WITH CONTRAST TECHNIQUE: Multidetector CT images was performed according to the standard protocol following intravenous contrast administration. RADIATION DOSE REDUCTION: This exam was performed according to the departmental dose-optimization program which includes automated exposure control, adjustment of the mA and/or kV according to patient size and/or use of iterative reconstruction technique. CONTRAST:  80mL OMNIPAQUE IOHEXOL 300 MG/ML  SOLN COMPARISON:  CT max/face 11/01/22 FINDINGS: Orbits: Bilateral lens replacement. No orbital mass or evidence of inflammation. Normal appearance of the globes, optic nerve-sheath complexes, extraocular muscles, orbital fat and lacrimal glands. Visible paranasal sinuses: Polypoid mucosal thickening in the left maxillary sinus. Trace mucosal thickening in the right sphenoid sinus. No middle ear or mastoid 1 effusion. Soft tissues: There is soft tissue swelling in the supraorbital soft tissues on the left. Within this region of soft tissue swelling there is a relative area of hypoenhancement (series 9, image 44)  measuring 1.9 x 1.0 cm. This could represent a soft tissue abscess. This area tracks inferiorly to the level of the left eyelid without evidence of postseptal extension. There is also asymmetric soft tissue thickening along the left parotid gland (series 9, image 9), new/increased compared to 12/04/2022. This could represent evolving posttraumatic changes, possibly involving the left parotid gland versus infection. Osseous: No fracture or aggressive lesion. Limited intracranial: No acute or significant finding. IMPRESSION: 1. Soft tissue swelling in the supraorbital soft tissues on the left with a relative area of hypoenhancement measuring 1.9 x 1.0 cm. This could represent a soft tissue abscess. This area tracks inferiorly  to the level of the left eyelid without evidence of postseptal extension. 2. Asymmetric soft tissue thickening along the left parotid gland, new/increased compared to 12/04/2022. This could represent evolving posttraumatic changes, possibly involving the left parotid gland, versus infection. Electronically Signed   By: Lorenza Cambridge M.D.   On: 12/09/2022 18:36    Procedures Procedures    Medications Ordered in ED Medications  clindamycin (CLEOCIN) capsule 300 mg (300 mg Oral Given 12/10/22 4098)    ED Course/ Medical Decision Making/ A&P                                 Medical Decision Making Amount and/or Complexity of Data Reviewed Radiology: ordered.  Risk Prescription drug management.   This patient presents to the ED for concern of fall, this involves an extensive number of treatment options, and is a complaint that carries with it a high risk of complications and morbidity.  The differential diagnosis includes intracranial bleed thoracic/abdominal trauma, orthopedic injury    Additional history obtained:  Additional history obtained from patient's son, EMS External records from outside source obtained and reviewed including recent ER notes   Co morbidities  that complicate the patient evaluation  Dementia, seizures  Social Determinants of Health:  Geriatric   Lab Tests:  I Ordered, and personally interpreted labs.  The pertinent results include: N/A   Imaging Studies ordered:  I ordered imaging studies including DG chest, pelvis, CT head, maxillofacial, cervical spine I independently visualized and interpreted imaging which showed plain flims for unremarkable I agree with the radiologist interpretation   Cardiac Monitoring:  The patient was maintained on a cardiac monitor.  I personally viewed and interpreted the cardiac monitored which showed an underlying rhythm of: n/a   Medicines ordered and prescription drug management:  I ordered medication including n/a I have reviewed the patients home medicines and have made adjustments as needed  Critical Interventions:  N/a   Reevaluation:  Presents after a fall, will obtain scans for further assessment  Spoke with patient's son, explained the patient has cellulitis over the left eye, I offered admission for IV antibiotics versus outpatient trial of oral antibiotics,  patient son states that he rather patient go to nursing facility as he tends to do better there then the hospital, I find this reasonable, as long as there is no new injuries, patient be discharged home on antibiotics    Consultations Obtained:  N/A    Test Considered:  Hospital admission-with shared decision making with son this will be deferred as they like to try an outpatient management.    Rule out  Low suspicion for spinal cord abnormality or spinal fracture spine was palpated was nontender to palpation, patient has full range of motion in the upper and lower extremities.  I doubt thoracic/abdominal trauma no evidence of trauma on exam both were nontender palpation.  Doubt hyphema there is no noted blood in the anterior chain of the eye.  Patient has noted laceration above the left eyebrow, I spoke  this is likely the wound that was opened up from prior visits, will defer on suture repair as I do not want to close in the actively draining abscess.  I doubt CVA no focal deficit present my exam.  Suspicion for sepsis is low at this time vital signs are reassuring, patient had CBC as well as lactate perform less than 24 hours ago which were negative.  Dispostion and problem list  Due to shift change patient handoff to [facility] abdomen PA-C  Follow-up on CT scans, if unremarkable patient can be discharged home, continue with antibiotics for abscess above the left eyebrow.            Final Clinical Impression(s) / ED Diagnoses Final diagnoses:  Fall, initial encounter  Abscess of left eye    Rx / DC Orders ED Discharge Orders     None         Barnie Del 12/10/22 0731    Tilden Fossa, MD 12/11/22 [phone]

## 2022-12-10 NOTE — ED Triage Notes (Signed)
Pt c/o laceration to left eyebrow. Pt had an unwitnessed fall and was found in hallway on floor. Pt is non-verbal, pt was seen here earlier for the same.

## 2022-12-10 NOTE — ED Provider Notes (Signed)
Care assumed from Berle Mull, PA-C at shift change pending CT scans. See his note for full HPI.  In short, patient is a 77 year old male presents to the ED after a fall.  History of numerous falls.  Seen yesterday for abscess to forehead where patient was discharged with antibiotics.  Devious provider spoke to son who declined admission for IV antibiotics and prefers patient to be discharged on oral antibiotics pending normal CT scans.  At shift change, CT scans are pending.  Physical Exam  BP (!) 141/73 (BP Location: Right Arm)   Pulse (!) 53   Resp 18   Ht 6\' 2"  (1.88 m)   Wt 72.6 kg   SpO2 99%   BMI 20.54 kg/m   Physical Exam Vitals and nursing note reviewed.  Constitutional:      General: He is not in acute distress.    Appearance: He is not ill-appearing.  HENT:     Head: Normocephalic.  Eyes:     Pupils: Pupils are equal, round, and reactive to light.     Comments: Erythema surrounding left eye. See photo below.   Cardiovascular:     Rate and Rhythm: Normal rate and regular rhythm.     Pulses: Normal pulses.     Heart sounds: Normal heart sounds. No murmur heard.    No friction rub. No gallop.  Pulmonary:     Effort: Pulmonary effort is normal.     Breath sounds: Normal breath sounds.  Abdominal:     General: Abdomen is flat. There is no distension.     Palpations: Abdomen is soft.     Tenderness: There is no abdominal tenderness. There is no guarding or rebound.  Musculoskeletal:        General: Normal range of motion.     Cervical back: Neck supple.  Skin:    General: Skin is warm and dry.  Neurological:     General: No focal deficit present.     Mental Status: He is alert.  Psychiatric:        Mood and Affect: Mood normal.        Behavior: Behavior normal.     Procedures  Procedures  ED Course / MDM    Medical Decision Making Amount and/or Complexity of Data Reviewed Independent Historian: caregiver    Details: Discussed with son Labs:  ordered. Decision-making details documented in ED Course. Radiology: ordered and independent interpretation performed. Decision-making details documented in ED Course.  Risk Prescription drug management.   Care assumed from Berle Mull, PA-C at shift change pending CT scans. See his note for full MDM.   Patient presents today after a fall. History of frequent falls.   Reviewed labs from yesterday.  No leukocytosis. Low suspicion for sepsis. Patient discharged with clindamycin.   CT scans personally reviewed and interpreted which are negative for any acute abnormalities.  Does demonstrate periorbital soft tissue swelling of the left eye.  Already on oral antibiotics.  No bony fractures.  Does also demonstrate a nodular area in the right upper lobe.  Discussed with son on the phone for further PCP follow-up.  Chest x-ray and pelvis x-ray negative for any acute abnormalities.  Does demonstrate some stool in rectum. Patient is non-tender on exam. Advised patient to take Miralax daily.   8:05 AM Spoke to patient's son Vikrant on the phone to discuss CT scans. I also discussed my concern for his left eye infection. Son still declined admission for IV antibiotics and prefers to  try oral antibiotics which he was started on yesterday. Son is aware that if infection gets worse, he will need to return to the ED for IV antibiotics. He is also aware that the infection could go deep into the eye and become an emergency. He does not want patient admitted at this time due to concerns about delirium during admission.    Would thoroughly cleaned by RN. Tetanus last given on 06/30/2022. Patient at his baseline per son. Son is going to speak to hospice to increase his level of care. Patient stable for discharge. Strict ED precautions discussed with patient. Patient states understanding and agrees to plan. Patient discharged home in no acute distress and stable vitals     Jesusita Oka 12/10/22  0831    Rolan Bucco, MD 12/10/22 1546

## 2022-12-10 NOTE — ED Notes (Signed)
Face and wound cleansed with peroxide/saline solution Dressing applied with xeroform, guaze and tape

## 2022-12-10 NOTE — ED Notes (Signed)
[facility] contacted for ride setup

## 2022-12-11 ENCOUNTER — Emergency Department (HOSPITAL_COMMUNITY)

## 2022-12-11 ENCOUNTER — Other Ambulatory Visit: Payer: Self-pay

## 2022-12-11 ENCOUNTER — Encounter (HOSPITAL_COMMUNITY): Payer: Self-pay

## 2022-12-11 ENCOUNTER — Emergency Department (HOSPITAL_COMMUNITY)
Admission: EM | Admit: 2022-12-11 | Discharge: 2022-12-12 | Disposition: A | Discharge status: Home / Self Care | Attending: Emergency Medicine | Admitting: Emergency Medicine

## 2022-12-11 DIAGNOSIS — F0390 Unspecified dementia without behavioral disturbance: Secondary | ICD-10-CM | POA: Diagnosis not present

## 2022-12-11 DIAGNOSIS — W19XXXA Unspecified fall, initial encounter: Secondary | ICD-10-CM

## 2022-12-11 DIAGNOSIS — S0181XA Laceration without foreign body of other part of head, initial encounter: Secondary | ICD-10-CM | POA: Insufficient documentation

## 2022-12-11 DIAGNOSIS — S0083XA Contusion of other part of head, initial encounter: Secondary | ICD-10-CM

## 2022-12-11 DIAGNOSIS — S01511A Laceration without foreign body of lip, initial encounter: Secondary | ICD-10-CM | POA: Diagnosis present

## 2022-12-11 DIAGNOSIS — F03C0 Unspecified dementia, severe, without behavioral disturbance, psychotic disturbance, mood disturbance, and anxiety: Secondary | ICD-10-CM

## 2022-12-11 DIAGNOSIS — W0110XA Fall on same level from slipping, tripping and stumbling with subsequent striking against unspecified object, initial encounter: Secondary | ICD-10-CM | POA: Insufficient documentation

## 2022-12-11 NOTE — ED Provider Notes (Signed)
[facility] AT [facility] Provider Note   CSN: [id number] Arrival date & time: 12/11/22  2000     History  Chief Complaint  Patient presents with   Marletta Lor    Trevor Cook is a 77 y.o. male.  HPI Patient has severe dementia and very frequent falls.  The patient fell forward and fell directly on his face.  Patient cannot give any additional history.  Not anticoagulated.    Home Medications Prior to Admission medications   Medication Sig Start Date End Date Taking? Authorizing Provider  acetaminophen (TYLENOL) 325 MG tablet Take 2 tablets (650 mg total) by mouth every 6 (six) hours as needed for mild pain (or Fever >/= 101). 07/04/21  Yes Rhetta Mura, MD  bisacodyl (DULCOLAX) 10 MG suppository Place 1 suppository (10 mg total) rectally daily as needed for moderate constipation. 05/07/22  Yes Jerald Kief, MD  carbidopa-levodopa (SINEMET IR) 25-100 MG tablet Take 0.5 tablets by mouth 3 (three) times daily. 07/10/21  Yes Rhetta Mura, MD  clindamycin (CLEOCIN) 300 MG capsule Take 1 capsule (300 mg total) by mouth every 6 (six) hours. 12/09/22  Yes Prosperi, Christian H, PA-C  divalproex (DEPAKOTE SPRINKLE) 125 MG capsule Take 4 capsules (500 mg total) by mouth daily. Taken at 10am Patient taking differently: Take 375-500 mg by mouth See admin instructions. Take 500 mg by mouth at 10 AM every day and 375 mg at 4 PM & 10 PM 05/08/22 12/11/22 Yes Jerald Kief, MD  doxycycline (ADOXA) 100 MG tablet Take 100 mg by mouth 2 (two) times daily. For 10 days.   Yes [provider]  levothyroxine (SYNTHROID) 75 MCG tablet Take 75 mcg by mouth daily before breakfast. 06/15/21  Yes [provider]  NON FORMULARY Take 1 Can by mouth See admin instructions. Mighty Shake- Drink 1 shake by mouth three times a day   Yes [provider]  OLANZapine (ZYPREXA) 15 MG tablet Take 7.5 mg by mouth 2 (two) times daily. 02/14/22  Yes [provider]  thiamine 100 MG tablet Take 1 tablet (100 mg total) by mouth daily. 05/17/20  Yes Marlin Canary U, DO  zinc oxide 20 % ointment Apply 1 Application topically See admin instructions. Apply as directed to the buttocks every shift   Yes [provider]  amoxicillin-clavulanate (AUGMENTIN) 875-125 MG tablet Take 1 tablet by mouth every 12 (twelve) hours. Patient not taking: Reported on 12/09/2022 11/01/22   Gwyneth Sprout, MD  divalproex (DEPAKOTE SPRINKLE) 125 MG capsule Take 3 capsules (375 mg total) by mouth 2 (two) times daily. Taken at 4pm and 10pm Patient not taking: Reported on 12/09/2022 05/07/22 12/09/22  Jerald Kief, MD  polyethylene glycol (MIRALAX / GLYCOLAX) 17 g packet Take 17 g by mouth daily. Patient not taking: Reported on 12/09/2022 05/08/22   Jerald Kief, MD      Allergies    Patient has no known allergies.    Review of Systems   Review of Systems  Physical Exam Updated Vital Signs BP 137/77 (BP Location: Right Arm)   Pulse 60   Temp 97.8 F (36.6 C) (Oral)   Resp 12   SpO2 97%  Physical Exam Constitutional:      Comments: Patient is awake, no respiratory distress.  Does not make verbal responses except for occasional vocalization.  HENT:     Head:     Comments: Persistent swelling and draining dehisced laceration over the left brow.  Patient  now also has diffuse swelling of the nasal bridge and small, nonbleeding laceration.  Swelling of the upper lip with small amount of laceration of the mucosa. Cardiovascular:     Rate and Rhythm: Normal rate.  Pulmonary:     Effort: Pulmonary effort is normal.     Comments: No chest wall crepitus.  No apparent pain with compression of chest wall Abdominal:     General: There is no distension.     Palpations: Abdomen is soft.     Tenderness: There is no abdominal tenderness.  Musculoskeletal:     Comments: Patient is wearing a splint on the right wrist.  Apparent deformities of the right upper  extremity.  No deformities of the lower extremities.  I put the lower extremities to range of motion of the hip ankle knee no apparent significant pain or limitation.  Skin:    General: Skin is warm and dry.  Neurological:     Comments: Patient has very limited responses.  Will occasionally make a vocalization but not responding to questions.  He is looking at me but does not seem to have any comprehension.  Does not follow commands for general testing.     ED Results / Procedures / Treatments   Labs (all labs ordered are listed, but only abnormal results are displayed) Labs Reviewed - No data to display  EKG None  Radiology CT Head Wo Contrast  Result Date: 12/11/2022 CLINICAL DATA:  Fall, facial injury EXAM: CT HEAD WITHOUT CONTRAST CT MAXILLOFACIAL WITHOUT CONTRAST CT CERVICAL SPINE WITHOUT CONTRAST TECHNIQUE: Multidetector CT imaging of the head, cervical spine, and maxillofacial structures were performed using the standard protocol without intravenous contrast. Multiplanar CT image reconstructions of the cervical spine and maxillofacial structures were also generated. RADIATION DOSE REDUCTION: This exam was performed according to the departmental dose-optimization program which includes automated exposure control, adjustment of the mA and/or kV according to patient size and/or use of iterative reconstruction technique. COMPARISON:  12/10/2022 FINDINGS: CT HEAD FINDINGS Brain: No evidence of acute infarction, hemorrhage, hydrocephalus, extra-axial collection or mass lesion/mass effect. Global cortical and central atrophy. Subcortical white matter and periventricular small vessel ischemic changes. Vascular: Intracranial atherosclerosis. Skull: Normal. Negative for fracture or focal lesion. Other: None. CT MAXILLOFACIAL FINDINGS Osseous: No evidence of maxillofacial fracture. Nasal bones are intact. Mandible is intact. Bilateral mandibular condyles are well-seated in the TMJs. Orbits: Bilateral  orbits, including the globes and retroconal soft tissues, are within normal limits. Sinuses: Minimal partial opacification of the left maxillary sinus. The visualized paranasal sinuses are otherwise clear. The mastoid air cells are unopacified. Soft tissues: Soft tissue swelling/laceration overlying the left frontal bone (series 3/image 81). Additional preseptal soft tissue swelling overlying the lateral left orbit (series 3/image 75). CT CERVICAL SPINE FINDINGS Alignment: Mild straightening of the cervical spine, likely positional. Skull base and vertebrae: No acute fracture. No primary bone lesion or focal pathologic process. Soft tissues and spinal canal: No prevertebral fluid or swelling. No visible canal hematoma. Disc levels: Mild degenerative changes of the mid cervical spine. Spinal canal is patent. Upper chest: Visualized lung apices are clear. Other: Visualized thyroid is unremarkable. IMPRESSION: Soft tissue swelling/laceration overlying the left frontal bone. Additional preseptal soft tissue swelling overlying the lateral left orbit. No acute intracranial abnormality. Atrophy with small vessel ischemic changes. No evidence of maxillofacial fracture. No traumatic injury to the cervical spine. Mild degenerative changes. Electronically Signed   By: Charline Bills M.D.   On: 12/11/2022 21:34   CT  Maxillofacial WO CM  Result Date: 12/11/2022 CLINICAL DATA:  Fall, facial injury EXAM: CT HEAD WITHOUT CONTRAST CT MAXILLOFACIAL WITHOUT CONTRAST CT CERVICAL SPINE WITHOUT CONTRAST TECHNIQUE: Multidetector CT imaging of the head, cervical spine, and maxillofacial structures were performed using the standard protocol without intravenous contrast. Multiplanar CT image reconstructions of the cervical spine and maxillofacial structures were also generated. RADIATION DOSE REDUCTION: This exam was performed according to the departmental dose-optimization program which includes automated exposure control, adjustment  of the mA and/or kV according to patient size and/or use of iterative reconstruction technique. COMPARISON:  12/10/2022 FINDINGS: CT HEAD FINDINGS Brain: No evidence of acute infarction, hemorrhage, hydrocephalus, extra-axial collection or mass lesion/mass effect. Global cortical and central atrophy. Subcortical white matter and periventricular small vessel ischemic changes. Vascular: Intracranial atherosclerosis. Skull: Normal. Negative for fracture or focal lesion. Other: None. CT MAXILLOFACIAL FINDINGS Osseous: No evidence of maxillofacial fracture. Nasal bones are intact. Mandible is intact. Bilateral mandibular condyles are well-seated in the TMJs. Orbits: Bilateral orbits, including the globes and retroconal soft tissues, are within normal limits. Sinuses: Minimal partial opacification of the left maxillary sinus. The visualized paranasal sinuses are otherwise clear. The mastoid air cells are unopacified. Soft tissues: Soft tissue swelling/laceration overlying the left frontal bone (series 3/image 81). Additional preseptal soft tissue swelling overlying the lateral left orbit (series 3/image 75). CT CERVICAL SPINE FINDINGS Alignment: Mild straightening of the cervical spine, likely positional. Skull base and vertebrae: No acute fracture. No primary bone lesion or focal pathologic process. Soft tissues and spinal canal: No prevertebral fluid or swelling. No visible canal hematoma. Disc levels: Mild degenerative changes of the mid cervical spine. Spinal canal is patent. Upper chest: Visualized lung apices are clear. Other: Visualized thyroid is unremarkable. IMPRESSION: Soft tissue swelling/laceration overlying the left frontal bone. Additional preseptal soft tissue swelling overlying the lateral left orbit. No acute intracranial abnormality. Atrophy with small vessel ischemic changes. No evidence of maxillofacial fracture. No traumatic injury to the cervical spine. Mild degenerative changes. Electronically  Signed   By: Charline Bills M.D.   On: 12/11/2022 21:34   CT Cervical Spine Wo Contrast  Result Date: 12/11/2022 CLINICAL DATA:  Fall, facial injury EXAM: CT HEAD WITHOUT CONTRAST CT MAXILLOFACIAL WITHOUT CONTRAST CT CERVICAL SPINE WITHOUT CONTRAST TECHNIQUE: Multidetector CT imaging of the head, cervical spine, and maxillofacial structures were performed using the standard protocol without intravenous contrast. Multiplanar CT image reconstructions of the cervical spine and maxillofacial structures were also generated. RADIATION DOSE REDUCTION: This exam was performed according to the departmental dose-optimization program which includes automated exposure control, adjustment of the mA and/or kV according to patient size and/or use of iterative reconstruction technique. COMPARISON:  12/10/2022 FINDINGS: CT HEAD FINDINGS Brain: No evidence of acute infarction, hemorrhage, hydrocephalus, extra-axial collection or mass lesion/mass effect. Global cortical and central atrophy. Subcortical white matter and periventricular small vessel ischemic changes. Vascular: Intracranial atherosclerosis. Skull: Normal. Negative for fracture or focal lesion. Other: None. CT MAXILLOFACIAL FINDINGS Osseous: No evidence of maxillofacial fracture. Nasal bones are intact. Mandible is intact. Bilateral mandibular condyles are well-seated in the TMJs. Orbits: Bilateral orbits, including the globes and retroconal soft tissues, are within normal limits. Sinuses: Minimal partial opacification of the left maxillary sinus. The visualized paranasal sinuses are otherwise clear. The mastoid air cells are unopacified. Soft tissues: Soft tissue swelling/laceration overlying the left frontal bone (series 3/image 81). Additional preseptal soft tissue swelling overlying the lateral left orbit (series 3/image 75). CT CERVICAL SPINE FINDINGS Alignment: Mild straightening of  the cervical spine, likely positional. Skull base and vertebrae: No acute  fracture. No primary bone lesion or focal pathologic process. Soft tissues and spinal canal: No prevertebral fluid or swelling. No visible canal hematoma. Disc levels: Mild degenerative changes of the mid cervical spine. Spinal canal is patent. Upper chest: Visualized lung apices are clear. Other: Visualized thyroid is unremarkable. IMPRESSION: Soft tissue swelling/laceration overlying the left frontal bone. Additional preseptal soft tissue swelling overlying the lateral left orbit. No acute intracranial abnormality. Atrophy with small vessel ischemic changes. No evidence of maxillofacial fracture. No traumatic injury to the cervical spine. Mild degenerative changes. Electronically Signed   By: Charline Bills M.D.   On: 12/11/2022 21:34   DG Pelvis Portable  Result Date: 12/11/2022 CLINICAL DATA:  Status post fall. EXAM: PORTABLE PELVIS 1-2 VIEWS COMPARISON:  December 10, 2022 FINDINGS: There is no evidence of pelvic fracture or diastasis. Degenerative changes seen involving both hips in the form of joint space narrowing, acetabular sclerosis and lateral acetabular bony spurring. No pelvic bone lesions are seen. IMPRESSION: Degenerative changes without evidence of an acute osseous abnormality. Electronically Signed   By: Aram Candela M.D.   On: 12/11/2022 21:18   DG Chest Port 1 View  Result Date: 12/11/2022 CLINICAL DATA:  Fall. EXAM: PORTABLE CHEST 1 VIEW COMPARISON:  December 10, 2022 FINDINGS: The heart size and mediastinal contours are within normal limits. Both lungs are clear. The visualized skeletal structures are unremarkable. IMPRESSION: No active disease. Electronically Signed   By: Lupita Raider M.D.   On: 12/11/2022 21:13   CT Head Wo Contrast  Result Date: 12/10/2022 CLINICAL DATA:  77 year old male with history of unwitnessed fall. Laceration to left eyebrow. EXAM: CT HEAD WITHOUT CONTRAST CT MAXILLOFACIAL WITHOUT CONTRAST CT CERVICAL SPINE WITHOUT CONTRAST TECHNIQUE: Multidetector CT  imaging of the head, cervical spine, and maxillofacial structures were performed using the standard protocol without intravenous contrast. Multiplanar CT image reconstructions of the cervical spine and maxillofacial structures were also generated. RADIATION DOSE REDUCTION: This exam was performed according to the departmental dose-optimization program which includes automated exposure control, adjustment of the mA and/or kV according to patient size and/or use of iterative reconstruction technique. COMPARISON:  CT of the orbits 12/09/2022. Head CT and cervical spine CT 12/04/2022. FINDINGS: CT HEAD FINDINGS Brain: Moderate cerebral and mild cerebellar atrophy. Patchy and confluent areas of decreased attenuation are noted throughout the deep and periventricular white matter of the cerebral hemispheres bilaterally, compatible with chronic microvascular ischemic disease. No evidence of acute infarction, hemorrhage, hydrocephalus, extra-axial collection or mass lesion/mass effect. Vascular: No hyperdense vessel or unexpected calcification. Skull: Normal. Negative for fracture or focal lesion. Other: Left periorbital soft tissue swelling similar to recent orbital CT examination. CT MAXILLOFACIAL FINDINGS Osseous: No fracture or mandibular dislocation. No destructive process. Orbits: Periorbital soft tissue swelling on the left, similar to recent orbital CT scan. Left globe and retro-orbital soft tissues are grossly normal in appearance. Right globe and orbit are normal. Sinuses: Mild multifocal mucosal thickening in the paranasal sinuses, most evident along the medial wall of the left maxillary sinus. No air-fluid levels are noted. No hemosinus. Soft tissues: Periorbital soft tissue swelling on the left. CT CERVICAL SPINE FINDINGS Alignment: Normal. Skull base and vertebrae: No acute fracture. No primary bone lesion or focal pathologic process. Soft tissues and spinal canal: No prevertebral fluid or swelling. No visible  canal hematoma. Disc levels: Multilevel degenerative disc disease, most pronounced at C4-C5, C5-C6 and C6-C7. Moderate multilevel facet  arthropathy bilaterally. Upper chest: Incompletely imaged nodular area of architectural distortion in the right upper lobe (axial image 92 of series 3) estimated to measure approximately 1.3 x 0.7 cm. Other: None. IMPRESSION: 1. Periorbital soft tissue swelling on the left, similar to yesterday's contrast-enhanced orbital CT scan. Please see that report for full description. 2. No acute displaced fracture of the skull, facial bones or cervical spine. 3. No acute intracranial abnormalities. 4. Moderate cerebral and mild cerebellar atrophy with extensive chronic microvascular ischemic changes in the cerebral white matter, as above. 5. Mild multilevel degenerative disc disease and cervical spondylosis, as above. 6. Incompletely imaged nodular area of architectural distortion in the right upper lobe estimated to measure approximately 1.3 x 0.7 cm. Further evaluation with nonemergent outpatient noncontrast chest CT is recommended in the near future to exclude the possibility of underlying neoplasm. Electronically Signed   By: Trudie Reed M.D.   On: 12/10/2022 07:42   CT Maxillofacial Wo Contrast  Result Date: 12/10/2022 CLINICAL DATA:  77 year old male with history of unwitnessed fall. Laceration to left eyebrow. EXAM: CT HEAD WITHOUT CONTRAST CT MAXILLOFACIAL WITHOUT CONTRAST CT CERVICAL SPINE WITHOUT CONTRAST TECHNIQUE: Multidetector CT imaging of the head, cervical spine, and maxillofacial structures were performed using the standard protocol without intravenous contrast. Multiplanar CT image reconstructions of the cervical spine and maxillofacial structures were also generated. RADIATION DOSE REDUCTION: This exam was performed according to the departmental dose-optimization program which includes automated exposure control, adjustment of the mA and/or kV according to patient  size and/or use of iterative reconstruction technique. COMPARISON:  CT of the orbits 12/09/2022. Head CT and cervical spine CT 12/04/2022. FINDINGS: CT HEAD FINDINGS Brain: Moderate cerebral and mild cerebellar atrophy. Patchy and confluent areas of decreased attenuation are noted throughout the deep and periventricular white matter of the cerebral hemispheres bilaterally, compatible with chronic microvascular ischemic disease. No evidence of acute infarction, hemorrhage, hydrocephalus, extra-axial collection or mass lesion/mass effect. Vascular: No hyperdense vessel or unexpected calcification. Skull: Normal. Negative for fracture or focal lesion. Other: Left periorbital soft tissue swelling similar to recent orbital CT examination. CT MAXILLOFACIAL FINDINGS Osseous: No fracture or mandibular dislocation. No destructive process. Orbits: Periorbital soft tissue swelling on the left, similar to recent orbital CT scan. Left globe and retro-orbital soft tissues are grossly normal in appearance. Right globe and orbit are normal. Sinuses: Mild multifocal mucosal thickening in the paranasal sinuses, most evident along the medial wall of the left maxillary sinus. No air-fluid levels are noted. No hemosinus. Soft tissues: Periorbital soft tissue swelling on the left. CT CERVICAL SPINE FINDINGS Alignment: Normal. Skull base and vertebrae: No acute fracture. No primary bone lesion or focal pathologic process. Soft tissues and spinal canal: No prevertebral fluid or swelling. No visible canal hematoma. Disc levels: Multilevel degenerative disc disease, most pronounced at C4-C5, C5-C6 and C6-C7. Moderate multilevel facet arthropathy bilaterally. Upper chest: Incompletely imaged nodular area of architectural distortion in the right upper lobe (axial image 92 of series 3) estimated to measure approximately 1.3 x 0.7 cm. Other: None. IMPRESSION: 1. Periorbital soft tissue swelling on the left, similar to yesterday's  contrast-enhanced orbital CT scan. Please see that report for full description. 2. No acute displaced fracture of the skull, facial bones or cervical spine. 3. No acute intracranial abnormalities. 4. Moderate cerebral and mild cerebellar atrophy with extensive chronic microvascular ischemic changes in the cerebral white matter, as above. 5. Mild multilevel degenerative disc disease and cervical spondylosis, as above. 6. Incompletely imaged nodular area  of architectural distortion in the right upper lobe estimated to measure approximately 1.3 x 0.7 cm. Further evaluation with nonemergent outpatient noncontrast chest CT is recommended in the near future to exclude the possibility of underlying neoplasm. Electronically Signed   By: Trudie Reed M.D.   On: 12/10/2022 07:42   CT Cervical Spine Wo Contrast  Result Date: 12/10/2022 CLINICAL DATA:  77 year old male with history of unwitnessed fall. Laceration to left eyebrow. EXAM: CT HEAD WITHOUT CONTRAST CT MAXILLOFACIAL WITHOUT CONTRAST CT CERVICAL SPINE WITHOUT CONTRAST TECHNIQUE: Multidetector CT imaging of the head, cervical spine, and maxillofacial structures were performed using the standard protocol without intravenous contrast. Multiplanar CT image reconstructions of the cervical spine and maxillofacial structures were also generated. RADIATION DOSE REDUCTION: This exam was performed according to the departmental dose-optimization program which includes automated exposure control, adjustment of the mA and/or kV according to patient size and/or use of iterative reconstruction technique. COMPARISON:  CT of the orbits 12/09/2022. Head CT and cervical spine CT 12/04/2022. FINDINGS: CT HEAD FINDINGS Brain: Moderate cerebral and mild cerebellar atrophy. Patchy and confluent areas of decreased attenuation are noted throughout the deep and periventricular white matter of the cerebral hemispheres bilaterally, compatible with chronic microvascular ischemic disease.  No evidence of acute infarction, hemorrhage, hydrocephalus, extra-axial collection or mass lesion/mass effect. Vascular: No hyperdense vessel or unexpected calcification. Skull: Normal. Negative for fracture or focal lesion. Other: Left periorbital soft tissue swelling similar to recent orbital CT examination. CT MAXILLOFACIAL FINDINGS Osseous: No fracture or mandibular dislocation. No destructive process. Orbits: Periorbital soft tissue swelling on the left, similar to recent orbital CT scan. Left globe and retro-orbital soft tissues are grossly normal in appearance. Right globe and orbit are normal. Sinuses: Mild multifocal mucosal thickening in the paranasal sinuses, most evident along the medial wall of the left maxillary sinus. No air-fluid levels are noted. No hemosinus. Soft tissues: Periorbital soft tissue swelling on the left. CT CERVICAL SPINE FINDINGS Alignment: Normal. Skull base and vertebrae: No acute fracture. No primary bone lesion or focal pathologic process. Soft tissues and spinal canal: No prevertebral fluid or swelling. No visible canal hematoma. Disc levels: Multilevel degenerative disc disease, most pronounced at C4-C5, C5-C6 and C6-C7. Moderate multilevel facet arthropathy bilaterally. Upper chest: Incompletely imaged nodular area of architectural distortion in the right upper lobe (axial image 92 of series 3) estimated to measure approximately 1.3 x 0.7 cm. Other: None. IMPRESSION: 1. Periorbital soft tissue swelling on the left, similar to yesterday's contrast-enhanced orbital CT scan. Please see that report for full description. 2. No acute displaced fracture of the skull, facial bones or cervical spine. 3. No acute intracranial abnormalities. 4. Moderate cerebral and mild cerebellar atrophy with extensive chronic microvascular ischemic changes in the cerebral white matter, as above. 5. Mild multilevel degenerative disc disease and cervical spondylosis, as above. 6. Incompletely imaged  nodular area of architectural distortion in the right upper lobe estimated to measure approximately 1.3 x 0.7 cm. Further evaluation with nonemergent outpatient noncontrast chest CT is recommended in the near future to exclude the possibility of underlying neoplasm. Electronically Signed   By: Trudie Reed M.D.   On: 12/10/2022 07:42   DG Chest 1 View  Result Date: 12/10/2022 CLINICAL DATA:  Nonverbal patient with unwitnessed fall at nursing home. EXAM: CHEST  1 VIEW; PELVIS - 1-2 VIEW COMPARISON:  PA and lateral chest 06/30/2022, AP pelvis 06/30/2022. FINDINGS: Chest AP portable at 6:09 a.m.: The cardiomediastinal silhouette and vascular pattern are normal. There is  calcific plaque in the aortic arch. The lungs are clear. No pleural effusion is seen. There is no visible pneumothorax. There is osteopenia. No displaced rib fracture is evident. AP pelvis, single view: There is no evidence of pelvic fracture or diastasis. No pelvic bone lesions are seen. There is mild symmetric arthrosis of the hips, mild enthesopathic changes of the pelvis. There is aortoiliac calcific plaque. Moderate retained stool in the rectum. Compare: Both of the above studies are unchanged compared to the previous exams. IMPRESSION: 1. No evidence of acute chest disease. 2. No evidence of pelvic fracture or diastasis. 3. Aortoiliac calcific plaque. 4. Moderate retained stool in the rectum. Electronically Signed   By: Almira Bar M.D.   On: 12/10/2022 06:18   DG Pelvis 1-2 Views  Result Date: 12/10/2022 CLINICAL DATA:  Nonverbal patient with unwitnessed fall at nursing home. EXAM: CHEST  1 VIEW; PELVIS - 1-2 VIEW COMPARISON:  PA and lateral chest 06/30/2022, AP pelvis 06/30/2022. FINDINGS: Chest AP portable at 6:09 a.m.: The cardiomediastinal silhouette and vascular pattern are normal. There is calcific plaque in the aortic arch. The lungs are clear. No pleural effusion is seen. There is no visible pneumothorax. There is osteopenia.  No displaced rib fracture is evident. AP pelvis, single view: There is no evidence of pelvic fracture or diastasis. No pelvic bone lesions are seen. There is mild symmetric arthrosis of the hips, mild enthesopathic changes of the pelvis. There is aortoiliac calcific plaque. Moderate retained stool in the rectum. Compare: Both of the above studies are unchanged compared to the previous exams. IMPRESSION: 1. No evidence of acute chest disease. 2. No evidence of pelvic fracture or diastasis. 3. Aortoiliac calcific plaque. 4. Moderate retained stool in the rectum. Electronically Signed   By: Almira Bar M.D.   On: 12/10/2022 06:18    Procedures Procedures    Medications Ordered in ED Medications - No data to display  ED Course/ Medical Decision Making/ A&P                                 Medical Decision Making Amount and/or Complexity of Data Reviewed Radiology: ordered.   Patient presents as outlined.  He has severe dementia and recurrent falls.  Today he has fallen on his face.  Patient previously had significant D-Hist brow laceration.  That persist.  He now also has diffuse swelling of the nasal bridge with some nasal bleeding but not persistent.  Will proceed with CT head and C-spine.  With patient's fall possible rib fracture or pelvis fracture.  Will also get plain films for trauma  CT head reviewed by radiology no acute findings.  Facial bones no acute underlying fractures.  C-spine no acute fractures.  Chest x-ray and pelvis no acute fractures.  At this point patient has dehisced and previously infected brow laceration.  This will require local wound care.  Patient has a minimal laceration over the bridge of the nose with no active bleeding or gaping.  No suturing indicated.  He also has a small mucosal upper lip laceration that would not benefit from closure at this time.  Local wound care appropriate.  Will return to SNF.Patient with severe dementia and ongoing fall  risk.        Final Clinical Impression(s) / ED Diagnoses Final diagnoses:  Fall, initial encounter  Contusion of face, initial encounter  Lip laceration, initial encounter  Severe dementia, unspecified dementia type,  unspecified whether behavioral, psychotic, or mood disturbance or anxiety [facility])    Rx / DC Orders ED Discharge Orders     None         Arby Barrette, MD 12/11/22 2232

## 2022-12-11 NOTE — ED Notes (Addendum)
[facility] arranged for transportation. [facility] called for update.

## 2022-12-11 NOTE — ED Notes (Signed)
RN talked to pt daughter in law and gave update.

## 2022-12-11 NOTE — ED Notes (Signed)
Bed placed in lowest position and wheels are locked. Bed alarm placed on pt's bed.

## 2022-12-11 NOTE — ED Notes (Signed)
[facility] was concerned about pt right ring finger from a previous fall and wanted a splint on finger. RN placed a finger splint before going back to the facility.

## 2022-12-11 NOTE — ED Triage Notes (Signed)
Pt BIB EMS from [facility] for a fall. Pt is prone to falls and just had one Monday. Pt presents with facial injuries to the nose and mouth. Pt A&Ox1 according to EMS.

## 2022-12-11 NOTE — ED Notes (Addendum)
RN called [facility] to give report on patient.

## 2022-12-11 NOTE — Discharge Instructions (Signed)
1.  CT scans did not show any bleeding around the brain or broken bones in the face or neck. 2.  Try to continue cleaning wounds once or twice a day as much as possible and applying antibiotic ointment. 3.  Continue previously prescribed antibiotics and Tylenol for pain.

## 2022-12-11 NOTE — ED Notes (Addendum)
[facility] RN and [employer] RN cleaned pt face with wound cleanser and gauze. Bandaid placed on pt nose.

## 2022-12-22 ENCOUNTER — Encounter (HOSPITAL_COMMUNITY): Payer: Self-pay

## 2022-12-22 ENCOUNTER — Emergency Department (HOSPITAL_COMMUNITY)
Admission: EM | Admit: 2022-12-22 | Discharge: 2022-12-22 | Disposition: A | Discharge status: Home / Self Care | Attending: Emergency Medicine | Admitting: Emergency Medicine

## 2022-12-22 ENCOUNTER — Other Ambulatory Visit: Payer: Self-pay

## 2022-12-22 DIAGNOSIS — W0110XA Fall on same level from slipping, tripping and stumbling with subsequent striking against unspecified object, initial encounter: Secondary | ICD-10-CM | POA: Insufficient documentation

## 2022-12-22 DIAGNOSIS — Y9301 Activity, walking, marching and hiking: Secondary | ICD-10-CM | POA: Insufficient documentation

## 2022-12-22 DIAGNOSIS — F0390 Unspecified dementia without behavioral disturbance: Secondary | ICD-10-CM | POA: Insufficient documentation

## 2022-12-22 DIAGNOSIS — W010XXA Fall on same level from slipping, tripping and stumbling without subsequent striking against object, initial encounter: Secondary | ICD-10-CM

## 2022-12-22 DIAGNOSIS — S0083XA Contusion of other part of head, initial encounter: Secondary | ICD-10-CM

## 2022-12-22 DIAGNOSIS — S01111A Laceration without foreign body of right eyelid and periocular area, initial encounter: Secondary | ICD-10-CM | POA: Diagnosis present

## 2022-12-22 MED ORDER — LIDOCAINE-EPINEPHRINE (PF) 2 %-1:200000 IJ SOLN
20.0000 mL | Freq: Once | INTRAMUSCULAR | Status: AC
  Administered 2022-12-22: 20 mL
  Filled 2022-12-22: qty 20

## 2022-12-22 NOTE — ED Provider Notes (Signed)
[place] EMERGENCY DEPARTMENT AT [facility] Provider Note   CSN: [id number] Arrival date & time: 12/22/22  1006     History  Chief Complaint  Patient presents with   Marletta Lor    Trevor Cook is a 77 y.o. male.  Pt with c/o fall at SNF. Pt with advanced dementia and frequent falls. No loc. Pts mental status and functional ability described as c/w baseline post fall. No anticoagulant use. No vomiting. Pt limited historian, dementia, level 5 caveat.   The history is provided by the patient, medical records and the EMS personnel. The history is limited by the condition of the patient.  Fall       Home Medications Prior to Admission medications   Medication Sig Start Date End Date Taking? Authorizing Provider  acetaminophen (TYLENOL) 325 MG tablet Take 2 tablets (650 mg total) by mouth every 6 (six) hours as needed for mild pain (or Fever >/= 101). 07/04/21   Rhetta Mura, MD  amoxicillin-clavulanate (AUGMENTIN) 875-125 MG tablet Take 1 tablet by mouth every 12 (twelve) hours. Patient not taking: Reported on 12/09/2022 11/01/22   Gwyneth Sprout, MD  bisacodyl (DULCOLAX) 10 MG suppository Place 1 suppository (10 mg total) rectally daily as needed for moderate constipation. 05/07/22   Jerald Kief, MD  carbidopa-levodopa (SINEMET IR) 25-100 MG tablet Take 0.5 tablets by mouth 3 (three) times daily. 07/10/21   Rhetta Mura, MD  clindamycin (CLEOCIN) 300 MG capsule Take 1 capsule (300 mg total) by mouth every 6 (six) hours. 12/09/22   Prosperi, Christian H, PA-C  divalproex (DEPAKOTE SPRINKLE) 125 MG capsule Take 3 capsules (375 mg total) by mouth 2 (two) times daily. Taken at 4pm and 10pm Patient not taking: Reported on 12/09/2022 05/07/22 12/09/22  Jerald Kief, MD  divalproex (DEPAKOTE SPRINKLE) 125 MG capsule Take 4 capsules (500 mg total) by mouth daily. Taken at 10am Patient taking differently: Take 375-500 mg by mouth See admin instructions. Take 500 mg by mouth  at 10 AM every day and 375 mg at 4 PM & 10 PM 05/08/22 12/11/22  Jerald Kief, MD  doxycycline (ADOXA) 100 MG tablet Take 100 mg by mouth 2 (two) times daily. For 10 days.    [provider]  levothyroxine (SYNTHROID) 75 MCG tablet Take 75 mcg by mouth daily before breakfast. 06/15/21   [provider]  NON FORMULARY Take 1 Can by mouth See admin instructions. Mighty Shake- Drink 1 shake by mouth three times a day    [provider]  OLANZapine (ZYPREXA) 15 MG tablet Take 7.5 mg by mouth 2 (two) times daily. 02/14/22   [provider]  polyethylene glycol (MIRALAX / GLYCOLAX) 17 g packet Take 17 g by mouth daily. Patient not taking: Reported on 12/09/2022 05/08/22   Jerald Kief, MD  thiamine 100 MG tablet Take 1 tablet (100 mg total) by mouth daily. 05/17/20   Joseph Art, DO  zinc oxide 20 % ointment Apply 1 Application topically See admin instructions. Apply as directed to the buttocks every shift    [provider]      Allergies    Patient has no known allergies.    Review of Systems   Review of Systems  Unable to perform ROS: Dementia    Physical Exam Updated Vital Signs BP 133/81   Pulse (!) 54   Resp 15   Ht 1.753 m (5\' 9" )   Wt 68 kg   SpO2 100%  BMI 22.15 kg/m  Physical Exam Vitals and nursing note reviewed.  Constitutional:      Appearance: Normal appearance. He is well-developed.  HENT:     Head:     Comments: Prior old lac left eyebrow without acute infection. New laceration to right eyebrow, irregular, not actively bleeding.     Nose: Nose normal.     Mouth/Throat:     Mouth: Mucous membranes are moist.     Pharynx: Oropharynx is clear.  Eyes:     General: No scleral icterus.    Conjunctiva/sclera: Conjunctivae normal.     Pupils: Pupils are equal, round, and reactive to light.  Neck:     Trachea: No tracheal deviation.  Cardiovascular:     Rate and Rhythm: Normal rate and regular rhythm.     Pulses: Normal  pulses.     Heart sounds: Normal heart sounds. No murmur heard.    No friction rub. No gallop.  Pulmonary:     Effort: Pulmonary effort is normal. No accessory muscle usage or respiratory distress.     Breath sounds: Normal breath sounds.  Chest:     Chest wall: No tenderness.  Abdominal:     General: There is no distension.     Palpations: Abdomen is soft.     Tenderness: There is no abdominal tenderness.     Comments: No abd contusion or bruising  Musculoskeletal:        General: No swelling.     Cervical back: Normal range of motion and neck supple. No rigidity or tenderness.     Comments: CTLS spine, non tender, aligned, no step off. Good rom bil extremities without pain or focal bony tenderness.   Skin:    General: Skin is warm and dry.     Findings: No rash.  Neurological:     Mental Status: He is alert.     Comments: Alert, responsive. Moves bil extremities purposefully.      ED Results / Procedures / Treatments   Labs (all labs ordered are listed, but only abnormal results are displayed) Labs Reviewed - No data to display  EKG None  Radiology No results found.  Procedures .Marland KitchenLaceration Repair  Date/Time: 12/22/2022 12:22 PM  Performed by: Cathren Laine, MD Authorized by: Cathren Laine, MD   Consent:    Consent given by:  Patient Anesthesia:    Anesthesia method:  Local infiltration   Local anesthetic:  Lidocaine 2% WITH epi Laceration details:    Location:  Face   Face location:  R eyebrow   Length (cm):  5 Pre-procedure details:    Preparation:  Patient was prepped and draped in usual sterile fashion Exploration:    Imaging outcome: foreign body not noted     Wound exploration: entire depth of wound visualized   Treatment:    Area cleansed with:  Saline   Amount of cleaning:  Standard   Irrigation solution:  Sterile water   Visualized foreign bodies/material removed: no     Layers/structures repaired:  Deep subcutaneous Deep subcutaneous:     Suture size:  4-0   Suture material:  Vicryl   Suture technique:  Simple interrupted   Number of sutures:  2 Skin repair:    Repair method:  Sutures   Suture size:  5-0   Suture material:  Prolene   Number of sutures:  12 Repair type:    Repair type:  Intermediate Post-procedure details:    Dressing:  Antibiotic ointment and sterile dressing  Procedure completion:  Tolerated well, no immediate complications     Medications Ordered in ED Medications  lidocaine-EPINEPHrine (XYLOCAINE W/EPI) 2 %-1:200000 (PF) injection 20 mL (has no administration in time range)    ED Course/ Medical Decision Making/ A&P                                 Medical Decision Making Problems Addressed: Contusion of face, initial encounter: acute illness or injury with systemic symptoms Fall from slip, trip, or stumble, initial encounter: acute illness or injury with systemic symptoms that poses a threat to life or bodily functions Laceration of right eyebrow, initial encounter: acute illness or injury  Amount and/or Complexity of Data Reviewed Independent Historian: EMS    Details: hx External Data Reviewed: notes. Labs:  Decision-making details documented in ED Course. Radiology: independent interpretation performed. Decision-making details documented in ED Course.  Risk Prescription drug management.   Reviewed nursing notes and prior charts for additional history. Pt with 3 prior cts of head/neck/face in past three weeks.   Recent CT reviewed/interpreted by me - no fx/hem then.   Recent labs reviewed/interpreted by me - depakote level not elevated.   Laceration repair.   Tetanus is up to date.  Pt content appearing, no apparent pain or discomfort. Pt appears stable for d/c.  Rec fall precautions at facility.           Final Clinical Impression(s) / ED Diagnoses Final diagnoses:  None    Rx / DC Orders ED Discharge Orders     None         Cathren Laine,  MD 12/22/22 1227

## 2022-12-22 NOTE — ED Notes (Signed)
Attempted to call [facility] for report, NA x 1

## 2022-12-22 NOTE — ED Notes (Signed)
Transfer of care report give to transport team. Pt well appearing upon discharge.

## 2022-12-22 NOTE — Discharge Instructions (Signed)
It was our pleasure to provide your ER care today - we hope that you feel better.  Fall precautions - patient must have assistance if/when up or out of bed.   Keep sutured wound very clean - have sutures removed, your doctor or urgent care, in 10-12 days.  Return to ER if worse, new symptoms, infection of wound, new/severe pain, persistent vomiting, high fevers, or other concern.

## 2022-12-22 NOTE — ED Notes (Signed)
Attempted to call [facility] for report, NA x 3

## 2022-12-22 NOTE — ED Triage Notes (Signed)
Pt arrived via ems from [facility] for the c/o witnessed fall today. VSS. Pt was walking, got tripped up, and hit his head on the floor. Laceration noted above left eye - skin tear noted above right eye , pt did not lose consciousness, not on blood thinners.

## 2022-12-22 NOTE — ED Notes (Signed)
PT well appearing, NAD noted. Resting in gurney

## 2022-12-22 NOTE — ED Notes (Signed)
Attempted to call [facility] for report, NA x 2

## 2022-12-22 NOTE — Progress Notes (Signed)
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     Please call with any questions/concerns.    Thank you for the opportunity to participate in this patient's care.   Eugenie Birks, MSW [facility] Liaison  [phone]

## 2023-01-05 DEATH — deceased
